# Patient Record
Sex: Female | Born: 1960 | Race: White | Hispanic: No | Marital: Single | Smoking: Former smoker
Health system: Southern US, Community
[De-identification: ages and names within clinical notes are randomized; demographics above are authoritative.]

## PROBLEM LIST (undated history)

## (undated) DIAGNOSIS — D751 Secondary polycythemia: Secondary | ICD-10-CM

## (undated) DIAGNOSIS — J449 Chronic obstructive pulmonary disease, unspecified: Secondary | ICD-10-CM

## (undated) DIAGNOSIS — J302 Other seasonal allergic rhinitis: Secondary | ICD-10-CM

## (undated) DIAGNOSIS — E559 Vitamin D deficiency, unspecified: Secondary | ICD-10-CM

## (undated) HISTORY — DX: Secondary polycythemia: D75.1

## (undated) HISTORY — DX: Other seasonal allergic rhinitis: J30.2

## (undated) HISTORY — DX: Vitamin D deficiency, unspecified: E55.9

---

## 2017-12-27 DIAGNOSIS — J101 Influenza due to other identified influenza virus with other respiratory manifestations: Secondary | ICD-10-CM

## 2017-12-27 HISTORY — DX: Influenza due to other identified influenza virus with other respiratory manifestations: J10.1

## 2018-01-04 ENCOUNTER — Emergency Department (HOSPITAL_COMMUNITY): Payer: Self-pay

## 2018-01-04 ENCOUNTER — Inpatient Hospital Stay (HOSPITAL_COMMUNITY)
Admission: EM | Admit: 2018-01-04 | Discharge: 2018-01-10 | DRG: 193 | Disposition: A | Discharge status: Home / Self Care | Payer: Self-pay | Attending: Internal Medicine | Admitting: Internal Medicine

## 2018-01-04 ENCOUNTER — Encounter (HOSPITAL_COMMUNITY): Payer: Self-pay | Admitting: Emergency Medicine

## 2018-01-04 ENCOUNTER — Other Ambulatory Visit: Payer: Self-pay

## 2018-01-04 DIAGNOSIS — R740 Nonspecific elevation of levels of transaminase and lactic acid dehydrogenase [LDH]: Secondary | ICD-10-CM

## 2018-01-04 DIAGNOSIS — J449 Chronic obstructive pulmonary disease, unspecified: Secondary | ICD-10-CM

## 2018-01-04 DIAGNOSIS — J1008 Influenza due to other identified influenza virus with other specified pneumonia: Principal | ICD-10-CM | POA: Diagnosis present

## 2018-01-04 DIAGNOSIS — J101 Influenza due to other identified influenza virus with other respiratory manifestations: Secondary | ICD-10-CM | POA: Diagnosis present

## 2018-01-04 DIAGNOSIS — J9621 Acute and chronic respiratory failure with hypoxia: Secondary | ICD-10-CM | POA: Diagnosis present

## 2018-01-04 DIAGNOSIS — J9601 Acute respiratory failure with hypoxia: Secondary | ICD-10-CM | POA: Diagnosis present

## 2018-01-04 DIAGNOSIS — J111 Influenza due to unidentified influenza virus with other respiratory manifestations: Secondary | ICD-10-CM

## 2018-01-04 DIAGNOSIS — J440 Chronic obstructive pulmonary disease with acute lower respiratory infection: Secondary | ICD-10-CM | POA: Diagnosis present

## 2018-01-04 DIAGNOSIS — J189 Pneumonia, unspecified organism: Secondary | ICD-10-CM

## 2018-01-04 DIAGNOSIS — J441 Chronic obstructive pulmonary disease with (acute) exacerbation: Secondary | ICD-10-CM

## 2018-01-04 DIAGNOSIS — J181 Lobar pneumonia, unspecified organism: Secondary | ICD-10-CM

## 2018-01-04 DIAGNOSIS — K8020 Calculus of gallbladder without cholecystitis without obstruction: Secondary | ICD-10-CM | POA: Diagnosis present

## 2018-01-04 DIAGNOSIS — Z87891 Personal history of nicotine dependence: Secondary | ICD-10-CM

## 2018-01-04 DIAGNOSIS — R0902 Hypoxemia: Secondary | ICD-10-CM

## 2018-01-04 DIAGNOSIS — R7401 Elevation of levels of liver transaminase levels: Secondary | ICD-10-CM

## 2018-01-04 DIAGNOSIS — R7989 Other specified abnormal findings of blood chemistry: Secondary | ICD-10-CM | POA: Diagnosis present

## 2018-01-04 DIAGNOSIS — R945 Abnormal results of liver function studies: Secondary | ICD-10-CM

## 2018-01-04 LAB — COMPREHENSIVE METABOLIC PANEL
ALT: 111 U/L — ABNORMAL HIGH (ref 14–54)
AST: 62 U/L — ABNORMAL HIGH (ref 15–41)
Albumin: 3.1 g/dL — ABNORMAL LOW (ref 3.5–5.0)
Alkaline Phosphatase: 88 U/L (ref 38–126)
Anion gap: 15 (ref 5–15)
BUN: 25 mg/dL — ABNORMAL HIGH (ref 6–20)
CO2: 29 mmol/L (ref 22–32)
Calcium: 9.5 mg/dL (ref 8.9–10.3)
Chloride: 99 mmol/L — ABNORMAL LOW (ref 101–111)
Creatinine, Ser: 0.7 mg/dL (ref 0.44–1.00)
GFR calc Af Amer: >60 mL/min (ref >60)
GFR calc non Af Amer: >60 mL/min (ref >60)
Glucose, Bld: 117 mg/dL — ABNORMAL HIGH (ref 65–99)
Potassium: 4 mmol/L (ref 3.5–5.1)
Sodium: 143 mmol/L (ref 135–145)
Total Bilirubin: 0.8 mg/dL (ref 0.3–1.2)
Total Protein: 8.1 g/dL (ref 6.5–8.1)

## 2018-01-04 LAB — CBC WITH DIFFERENTIAL/PLATELET
Basophils Absolute: 0.2 10*3/uL — ABNORMAL HIGH (ref 0.0–0.1)
Basophils Relative: 1 %
Eosinophils Absolute: 0 10*3/uL (ref 0.0–0.7)
Eosinophils Relative: 0 %
HCT: 46.6 % — ABNORMAL HIGH (ref 36.0–46.0)
Hemoglobin: 15.1 g/dL — ABNORMAL HIGH (ref 12.0–15.0)
Lymphocytes Relative: 21 %
Lymphs Abs: 3.6 10*3/uL (ref 0.7–4.0)
MCH: 32.1 pg (ref 26.0–34.0)
MCHC: 32.4 g/dL (ref 30.0–36.0)
MCV: 98.9 fL (ref 78.0–100.0)
Monocytes Absolute: 1.2 10*3/uL — ABNORMAL HIGH (ref 0.1–1.0)
Monocytes Relative: 7 %
Neutro Abs: 12.1 10*3/uL — ABNORMAL HIGH (ref 1.7–7.7)
Neutrophils Relative %: 71 %
Platelets: 267 10*3/uL (ref 150–400)
RBC: 4.71 MIL/uL (ref 3.87–5.11)
RDW: 12.4 % (ref 11.5–15.5)
WBC: 17.1 10*3/uL — ABNORMAL HIGH (ref 4.0–10.5)

## 2018-01-04 LAB — BLOOD GAS, ARTERIAL
Acid-Base Excess: 6 mmol/L — ABNORMAL HIGH (ref 0.0–2.0)
Bicarbonate: 29 mmol/L — ABNORMAL HIGH (ref 20.0–28.0)
Drawn by: 234301
FIO2: 21
O2 Saturation: 81.7 %
Patient temperature: 37
pCO2 arterial: 45.2 mmHg (ref 32.0–48.0)
pH, Arterial: 7.44 (ref 7.350–7.450)
pO2, Arterial: 47.1 mmHg — ABNORMAL LOW (ref 83.0–108.0)

## 2018-01-04 LAB — URINALYSIS, ROUTINE W REFLEX MICROSCOPIC
Bilirubin Urine: NEGATIVE
Glucose, UA: NEGATIVE mg/dL
Hgb urine dipstick: NEGATIVE
Ketones, ur: 20 mg/dL — AB
Leukocytes, UA: NEGATIVE
Nitrite: NEGATIVE
Protein, ur: 30 mg/dL — AB
Specific Gravity, Urine: 1.023 (ref 1.005–1.030)
pH: 6 (ref 5.0–8.0)

## 2018-01-04 LAB — I-STAT CG4 LACTIC ACID, ED: Lactic Acid, Venous: 1.13 mmol/L (ref 0.5–1.9)

## 2018-01-04 LAB — INFLUENZA PANEL BY PCR (TYPE A & B)
Influenza A By PCR: NEGATIVE
Influenza B By PCR: POSITIVE — AB

## 2018-01-04 MED ORDER — ALBUTEROL SULFATE (2.5 MG/3ML) 0.083% IN NEBU
2.5000 mg | INHALATION_SOLUTION | Freq: Once | RESPIRATORY_TRACT | Status: AC
  Administered 2018-01-04: 2.5 mg via RESPIRATORY_TRACT

## 2018-01-04 MED ORDER — SODIUM CHLORIDE 0.9 % IV BOLUS (SEPSIS)
1000.0000 mL | Freq: Once | INTRAVENOUS | Status: AC
  Administered 2018-01-04: 1000 mL via INTRAVENOUS

## 2018-01-04 MED ORDER — SODIUM CHLORIDE 0.9% FLUSH
3.0000 mL | Freq: Two times a day (BID) | INTRAVENOUS | Status: DC
  Administered 2018-01-04 – 2018-01-10 (×10): 3 mL via INTRAVENOUS

## 2018-01-04 MED ORDER — SODIUM CHLORIDE 0.9 % IV SOLN: 1000.0000 mL | INTRAVENOUS | Status: DC

## 2018-01-04 MED ORDER — DEXTROSE 5 % IV SOLN
500.0000 mg | Freq: Once | INTRAVENOUS | Status: AC
  Administered 2018-01-04: 500 mg via INTRAVENOUS
  Filled 2018-01-04: qty 500

## 2018-01-04 MED ORDER — IPRATROPIUM-ALBUTEROL 0.5-2.5 (3) MG/3ML IN SOLN
RESPIRATORY_TRACT | Status: AC
  Administered 2018-01-04: 3 mL via RESPIRATORY_TRACT
  Filled 2018-01-04: qty 3

## 2018-01-04 MED ORDER — ALBUTEROL SULFATE (2.5 MG/3ML) 0.083% IN NEBU
INHALATION_SOLUTION | RESPIRATORY_TRACT | Status: AC
  Administered 2018-01-04: 2.5 mg via RESPIRATORY_TRACT
  Filled 2018-01-04: qty 3

## 2018-01-04 MED ORDER — DEXTROSE 5 % IV SOLN
500.0000 mg | INTRAVENOUS | Status: DC
  Administered 2018-01-05 – 2018-01-07 (×3): 500 mg via INTRAVENOUS
  Filled 2018-01-04 (×4): qty 500

## 2018-01-04 MED ORDER — SODIUM CHLORIDE 0.9% FLUSH: 3.0000 mL | INTRAVENOUS | Status: DC | PRN

## 2018-01-04 MED ORDER — IPRATROPIUM-ALBUTEROL 0.5-2.5 (3) MG/3ML IN SOLN
3.0000 mL | Freq: Once | RESPIRATORY_TRACT | Status: AC
  Administered 2018-01-04: 3 mL via RESPIRATORY_TRACT

## 2018-01-04 MED ORDER — DEXTROSE 5 % IV SOLN: 500.0000 mg | INTRAVENOUS | Status: DC

## 2018-01-04 MED ORDER — IPRATROPIUM-ALBUTEROL 0.5-2.5 (3) MG/3ML IN SOLN
3.0000 mL | Freq: Four times a day (QID) | RESPIRATORY_TRACT | Status: DC
  Administered 2018-01-04 – 2018-01-09 (×19): 3 mL via RESPIRATORY_TRACT
  Filled 2018-01-04 (×18): qty 3

## 2018-01-04 MED ORDER — DEXTROSE 5 % IV SOLN
1.0000 g | Freq: Once | INTRAVENOUS | Status: AC
  Administered 2018-01-04: 1 g via INTRAVENOUS
  Filled 2018-01-04: qty 10

## 2018-01-04 MED ORDER — SODIUM CHLORIDE 0.9 % IV SOLN: 250.0000 mL | INTRAVENOUS | Status: DC | PRN

## 2018-01-04 MED ORDER — ACETAMINOPHEN 325 MG PO TABS
650.0000 mg | ORAL_TABLET | Freq: Four times a day (QID) | ORAL | Status: DC | PRN
  Administered 2018-01-04 – 2018-01-10 (×15): 650 mg via ORAL
  Filled 2018-01-04 (×15): qty 2

## 2018-01-04 MED ORDER — DEXTROSE 5 % IV SOLN
1.0000 g | INTRAVENOUS | Status: DC
  Administered 2018-01-05: 1 g via INTRAVENOUS
  Filled 2018-01-04 (×2): qty 10

## 2018-01-04 NOTE — ED Provider Notes (Signed)
[facility] EMERGENCY DEPARTMENT Provider Note   CSN: [id number] Arrival date & time: 01/04/18  1358     History   Chief Complaint Chief Complaint  Patient presents with  . Cough    HPI Emily Fitzgerald is a 57 y.o. female.  Patient presents with flulike symptoms.  Cough intermittent fever body aches.  Decreased appetite.  No nausea vomiting or diarrhea.  No sore throat.  Patient did not have the flu vaccine.  Patient not on oxygen at home.  Symptoms been present for a week.  Cough is occasionally productive but more clear sputum.  Patient hypoxic on presentation.  Oxygen saturations on room air were 82%.  Patient started on 4 L nasal cannula oxygen.      History reviewed. No pertinent past medical history.  Patient Active Problem List   Diagnosis Date Noted  . Community acquired pneumonia of left lower lobe of lung ([place]) 01/04/2018  . Acute respiratory failure with hypoxia ([place]) 01/04/2018  . Elevated LFTs 01/04/2018    Past Surgical History:  Procedure Laterality Date  . CESAREAN SECTION      OB History    No data available       Home Medications    Prior to Admission medications   Medication Sig Start Date End Date Taking? Authorizing Provider  acetaminophen (TYLENOL) 325 MG tablet Take 650 mg by mouth every 6 (six) hours as needed.   Yes [provider]  ibuprofen (ADVIL,MOTRIN) 200 MG tablet Take 200 mg by mouth every 6 (six) hours as needed.   Yes [provider]    Family History History reviewed. No pertinent family history.  Social History Social History   Tobacco Use  . Smoking status: Former Research scientist (life sciences)  . Smokeless tobacco: Never Used  Substance Use Topics  . Alcohol use: No    Frequency: Never  . Drug use: No     Allergies   Patient has no allergy information on record.   Review of Systems Review of Systems  Constitutional: Positive for fever.  HENT: Positive for congestion.   Eyes: Negative for redness.  Respiratory:  Positive for cough and shortness of breath.   Cardiovascular: Negative for chest pain.  Gastrointestinal: Negative for abdominal pain, diarrhea, nausea and vomiting.  Genitourinary: Negative for dysuria.  Musculoskeletal: Positive for myalgias.  Skin: Negative for rash.  Neurological: Negative for headaches.  Hematological: Does not bruise/bleed easily.  Psychiatric/Behavioral: Negative for confusion.     Physical Exam Updated Vital Signs BP 108/79   Pulse (!) 112   Temp 98.9 F (37.2 C) (Oral)   Resp (!) 21   Ht 1.676 m ('5\' 6"' )   Wt 63.5 kg (140 lb)   SpO2 91%   BMI 22.60 kg/m   Physical Exam  Constitutional: She is oriented to person, place, and time. She appears well-developed and well-nourished. No distress.  HENT:  Head: Normocephalic and atraumatic.  Eyes: Conjunctivae and EOM are normal. Pupils are equal, round, and reactive to light.  Neck: Neck supple.  Cardiovascular: Regular rhythm and normal heart sounds.  Tachycardic  Pulmonary/Chest: Effort normal. No respiratory distress. She has wheezes.  Increased respiratory rate  Abdominal: Soft. Bowel sounds are normal. There is no tenderness.  Musculoskeletal: Normal range of motion. She exhibits no edema.  Neurological: She is alert and oriented to person, place, and time. No cranial nerve deficit or sensory deficit. She exhibits normal muscle tone. Coordination normal.  Skin: Skin is warm.  Nursing note and vitals reviewed.  ED Treatments / Results  Labs (all labs ordered are listed, but only abnormal results are displayed) Labs Reviewed  COMPREHENSIVE METABOLIC PANEL - Abnormal; Notable for the following components:      Result Value   Chloride 99 (*)    Glucose, Bld 117 (*)    BUN 25 (*)    Albumin 3.1 (*)    AST 62 (*)    ALT 111 (*)    All other components within normal limits  CBC WITH DIFFERENTIAL/PLATELET - Abnormal; Notable for the following components:   WBC 17.1 (*)    Hemoglobin 15.1 (*)      HCT 46.6 (*)    Neutro Abs 12.1 (*)    Monocytes Absolute 1.2 (*)    Basophils Absolute 0.2 (*)    All other components within normal limits  BLOOD GAS, ARTERIAL - Abnormal; Notable for the following components:   pO2, Arterial 47.1 (*)    Bicarbonate 29.0 (*)    Acid-Base Excess 6.0 (*)    All other components within normal limits  CULTURE, BLOOD (ROUTINE X 2)  CULTURE, BLOOD (ROUTINE X 2)  CULTURE, BLOOD (ROUTINE X 2)  CULTURE, BLOOD (ROUTINE X 2)  CULTURE, EXPECTORATED SPUTUM-ASSESSMENT  GRAM STAIN  INFLUENZA PANEL BY PCR (TYPE A & B)  HIV ANTIBODY (ROUTINE TESTING)  STREP PNEUMONIAE URINARY ANTIGEN  COMPREHENSIVE METABOLIC PANEL  CBC WITH DIFFERENTIAL/PLATELET  I-STAT CG4 LACTIC ACID, ED  I-STAT CG4 LACTIC ACID, ED    EKG  EKG Interpretation  Date/Time:  Saturday January 04 2018 14:28:27 EST Ventricular Rate:  114 PR Interval:  130 QRS Duration: 78 QT Interval:  306 QTC Calculation: 421 R Axis:   -59 Text Interpretation:  Sinus tachycardia Left axis deviation Septal infarct , age undetermined Abnormal ECG No previous ECGs available Confirmed by Fredia Sorrow [phone]) on 01/04/2018 4:31:34 PM       Radiology Dg Chest 2 View  Result Date: 01/04/2018 CLINICAL DATA:  Cough and fever for several days. EXAM: CHEST  2 VIEW COMPARISON:  None. FINDINGS: The cardiomediastinal silhouette is unremarkable. Mild patchy left lower lung opacity is suspicious for pneumonia. There is no evidence of pulmonary edema, suspicious pulmonary nodule/mass, pleural effusion, or pneumothorax. No acute bony abnormalities are identified. IMPRESSION: Mild patchy opacities within the left lower lung suspicious for pneumonia. Electronically Signed   By: Margarette Canada M.D.   On: 01/04/2018 14:49    Procedures Procedures (including critical care time)  CRITICAL CARE Performed by: Fredia Sorrow Total critical care time: 30 minutes Critical care time was exclusive of separately billable  procedures and treating other patients. Critical care was necessary to treat or prevent imminent or life-threatening deterioration. Critical care was time spent personally by me on the following activities: development of treatment plan with patient and/or surrogate as well as nursing, discussions with consultants, evaluation of patient's response to treatment, examination of patient, obtaining history from patient or surrogate, ordering and performing treatments and interventions, ordering and review of laboratory studies, ordering and review of radiographic studies, pulse oximetry and re-evaluation of patient's condition.   Medications Ordered in ED Medications  acetaminophen (TYLENOL) tablet 650 mg (not administered)  sodium chloride flush (NS) 0.9 % injection 3 mL (not administered)  sodium chloride flush (NS) 0.9 % injection 3 mL (not administered)  0.9 %  sodium chloride infusion (not administered)  cefTRIAXone (ROCEPHIN) 1 g in dextrose 5 % 50 mL IVPB (not administered)  azithromycin (ZITHROMAX) 500 mg in dextrose 5 % 250 mL IVPB (  not administered)  sodium chloride 0.9 % bolus 1,000 mL (1,000 mLs Intravenous New Bag/Given 01/04/18 1623)    And  sodium chloride 0.9 % bolus 1,000 mL (1,000 mLs Intravenous New Bag/Given 01/04/18 1642)  cefTRIAXone (ROCEPHIN) 1 g in dextrose 5 % 50 mL IVPB (0 g Intravenous Stopped 01/04/18 1725)  azithromycin (ZITHROMAX) 500 mg in dextrose 5 % 250 mL IVPB (0 mg Intravenous Stopped 01/04/18 1831)  ipratropium-albuterol (DUONEB) 0.5-2.5 (3) MG/3ML nebulizer solution 3 mL (3 mLs Nebulization Given 01/04/18 1613)  albuterol (PROVENTIL) (2.5 MG/3ML) 0.083% nebulizer solution 2.5 mg (2.5 mg Nebulization Given 01/04/18 1612)     Initial Impression / Assessment and Plan / ED Course  I have reviewed the triage vital signs and the nursing notes.  Pertinent labs & imaging results that were available during my care of the patient were reviewed by me and considered in my medical  decision making (see chart for details).     Patient upon presentation clearly hypoxic.  On 4 L of oxygen satting around percent.  Did have some slight wheezing.  Received nebulizer treatment.  Patient had blood gas done off oxygen which shows that she clearly was hypoxic but not retaining and not acidotic.  Symptoms seem to be flulike in nature flu test sent.  Based on her increased respiratory rate and heart rate patient was initially started on sepsis protocol.  Patient did receive 2 L of normal saline for the tachycardia and because she appeared dry.  As it turns out this met her 30 cc/kg bolus however she did not have an elevated lactic acid and never was hypotensive.  But it did make her feel better.  Blood cultures are done and pending.  Suspect this is influenza.  Chest x-ray consistent with left lower lobe pneumonia.  Patient started on community-acquired antibiotics per protocol for this.  She received Rocephin and Zithromax.  Wheezing resolved after the breathing treatment.  Heart rate still up in the low 100s but patient overall improved from when she arrived.  Labs showed a significant leukocytosis.  Blood gases stated consistent with hypoxia.  This was on room air.  Electrolytes without significant abnormalities.  Final Clinical Impressions(s) / ED Diagnoses   Final diagnoses:  Community acquired pneumonia of left lower lobe of lung [facility])  Hypoxia    ED Discharge Orders    None       Fredia Sorrow, MD 01/04/18 [phone]

## 2018-01-04 NOTE — ED Notes (Signed)
Pt in x-ray at this time

## 2018-01-04 NOTE — ED Notes (Signed)
Pt placed on 2 liters. o2 saturation remained 89%. Pt o2 increased to 3 liters, pt is 91% on 3 liters.

## 2018-01-04 NOTE — ED Triage Notes (Signed)
Pt reports cough,intermittent fever, and generalized body aches for last several days with decreased appetite. Pt denies v/d.

## 2018-01-04 NOTE — H&P (Signed)
History and Physical    Emily Fitzgerald [id number] DOB: 1961/11/05 DOA: 01/04/2018  PCP: Patient, No Pcp Per  Patient coming from: Home  Chief Complaint: Shortness of breath and cough  HPI: Emily Fitzgerald is a 57 y.o. female with medical history significant of nothing comes in with 1 week of progressive worsening shortness of breath and cough.  Has been having subjective fevers and chills.  She is been wheezing also.  She is a smoker but quit a month ago.  She denies any chest pain.  She denies any lower extremity edema or swelling.  Patient found to have pneumonia and acute hypoxia with O2 sats in the mid 80s.  She is referred for admission for pneumonia with hypoxia.  She does not require home oxygen supplementation.  Review of Systems: As per HPI otherwise 10 point review of systems negative.   History reviewed. No pertinent past medical history.  None  Past Surgical History:  Procedure Laterality Date  . CESAREAN SECTION       reports that she has quit smoking. she has never used smokeless tobacco. She reports that she does not drink alcohol or use drugs.  Not on File  History reviewed. No pertinent family history.  No premature coronary artery disease  Prior to Admission medications   Medication Sig Start Date End Date Taking? Authorizing Provider  acetaminophen (TYLENOL) 325 MG tablet Take 650 mg by mouth every 6 (six) hours as needed.   Yes [provider]  ibuprofen (ADVIL,MOTRIN) 200 MG tablet Take 200 mg by mouth every 6 (six) hours as needed.   Yes [provider]    Physical Exam: Vitals:   01/04/18 1700 01/04/18 1730 01/04/18 1800 01/04/18 1832  BP: 138/65 128/82 138/81 108/79  Pulse:    (!) 112  Resp:      Temp:      TempSrc:      SpO2:    91%  Weight:      Height:          Constitutional: NAD, calm, comfortable Vitals:   01/04/18 1700 01/04/18 1730 01/04/18 1800 01/04/18 1832  BP: 138/65 128/82 138/81 108/79  Pulse:    (!) 112  Resp:       Temp:      TempSrc:      SpO2:    91%  Weight:      Height:       Eyes: PERRL, lids and conjunctivae normal ENMT: Mucous membranes are moist. Posterior pharynx clear of any exudate or lesions.Normal dentition.  Neck: normal, supple, no masses, no thyromegaly Respiratory: clear to auscultation bilaterally, mild expiratory wheezing, no crackles. Normal respiratory effort. No accessory muscle use.  Cardiovascular: Regular rate and rhythm, no murmurs / rubs / gallops. No extremity edema. 2+ pedal pulses. No carotid bruits.  Abdomen: no tenderness, no masses palpated. No hepatosplenomegaly. Bowel sounds positive.  Musculoskeletal: no clubbing / cyanosis. No joint deformity upper and lower extremities. Good ROM, no contractures. Normal muscle tone.  Skin: no rashes, lesions, ulcers. No induration Neurologic: CN 2-12 grossly intact. Sensation intact, DTR normal. Strength 5/5 in all 4.  Psychiatric: Normal judgment and insight. Alert and oriented x 3. Normal mood.    Labs on Admission: I have personally reviewed following labs and imaging studies  CBC: Recent Labs  Lab 01/04/18 1633  WBC 17.1*  NEUTROABS 12.1*  HGB 15.1*  HCT 46.6*  MCV 98.9  PLT 272   Basic Metabolic Panel: Recent Labs  Lab 01/04/18 1633  NA 143  K 4.0  CL 99*  CO2 29  GLUCOSE 117*  BUN 25*  CREATININE 0.70  CALCIUM 9.5   GFR: Estimated Creatinine Clearance: 75.3 mL/min (by C-G formula based on SCr of 0.7 mg/dL). Liver Function Tests: Recent Labs  Lab 01/04/18 1633  AST 62*  ALT 111*  ALKPHOS 88  BILITOT 0.8  PROT 8.1  ALBUMIN 3.1*   No results for input(s): LIPASE, AMYLASE in the last 168 hours. No results for input(s): AMMONIA in the last 168 hours. Coagulation Profile: No results for input(s): INR, PROTIME in the last 168 hours. Cardiac Enzymes: No results for input(s): CKTOTAL, CKMB, CKMBINDEX, TROPONINI in the last 168 hours. BNP (last 3 results) No results for input(s): PROBNP in the  last 8760 hours. HbA1C: No results for input(s): HGBA1C in the last 72 hours. CBG: No results for input(s): GLUCAP in the last 168 hours. Lipid Profile: No results for input(s): CHOL, HDL, LDLCALC, TRIG, CHOLHDL, LDLDIRECT in the last 72 hours. Thyroid Function Tests: No results for input(s): TSH, T4TOTAL, FREET4, T3FREE, THYROIDAB in the last 72 hours. Anemia Panel: No results for input(s): VITAMINB12, FOLATE, FERRITIN, TIBC, IRON, RETICCTPCT in the last 72 hours. Urine analysis: No results found for: COLORURINE, APPEARANCEUR, LABSPEC, PHURINE, GLUCOSEU, HGBUR, BILIRUBINUR, [place], [place], UROBILINOGEN, NITRITE, LEUKOCYTESUR Sepsis Labs: !!!!!!!!!!!!!!!!!!!!!!!!!!!!!!!!!!!!!!!!!!!! @LABRCNTIP (procalcitonin:4,lacticidven:4) ) Recent Results (from the past 240 hour(s))  Blood Culture (routine x 2)     Status: None (Preliminary result)   Collection Time: 01/04/18  4:33 PM  Result Value Ref Range Status   Specimen Description LEFT ANTECUBITAL BOTTLES DRAWN AEROBIC ONLY  Final   Special Requests   Final    Blood Culture adequate volume Performed at [facility], [address], [place], [place] [address]    Culture PENDING  Incomplete   Report Status PENDING  Incomplete  Blood Culture (routine x 2)     Status: None (Preliminary result)   Collection Time: 01/04/18  4:34 PM  Result Value Ref Range Status   Specimen Description BLOOD LEFT ARM BOTTLES DRAWN AEROBIC ONLY  Final   Special Requests   Final    Blood Culture adequate volume Performed at [facility], [address] [place], [place] [address]    Culture PENDING  Incomplete   Report Status PENDING  Incomplete     Radiological Exams on Admission: Dg Chest 2 View  Result Date: 01/04/2018 CLINICAL DATA:  Cough and fever for several days. EXAM: CHEST  2 VIEW COMPARISON:  None. FINDINGS: The cardiomediastinal silhouette is unremarkable. Mild patchy left lower lung opacity is suspicious for pneumonia. There is no evidence of  pulmonary edema, suspicious pulmonary nodule/mass, pleural effusion, or pneumothorax. No acute bony abnormalities are identified. IMPRESSION: Mild patchy opacities within the left lower lung suspicious for pneumonia. Electronically Signed   By: Margarette Canada M.D.   On: 01/04/2018 14:49      Assessment/Plan 57 year old female with [facility] pneumonia with acute hypoxic respiratory failure with hypoxemia Principal Problem:   PNA (pneumonia)-placed on IV Rocephin and azithromycin.  Obtain blood cultures and sputum cultures.  Check influenza.  Supplemental oxygen as needed.  Active Problems:   Acute respiratory failure with hypoxia (HCC)-due to pneumonia.  Placed on duo nebs.  Treat with antibiotics.  Will hold off on steroids at this time.   Elevated LFTs-repeat LFTs in the morning.   check acute hepatitis panel.      DVT prophylaxis: SCDs Code Status: Full Family Communication: Husband Disposition Plan: Per day team Consults  called: None Admission status: Admission   Coren Crownover A MD [facility]  If 7PM-7AM, please contact night-coverage [email or web address] Password [facility]  01/04/2018, 6:45 PM

## 2018-01-04 NOTE — ED Notes (Signed)
Call for report  RN will call back 

## 2018-01-05 DIAGNOSIS — J101 Influenza due to other identified influenza virus with other respiratory manifestations: Secondary | ICD-10-CM | POA: Diagnosis present

## 2018-01-05 LAB — CBC WITH DIFFERENTIAL/PLATELET
Basophils Absolute: 0.1 10*3/uL (ref 0.0–0.1)
Basophils Relative: 1 %
Eosinophils Absolute: 0 10*3/uL (ref 0.0–0.7)
Eosinophils Relative: 0 %
HCT: 42.3 % (ref 36.0–46.0)
Hemoglobin: 13 g/dL (ref 12.0–15.0)
Lymphocytes Relative: 28 %
Lymphs Abs: 3.8 10*3/uL (ref 0.7–4.0)
MCH: 30.5 pg (ref 26.0–34.0)
MCHC: 30.7 g/dL (ref 30.0–36.0)
MCV: 99.3 fL (ref 78.0–100.0)
Monocytes Absolute: 1.3 10*3/uL — ABNORMAL HIGH (ref 0.1–1.0)
Monocytes Relative: 10 %
Neutro Abs: 8.2 10*3/uL — ABNORMAL HIGH (ref 1.7–7.7)
Neutrophils Relative %: 61 %
Platelets: 266 10*3/uL (ref 150–400)
RBC: 4.26 MIL/uL (ref 3.87–5.11)
RDW: 12.8 % (ref 11.5–15.5)
WBC: 13.4 10*3/uL — ABNORMAL HIGH (ref 4.0–10.5)

## 2018-01-05 LAB — COMPREHENSIVE METABOLIC PANEL
ALT: 84 U/L — ABNORMAL HIGH (ref 14–54)
AST: 43 U/L — ABNORMAL HIGH (ref 15–41)
Albumin: 2.5 g/dL — ABNORMAL LOW (ref 3.5–5.0)
Alkaline Phosphatase: 75 U/L (ref 38–126)
Anion gap: 11 (ref 5–15)
BUN: 13 mg/dL (ref 6–20)
CO2: 28 mmol/L (ref 22–32)
Calcium: 8.4 mg/dL — ABNORMAL LOW (ref 8.9–10.3)
Chloride: 102 mmol/L (ref 101–111)
Creatinine, Ser: 0.5 mg/dL (ref 0.44–1.00)
GFR calc Af Amer: >60 mL/min (ref >60)
GFR calc non Af Amer: >60 mL/min (ref >60)
Glucose, Bld: 92 mg/dL (ref 65–99)
Potassium: 3.5 mmol/L (ref 3.5–5.1)
Sodium: 141 mmol/L (ref 135–145)
Total Bilirubin: 0.4 mg/dL (ref 0.3–1.2)
Total Protein: 6.5 g/dL (ref 6.5–8.1)

## 2018-01-05 LAB — STREP PNEUMONIAE URINARY ANTIGEN: Strep Pneumo Urinary Antigen: NEGATIVE

## 2018-01-05 MED ORDER — ALBUTEROL SULFATE (2.5 MG/3ML) 0.083% IN NEBU: 2.5000 mg | INHALATION_SOLUTION | RESPIRATORY_TRACT | Status: DC | PRN

## 2018-01-05 MED ORDER — ONDANSETRON HCL 4 MG/2ML IJ SOLN: 4.0000 mg | Freq: Four times a day (QID) | INTRAMUSCULAR | Status: DC | PRN

## 2018-01-05 MED ORDER — ENOXAPARIN SODIUM 40 MG/0.4ML ~~LOC~~ SOLN
40.0000 mg | SUBCUTANEOUS | Status: DC
  Administered 2018-01-05 – 2018-01-09 (×4): 40 mg via SUBCUTANEOUS
  Filled 2018-01-05 (×4): qty 0.4

## 2018-01-05 MED ORDER — GUAIFENESIN-DM 100-10 MG/5ML PO SYRP
5.0000 mL | ORAL_SOLUTION | ORAL | Status: DC | PRN
  Administered 2018-01-05 – 2018-01-10 (×17): 5 mL via ORAL
  Filled 2018-01-05 (×17): qty 5

## 2018-01-05 MED ORDER — ORAL CARE MOUTH RINSE
15.0000 mL | Freq: Two times a day (BID) | OROMUCOSAL | Status: DC
  Administered 2018-01-05 – 2018-01-09 (×7): 15 mL via OROMUCOSAL

## 2018-01-05 MED ORDER — GUAIFENESIN ER 600 MG PO TB12
1200.0000 mg | ORAL_TABLET | Freq: Two times a day (BID) | ORAL | Status: DC
  Administered 2018-01-05 – 2018-01-10 (×10): 1200 mg via ORAL
  Filled 2018-01-05 (×10): qty 2

## 2018-01-05 MED ORDER — BENZONATATE 100 MG PO CAPS
200.0000 mg | ORAL_CAPSULE | Freq: Three times a day (TID) | ORAL | Status: DC
  Administered 2018-01-05 – 2018-01-10 (×13): 200 mg via ORAL
  Filled 2018-01-05 (×13): qty 2

## 2018-01-05 MED ORDER — OSELTAMIVIR PHOSPHATE 75 MG PO CAPS
75.0000 mg | ORAL_CAPSULE | Freq: Two times a day (BID) | ORAL | Status: AC
  Administered 2018-01-05 – 2018-01-09 (×10): 75 mg via ORAL
  Filled 2018-01-05 (×10): qty 1

## 2018-01-05 NOTE — Progress Notes (Signed)
PROGRESS NOTE    Emily Fitzgerald  WUJ:[id number] DOB: 1961-10-11 DOA: 01/04/2018 PCP: Patient, No Pcp Per    Brief Narrative:  57 year old female with no significant past medical history admitted to the hospital with progressive shortness of breath and cough.  Found to have evidence of pneumonia on chest x-ray and also tested positive for influenza B.  On antibiotics and Tamiflu.  Continue pulmonary hygiene and wean off oxygen as tolerated.   Assessment & Plan:   Principal Problem:   Community acquired pneumonia of left lower lobe of lung ([place]) Active Problems:   Acute respiratory failure with hypoxia (HCC)   Elevated LFTs   Influenza B   1. Acute respiratory failure with hypoxia.  Related to pneumonia.  Currently on 5 L of oxygen.  Wean off oxygen as tolerated. 2. Community acquired pneumonia.  Patient found to have evidence of left lower lobe pneumonia on chest x-ray.  She is been started on ceftriaxone and azithromycin.  Continue current treatments with pulmonary hygiene. 3. Influenza B.  Patient started on Tamiflu 4. Elevated LFTs.  Hepatitis panel has been sent, although this is likely reactive.  She does not have any abdominal pain, nausea or vomiting.   DVT prophylaxis: (Lovenox/Heparin/SCD's/anticoagulated/None (if comfort care) Code Status: Full code Family Communication: Discussed with family at the bedside Disposition Plan: Discharge home once improved.   Consultants:     Procedures:     Antimicrobials:   Ceftriaxone 2/9 >  Azithromycin 2/9 >   Subjective: Feels unwell.  Continues to have cough which is nonproductive.  Still feels short of breath.  No nausea or vomiting.  Objective: Vitals:   01/05/18 0545 01/05/18 0803 01/05/18 1414 01/05/18 1746  BP: 125/68   121/66  Pulse: 93   84  Resp: 20   20  Temp: 98.2 F (36.8 C)   97.8 F (36.6 C)  TempSrc: Oral   Oral  SpO2: 93% 95% 94% 95%  Weight:      Height:        Intake/Output Summary (Last 24  hours) at 01/05/2018 1833 Last data filed at 01/05/2018 1700 Gross per 24 hour  Intake 723 ml  Output 400 ml  Net 323 ml   Filed Weights   01/04/18 1419 01/04/18 2030  Weight: 63.5 kg (140 lb) 97.6 kg (215 lb 2.7 oz)    Examination:  General exam: Appears calm and comfortable, mildly diaphoretic Respiratory system: Bilateral rhonchi. Respiratory effort normal. Cardiovascular system: S1 & S2 heard, RRR. No JVD, murmurs, rubs, gallops or clicks. No pedal edema. Gastrointestinal system: Abdomen is nondistended, soft and nontender. No organomegaly or masses felt. Normal bowel sounds heard. Central nervous system: Alert and oriented. No focal neurological deficits. Extremities: Symmetric 5 x 5 power. Skin: No rashes, lesions or ulcers Psychiatry: Judgement and insight appear normal. Mood & affect appropriate.     Data Reviewed: I have personally reviewed following labs and imaging studies  CBC: Recent Labs  Lab 01/04/18 1633 01/05/18 0552  WBC 17.1* 13.4*  NEUTROABS 12.1* 8.2*  HGB 15.1* 13.0  HCT 46.6* 42.3  MCV 98.9 99.3  PLT 267 956   Basic Metabolic Panel: Recent Labs  Lab 01/04/18 1633 01/05/18 0552  NA 143 141  K 4.0 3.5  CL 99* 102  CO2 29 28  GLUCOSE 117* 92  BUN 25* 13  CREATININE 0.70 0.50  CALCIUM 9.5 8.4*   GFR: Estimated Creatinine Clearance: 94.7 mL/min (by C-G formula based on SCr of 0.5 mg/dL). Liver Function  Tests: Recent Labs  Lab 01/04/18 1633 01/05/18 0552  AST 62* 43*  ALT 111* 84*  ALKPHOS 88 75  BILITOT 0.8 0.4  PROT 8.1 6.5  ALBUMIN 3.1* 2.5*   No results for input(s): LIPASE, AMYLASE in the last 168 hours. No results for input(s): AMMONIA in the last 168 hours. Coagulation Profile: No results for input(s): INR, PROTIME in the last 168 hours. Cardiac Enzymes: No results for input(s): CKTOTAL, CKMB, CKMBINDEX, TROPONINI in the last 168 hours. BNP (last 3 results) No results for input(s): PROBNP in the last 8760  hours. HbA1C: No results for input(s): HGBA1C in the last 72 hours. CBG: No results for input(s): GLUCAP in the last 168 hours. Lipid Profile: No results for input(s): CHOL, HDL, LDLCALC, TRIG, CHOLHDL, LDLDIRECT in the last 72 hours. Thyroid Function Tests: No results for input(s): TSH, T4TOTAL, FREET4, T3FREE, THYROIDAB in the last 72 hours. Anemia Panel: No results for input(s): VITAMINB12, FOLATE, FERRITIN, TIBC, IRON, RETICCTPCT in the last 72 hours. Sepsis Labs: Recent Labs  Lab 01/04/18 1630  LATICACIDVEN 1.13    Recent Results (from the past 240 hour(s))  Blood Culture (routine x 2)     Status: None (Preliminary result)   Collection Time: 01/04/18  4:33 PM  Result Value Ref Range Status   Specimen Description LEFT ANTECUBITAL BOTTLES DRAWN AEROBIC ONLY  Final   Special Requests Blood Culture adequate volume  Final   Culture   Final    NO GROWTH < 12 HOURS Performed at [facility], [address], [place], [place] [address]    Report Status PENDING  Incomplete  Blood Culture (routine x 2)     Status: None (Preliminary result)   Collection Time: 01/04/18  4:34 PM  Result Value Ref Range Status   Specimen Description BLOOD LEFT ARM BOTTLES DRAWN AEROBIC ONLY  Final   Special Requests Blood Culture adequate volume  Final   Culture   Final    NO GROWTH < 12 HOURS Performed at [facility], [address], [place], [place] [address]    Report Status PENDING  Incomplete         Radiology Studies: Dg Chest 2 View  Result Date: 01/04/2018 CLINICAL DATA:  Cough and fever for several days. EXAM: CHEST  2 VIEW COMPARISON:  None. FINDINGS: The cardiomediastinal silhouette is unremarkable. Mild patchy left lower lung opacity is suspicious for pneumonia. There is no evidence of pulmonary edema, suspicious pulmonary nodule/mass, pleural effusion, or pneumothorax. No acute bony abnormalities are identified. IMPRESSION: Mild patchy opacities within the left lower lung suspicious  for pneumonia. Electronically Signed   By: Margarette Canada M.D.   On: 01/04/2018 14:49        Scheduled Meds: . ipratropium-albuterol  3 mL Nebulization Q6H  . mouth rinse  15 mL Mouth Rinse BID  . oseltamivir  75 mg Oral BID  . sodium chloride flush  3 mL Intravenous Q12H   Continuous Infusions: . sodium chloride    . azithromycin 500 mg (01/05/18 1730)  . cefTRIAXone (ROCEPHIN)  IV Stopped (01/05/18 1740)     LOS: 1 day    Time spent: 25 minutes    Kathie Dike, MD [facility] Pager [phone]  If 7PM-7AM, please contact night-coverage [email or web address] Password [facility] 01/05/2018, 6:33 PM

## 2018-01-06 LAB — CBC
HCT: 41.1 % (ref 36.0–46.0)
Hemoglobin: 12.9 g/dL (ref 12.0–15.0)
MCH: 30.9 pg (ref 26.0–34.0)
MCHC: 31.4 g/dL (ref 30.0–36.0)
MCV: 98.6 fL (ref 78.0–100.0)
Platelets: 262 10*3/uL (ref 150–400)
RBC: 4.17 MIL/uL (ref 3.87–5.11)
RDW: 12.5 % (ref 11.5–15.5)
WBC: 9.2 10*3/uL (ref 4.0–10.5)

## 2018-01-06 LAB — HEPATITIS PANEL, ACUTE
HCV Ab: <0.1 s/co ratio (ref 0.0–0.9)
Hep A IgM: NEGATIVE
Hep B C IgM: NEGATIVE
Hepatitis B Surface Ag: NEGATIVE

## 2018-01-06 LAB — HIV ANTIBODY (ROUTINE TESTING W REFLEX): HIV Screen 4th Generation wRfx: NONREACTIVE

## 2018-01-06 MED ORDER — SODIUM CHLORIDE 0.9 % IV SOLN
1.0000 g | INTRAVENOUS | Status: DC
  Administered 2018-01-06 – 2018-01-08 (×3): 1 g via INTRAVENOUS
  Filled 2018-01-06 (×2): qty 10
  Filled 2018-01-06 (×2): qty 1

## 2018-01-06 NOTE — Progress Notes (Signed)
PROGRESS NOTE    Emily Fitzgerald  ZHY:[id number] DOB: 1961-08-27 DOA: 01/04/2018 PCP: Patient, No Pcp Per    Brief Narrative:  57 year old female with no significant past medical history admitted to the hospital with progressive shortness of breath and cough.  Found to have evidence of pneumonia on chest x-ray and also tested positive for influenza B.  On antibiotics and Tamiflu.  Continue pulmonary hygiene and wean off oxygen as tolerated.   Assessment & Plan:   Principal Problem:   Community acquired pneumonia of left lower lobe of lung ([place]) Active Problems:   Acute respiratory failure with hypoxia (HCC)   Elevated LFTs   Influenza B   1. Acute respiratory failure with hypoxia.  Related to pneumonia.  Oxygen has been weaned down to 4 L.  Continue to wean off as tolerated. 2. Community acquired pneumonia.  Patient found to have evidence of left lower lobe pneumonia on chest x-ray.  She is been started on ceftriaxone and azithromycin.  She appears to be slowly improving.  Continue current treatments 3. Influenza B.  Patient started on Tamiflu 4. Elevated LFTs.  Hepatitis panel is negative.  This is likely reactive to acute illness.  She does not have any abdominal pain, nausea or vomiting which makes an obstructive process unlikely.   DVT prophylaxis: Lovenox. Code Status: Full code Family Communication: Discussed with family at the bedside Disposition Plan: Discharge home once improved.   Consultants:     Procedures:     Antimicrobials:   Ceftriaxone 2/9 >  Azithromycin 2/9 >   Subjective: Feels a little better today.  Continues to have cough.  Feels that shortness of breath is improving.  Objective: Vitals:   01/06/18 0606 01/06/18 0739 01/06/18 1437 01/06/18 1602  BP: 135/78   100/82  Pulse: 79   85  Resp: 18   20  Temp: 98 F (36.7 C)   98.4 F (36.9 C)  TempSrc: Oral   Oral  SpO2: 95% 90% 92% (!) 85%  Weight:      Height:        Intake/Output Summary  (Last 24 hours) at 01/06/2018 1901 Last data filed at 01/06/2018 1828 Gross per 24 hour  Intake 1370 ml  Output 400 ml  Net 970 ml   Filed Weights   01/04/18 1419 01/04/18 2030  Weight: 63.5 kg (140 lb) 97.6 kg (215 lb 2.7 oz)    Examination:  General exam: Alert, awake, oriented x 3 Respiratory system: Bilateral rhonchi, no wheezing. Respiratory effort normal. Cardiovascular system:RRR. No murmurs, rubs, gallops. Gastrointestinal system: Abdomen is nondistended, soft and nontender. No organomegaly or masses felt. Normal bowel sounds heard. Central nervous system: Alert and oriented. No focal neurological deficits. Extremities: No C/C/E, +pedal pulses Skin: No rashes, lesions or ulcers Psychiatry: Judgement and insight appear normal. Mood & affect appropriate.      Data Reviewed: I have personally reviewed following labs and imaging studies  CBC: Recent Labs  Lab 01/04/18 1633 01/05/18 0552 01/06/18 0508  WBC 17.1* 13.4* 9.2  NEUTROABS 12.1* 8.2*  --   HGB 15.1* 13.0 12.9  HCT 46.6* 42.3 41.1  MCV 98.9 99.3 98.6  PLT 267 266 295   Basic Metabolic Panel: Recent Labs  Lab 01/04/18 1633 01/05/18 0552  NA 143 141  K 4.0 3.5  CL 99* 102  CO2 29 28  GLUCOSE 117* 92  BUN 25* 13  CREATININE 0.70 0.50  CALCIUM 9.5 8.4*   GFR: Estimated Creatinine Clearance: 94.7 mL/min (by  C-G formula based on SCr of 0.5 mg/dL). Liver Function Tests: Recent Labs  Lab 01/04/18 1633 01/05/18 0552  AST 62* 43*  ALT 111* 84*  ALKPHOS 88 75  BILITOT 0.8 0.4  PROT 8.1 6.5  ALBUMIN 3.1* 2.5*   No results for input(s): LIPASE, AMYLASE in the last 168 hours. No results for input(s): AMMONIA in the last 168 hours. Coagulation Profile: No results for input(s): INR, PROTIME in the last 168 hours. Cardiac Enzymes: No results for input(s): CKTOTAL, CKMB, CKMBINDEX, TROPONINI in the last 168 hours. BNP (last 3 results) No results for input(s): PROBNP in the last 8760  hours. HbA1C: No results for input(s): HGBA1C in the last 72 hours. CBG: No results for input(s): GLUCAP in the last 168 hours. Lipid Profile: No results for input(s): CHOL, HDL, LDLCALC, TRIG, CHOLHDL, LDLDIRECT in the last 72 hours. Thyroid Function Tests: No results for input(s): TSH, T4TOTAL, FREET4, T3FREE, THYROIDAB in the last 72 hours. Anemia Panel: No results for input(s): VITAMINB12, FOLATE, FERRITIN, TIBC, IRON, RETICCTPCT in the last 72 hours. Sepsis Labs: Recent Labs  Lab 01/04/18 1630  LATICACIDVEN 1.13    Recent Results (from the past 240 hour(s))  Blood Culture (routine x 2)     Status: None (Preliminary result)   Collection Time: 01/04/18  4:33 PM  Result Value Ref Range Status   Specimen Description LEFT ANTECUBITAL BOTTLES DRAWN AEROBIC ONLY  Final   Special Requests Blood Culture adequate volume  Final   Culture   Final    NO GROWTH 2 DAYS Performed at [facility], [address] [place], [place] [address]    Report Status PENDING  Incomplete  Blood Culture (routine x 2)     Status: None (Preliminary result)   Collection Time: 01/04/18  4:34 PM  Result Value Ref Range Status   Specimen Description BLOOD LEFT ARM BOTTLES DRAWN AEROBIC ONLY  Final   Special Requests Blood Culture adequate volume  Final   Culture   Final    NO GROWTH 2 DAYS Performed at [facility], [address], [place], [place] [address]    Report Status PENDING  Incomplete         Radiology Studies: No results found.      Scheduled Meds: . benzonatate  200 mg Oral TID  . enoxaparin (LOVENOX) injection  40 mg Subcutaneous Q24H  . guaiFENesin  1,200 mg Oral BID  . ipratropium-albuterol  3 mL Nebulization Q6H  . mouth rinse  15 mL Mouth Rinse BID  . oseltamivir  75 mg Oral BID  . sodium chloride flush  3 mL Intravenous Q12H   Continuous Infusions: . sodium chloride    . azithromycin Stopped (01/06/18 1840)  . cefTRIAXone (ROCEPHIN)  IV Stopped (01/06/18 1707)      LOS: 2 days    Time spent: 25 minutes    Kathie Dike, MD [facility] Pager [phone]  If 7PM-7AM, please contact night-coverage [email or web address] Password [email or web address] 01/06/2018, 7:01 PM

## 2018-01-07 NOTE — Care Management Note (Signed)
Case Management Note  Patient Details  Name: Emily Fitzgerald MRN: [id number] Date of Birth: 1961/03/08  Subjective/Objective:              Admitted with flu. Pt is from home, ind with ADL's. She is uninsured, unemployed and has no PCP. Pt discharging home in next 24 hrs.       Action/Plan: Pt provided list of places to f/u in [facility] with no insurance. Pt understands she is responsible for making f/u appointments. She has been given Good[employer] card and [employer].  Expected Discharge Date:      01/07/2018            Expected Discharge Plan:  Home/Self Care  In-House Referral:  NA  Discharge planning Services  CM Consult  Post Acute Care Choice:  NA Choice offered to:  NA  Status of Service:  Completed, signed off  Sherald Barge, RN 01/07/2018, 1:04 PM

## 2018-01-07 NOTE — Progress Notes (Signed)
PROGRESS NOTE    Emily Fitzgerald  RDE:[id number] DOB: 12-Oct-1961 DOA: 01/04/2018 PCP: Patient, No Pcp Per    Brief Narrative:  57 year old female with no significant past medical history admitted to the hospital with progressive shortness of breath and cough.  Found to have evidence of pneumonia on chest x-ray and also tested positive for influenza B.  On antibiotics and Tamiflu.  She is slowly improving, but is still hypoxic on ambulation. Anticipate discharge in the next 24 hours.   Assessment & Plan:   Principal Problem:   Community acquired pneumonia of left lower lobe of lung ([place]) Active Problems:   Acute respiratory failure with hypoxia (HCC)   Elevated LFTs   Influenza B   1. Acute respiratory failure with hypoxia.  Related to pneumonia.  Although patient has been weaned off of oxygen at rest, becomes hypoxic while ambulating.  Continue current treatments. 2. Community acquired pneumonia.  Patient found to have evidence of left lower lobe pneumonia on chest x-ray.  She was started on ceftriaxone and azithromycin.  She continues to slowly improve.  Continue current treatments. 3. Influenza B.  Currently on Tamiflu 4. Elevated LFTs.  Hepatitis panel is negative.  This is likely reactive to acute illness.  She does not have any abdominal pain, nausea or vomiting which makes an obstructive process unlikely.   DVT prophylaxis: Lovenox. Code Status: Full code Family Communication: Discussed with family at the bedside Disposition Plan: Discharge home once improved.   Consultants:     Procedures:     Antimicrobials:   Ceftriaxone 2/9 >  Azithromycin 2/9 >   Subjective: Continues to have productive cough.  Wheezing is better.  Overall shortness of breath is improving.  Objective: Vitals:   01/07/18 1235 01/07/18 1240 01/07/18 1457 01/07/18 1540  BP:    140/70  Pulse:    75  Resp:    18  Temp:    98 F (36.7 C)  TempSrc:    Oral  SpO2: 91% (!) 87% 94% 92%    Weight:      Height:        Intake/Output Summary (Last 24 hours) at 01/07/2018 1734 Last data filed at 01/07/2018 1344 Gross per 24 hour  Intake 1310 ml  Output -  Net 1310 ml   Filed Weights   01/04/18 1419 01/04/18 2030  Weight: 63.5 kg (140 lb) 97.6 kg (215 lb 2.7 oz)    Examination:  General exam: Alert, awake, oriented x 3 Respiratory system: bilateral rhonchi, no wheezing. Respiratory effort normal. Cardiovascular system:RRR. No murmurs, rubs, gallops. Gastrointestinal system: Abdomen is nondistended, soft and nontender. No organomegaly or masses felt. Normal bowel sounds heard. Central nervous system: Alert and oriented. No focal neurological deficits. Extremities: No C/C/E, +pedal pulses Skin: No rashes, lesions or ulcers Psychiatry: Judgement and insight appear normal. Mood & affect appropriate.   Data Reviewed: I have personally reviewed following labs and imaging studies  CBC: Recent Labs  Lab 01/04/18 1633 01/05/18 0552 01/06/18 0508  WBC 17.1* 13.4* 9.2  NEUTROABS 12.1* 8.2*  --   HGB 15.1* 13.0 12.9  HCT 46.6* 42.3 41.1  MCV 98.9 99.3 98.6  PLT 267 266 631   Basic Metabolic Panel: Recent Labs  Lab 01/04/18 1633 01/05/18 0552  NA 143 141  K 4.0 3.5  CL 99* 102  CO2 29 28  GLUCOSE 117* 92  BUN 25* 13  CREATININE 0.70 0.50  CALCIUM 9.5 8.4*   GFR: Estimated Creatinine Clearance: 94.7 mL/min (by  C-G formula based on SCr of 0.5 mg/dL). Liver Function Tests: Recent Labs  Lab 01/04/18 1633 01/05/18 0552  AST 62* 43*  ALT 111* 84*  ALKPHOS 88 75  BILITOT 0.8 0.4  PROT 8.1 6.5  ALBUMIN 3.1* 2.5*   No results for input(s): LIPASE, AMYLASE in the last 168 hours. No results for input(s): AMMONIA in the last 168 hours. Coagulation Profile: No results for input(s): INR, PROTIME in the last 168 hours. Cardiac Enzymes: No results for input(s): CKTOTAL, CKMB, CKMBINDEX, TROPONINI in the last 168 hours. BNP (last 3 results) No results for  input(s): PROBNP in the last 8760 hours. HbA1C: No results for input(s): HGBA1C in the last 72 hours. CBG: No results for input(s): GLUCAP in the last 168 hours. Lipid Profile: No results for input(s): CHOL, HDL, LDLCALC, TRIG, CHOLHDL, LDLDIRECT in the last 72 hours. Thyroid Function Tests: No results for input(s): TSH, T4TOTAL, FREET4, T3FREE, THYROIDAB in the last 72 hours. Anemia Panel: No results for input(s): VITAMINB12, FOLATE, FERRITIN, TIBC, IRON, RETICCTPCT in the last 72 hours. Sepsis Labs: Recent Labs  Lab 01/04/18 1630  LATICACIDVEN 1.13    Recent Results (from the past 240 hour(s))  Blood Culture (routine x 2)     Status: None (Preliminary result)   Collection Time: 01/04/18  4:33 PM  Result Value Ref Range Status   Specimen Description LEFT ANTECUBITAL BOTTLES DRAWN AEROBIC ONLY  Final   Special Requests Blood Culture adequate volume  Final   Culture   Final    NO GROWTH 3 DAYS Performed at [facility], [address], [place], [place] [address]    Report Status PENDING  Incomplete  Blood Culture (routine x 2)     Status: None (Preliminary result)   Collection Time: 01/04/18  4:34 PM  Result Value Ref Range Status   Specimen Description BLOOD LEFT ARM BOTTLES DRAWN AEROBIC ONLY  Final   Special Requests Blood Culture adequate volume  Final   Culture   Final    NO GROWTH 3 DAYS Performed at [facility], [address] [place], [place] [address]    Report Status PENDING  Incomplete         Radiology Studies: No results found.      Scheduled Meds: . benzonatate  200 mg Oral TID  . enoxaparin (LOVENOX) injection  40 mg Subcutaneous Q24H  . guaiFENesin  1,200 mg Oral BID  . ipratropium-albuterol  3 mL Nebulization Q6H  . mouth rinse  15 mL Mouth Rinse BID  . oseltamivir  75 mg Oral BID  . sodium chloride flush  3 mL Intravenous Q12H   Continuous Infusions: . sodium chloride    . azithromycin Stopped (01/06/18 1840)  . cefTRIAXone (ROCEPHIN)   IV 1 g (01/07/18 1632)     LOS: 3 days    Time spent: 25 minutes    Kathie Dike, MD [facility] Pager [phone]  If 7PM-7AM, please contact night-coverage [email or web address] Password [facility] 01/07/2018, 5:34 PM

## 2018-01-08 DIAGNOSIS — J111 Influenza due to unidentified influenza virus with other respiratory manifestations: Secondary | ICD-10-CM

## 2018-01-08 DIAGNOSIS — J441 Chronic obstructive pulmonary disease with (acute) exacerbation: Secondary | ICD-10-CM

## 2018-01-08 DIAGNOSIS — J449 Chronic obstructive pulmonary disease, unspecified: Secondary | ICD-10-CM

## 2018-01-08 DIAGNOSIS — J181 Lobar pneumonia, unspecified organism: Secondary | ICD-10-CM

## 2018-01-08 MED ORDER — METHYLPREDNISOLONE SODIUM SUCC 125 MG IJ SOLR
60.0000 mg | Freq: Four times a day (QID) | INTRAMUSCULAR | Status: DC
  Administered 2018-01-08 – 2018-01-09 (×4): 60 mg via INTRAVENOUS
  Filled 2018-01-08 (×4): qty 2

## 2018-01-08 MED ORDER — AZITHROMYCIN 250 MG PO TABS
500.0000 mg | ORAL_TABLET | ORAL | Status: DC
  Administered 2018-01-08: 500 mg via ORAL
  Filled 2018-01-08: qty 2

## 2018-01-08 MED ORDER — BUDESONIDE 0.5 MG/2ML IN SUSP
0.5000 mg | Freq: Two times a day (BID) | RESPIRATORY_TRACT | Status: DC
  Administered 2018-01-08 – 2018-01-09 (×3): 0.5 mg via RESPIRATORY_TRACT
  Filled 2018-01-08 (×4): qty 2

## 2018-01-08 NOTE — Progress Notes (Signed)
PHARMACIST - PHYSICIAN COMMUNICATION DR:   TRH CONCERNING: Antibiotic IV to Oral Route Change Policy  RECOMMENDATION: This patient is receiving Zithromax by the intravenous route.  Based on criteria approved by the Pharmacy and Therapeutics Committee, the antibiotic(s) is/are being converted to the equivalent oral dose form(s).   DESCRIPTION: These criteria include:  Patient being treated for a respiratory tract infection, urinary tract infection, cellulitis or clostridium difficile associated diarrhea if on metronidazole  The patient is not neutropenic and does not exhibit a GI malabsorption state  The patient is eating (either orally or via tube) and/or has been taking other orally administered medications for a least 24 hours  The patient is improving clinically and has a Tmax < 100.5  If you have questions about this conversion, please contact the Pharmacy Department  [x]   [phone] )  [facility] []   [phone] )  [facility] []   [phone] )  [facility] []   [phone] )  [facility] []   [phone] )  [place], [facility]

## 2018-01-08 NOTE — Progress Notes (Signed)
PROGRESS NOTE  Emily Fitzgerald EXB:[id number] DOB: August 03, 1961 DOA: 01/04/2018 PCP: Patient, No Pcp Per  Brief History:  57 year old female with no significant past medical history admitted to the hospital with progressive shortness of breath and cough.  Found to have evidence of pneumonia on chest x-ray and also tested positive for influenza B.  On antibiotics and Tamiflu.  She is slowly improving, but is still hypoxic on ambulation.    Assessment/Plan: Acute respiratory failure with hypoxia -Secondary to pneumonia and COPD exacerbation -Patient desaturates on room air with ambulation -Patient will likely need to go home with oxygen  Lobar pneumonia -Continue ceftriaxone -Finished 5 days of azithromycin -Urine Streptococcus pneumoniae antigen negative  Influenza with respiratory manifestations -Continue oseltamivir  COPD exacerbation -Start intravenous steroids -Start Pulmicort -Patient has 25-pack-year history of tobacco, quit 1 month ago -Start Hycodan for cough -Continue duo nebs  Transaminasemia -likely due to infectious process -RUQ ultrasound -viral hepatitis serology       Disposition Plan:   Home in 1-2 days  Family Communication:   No Family at bedside  Consultants:  none  Code Status:  FULL  DVT Prophylaxis:  [place] Heparin / [place] Lovenox   Procedures: As Listed in Progress Note Above  Antibiotics: Azithromycin 2/9>>>2/13 Ceftriaxone 2/9>>>    Subjective: Overall, patient is feeling better.  She still has dyspnea with mild exertion.  She has a nonproductive cough.  She denies any hemoptysis.  Denies any fevers, chills, chest pain, nausea, vomiting, diarrhea, abdominal pain.  There is no dysuria or hematuria.  Objective: Vitals:   01/08/18 0538 01/08/18 0723 01/08/18 1346 01/08/18 1434  BP: (!) 163/87   (!) 154/84  Pulse: 75   95  Resp: 18   18  Temp: 98.2 F (36.8 C)   98.3 F (36.8 C)  TempSrc: Oral   Oral  SpO2: 93% 94% 95% 92%    Weight:      Height:        Intake/Output Summary (Last 24 hours) at 01/08/2018 1823 Last data filed at 01/08/2018 1200 Gross per 24 hour  Intake 480 ml  Output -  Net 480 ml   Weight change:  Exam:   General:  Pt is alert, follows commands appropriately, not in acute distress  HEENT: No icterus, No thrush, No neck mass, /AT  Cardiovascular: RRR, S1/S2, no rubs, no gallops  Respiratory: CTA bilaterally, no wheezing, no crackles, no rhonchi  Abdomen: Soft/+BS, non tender, non distended, no guarding  Extremities: No edema, No lymphangitis, No petechiae, No rashes, no synovitis   Data Reviewed: I have personally reviewed following labs and imaging studies Basic Metabolic Panel: Recent Labs  Lab 01/04/18 1633 01/05/18 0552  NA 143 141  K 4.0 3.5  CL 99* 102  CO2 29 28  GLUCOSE 117* 92  BUN 25* 13  CREATININE 0.70 0.50  CALCIUM 9.5 8.4*   Liver Function Tests: Recent Labs  Lab 01/04/18 1633 01/05/18 0552  AST 62* 43*  ALT 111* 84*  ALKPHOS 88 75  BILITOT 0.8 0.4  PROT 8.1 6.5  ALBUMIN 3.1* 2.5*   No results for input(s): LIPASE, AMYLASE in the last 168 hours. No results for input(s): AMMONIA in the last 168 hours. Coagulation Profile: No results for input(s): INR, PROTIME in the last 168 hours. CBC: Recent Labs  Lab 01/04/18 1633 01/05/18 0552 01/06/18 0508  WBC 17.1* 13.4* 9.2  NEUTROABS 12.1* 8.2*  --   HGB 15.1*  13.0 12.9  HCT 46.6* 42.3 41.1  MCV 98.9 99.3 98.6  PLT 267 266 262   Cardiac Enzymes: No results for input(s): CKTOTAL, CKMB, CKMBINDEX, TROPONINI in the last 168 hours. BNP: Invalid input(s): POCBNP CBG: No results for input(s): GLUCAP in the last 168 hours. HbA1C: No results for input(s): HGBA1C in the last 72 hours. Urine analysis:    Component Value Date/Time   COLORURINE YELLOW 01/04/2018 1949   APPEARANCEUR HAZY (A) 01/04/2018 1949   LABSPEC 1.023 01/04/2018 1949   PHURINE 6.0 01/04/2018 1949   GLUCOSEU NEGATIVE  01/04/2018 1949   HGBUR NEGATIVE 01/04/2018 1949   [place] NEGATIVE 01/04/2018 1949   KETONESUR 20 (A) 01/04/2018 1949   PROTEINUR 30 (A) 01/04/2018 1949   NITRITE NEGATIVE 01/04/2018 1949   LEUKOCYTESUR NEGATIVE 01/04/2018 1949   Sepsis Labs: @LABRCNTIP (procalcitonin:4,lacticidven:4) ) Recent Results (from the past 240 hour(s))  Blood Culture (routine x 2)     Status: None (Preliminary result)   Collection Time: 01/04/18  4:33 PM  Result Value Ref Range Status   Specimen Description LEFT ANTECUBITAL BOTTLES DRAWN AEROBIC ONLY  Final   Special Requests Blood Culture adequate volume  Final   Culture   Final    NO GROWTH 4 DAYS Performed at [facility], [address] [place], [place] [address]    Report Status PENDING  Incomplete  Blood Culture (routine x 2)     Status: None (Preliminary result)   Collection Time: 01/04/18  4:34 PM  Result Value Ref Range Status   Specimen Description BLOOD LEFT ARM BOTTLES DRAWN AEROBIC ONLY  Final   Special Requests Blood Culture adequate volume  Final   Culture   Final    NO GROWTH 4 DAYS Performed at [facility], [address] [place], [place] [address]    Report Status PENDING  Incomplete     Scheduled Meds: . azithromycin  500 mg Oral Q24H  . benzonatate  200 mg Oral TID  . enoxaparin (LOVENOX) injection  40 mg Subcutaneous Q24H  . guaiFENesin  1,200 mg Oral BID  . ipratropium-albuterol  3 mL Nebulization Q6H  . mouth rinse  15 mL Mouth Rinse BID  . methylPREDNISolone (SOLU-MEDROL) injection  60 mg Intravenous Q6H  . oseltamivir  75 mg Oral BID  . sodium chloride flush  3 mL Intravenous Q12H   Continuous Infusions: . sodium chloride    . cefTRIAXone (ROCEPHIN)  IV 1 g (01/08/18 1814)    Procedures/Studies: Dg Chest 2 View  Result Date: 01/04/2018 CLINICAL DATA:  Cough and fever for several days. EXAM: CHEST  2 VIEW COMPARISON:  None. FINDINGS: The cardiomediastinal silhouette is unremarkable. Mild patchy left lower lung  opacity is suspicious for pneumonia. There is no evidence of pulmonary edema, suspicious pulmonary nodule/mass, pleural effusion, or pneumothorax. No acute bony abnormalities are identified. IMPRESSION: Mild patchy opacities within the left lower lung suspicious for pneumonia. Electronically Signed   By: Margarette Canada M.D.   On: 01/04/2018 14:49    Orson Eva, DO  [facility] Pager [phone]  If 7PM-7AM, please contact night-coverage [email or web address] Password [facility] 01/08/2018, 6:23 PM   LOS: 4 days

## 2018-01-09 ENCOUNTER — Inpatient Hospital Stay (HOSPITAL_COMMUNITY): Payer: Self-pay

## 2018-01-09 DIAGNOSIS — R7401 Elevation of levels of liver transaminase levels: Secondary | ICD-10-CM

## 2018-01-09 DIAGNOSIS — R740 Nonspecific elevation of levels of transaminase and lactic acid dehydrogenase [LDH]: Secondary | ICD-10-CM

## 2018-01-09 LAB — CULTURE, BLOOD (ROUTINE X 2)
Culture: NO GROWTH
Culture: NO GROWTH
Special Requests: ADEQUATE
Special Requests: ADEQUATE

## 2018-01-09 LAB — COMPREHENSIVE METABOLIC PANEL
ALT: 57 U/L — ABNORMAL HIGH (ref 14–54)
AST: 37 U/L (ref 15–41)
Albumin: 2.9 g/dL — ABNORMAL LOW (ref 3.5–5.0)
Alkaline Phosphatase: 66 U/L (ref 38–126)
Anion gap: 11 (ref 5–15)
BUN: 11 mg/dL (ref 6–20)
CO2: 25 mmol/L (ref 22–32)
Calcium: 9.1 mg/dL (ref 8.9–10.3)
Chloride: 102 mmol/L (ref 101–111)
Creatinine, Ser: 0.56 mg/dL (ref 0.44–1.00)
GFR calc Af Amer: >60 mL/min (ref >60)
GFR calc non Af Amer: >60 mL/min (ref >60)
Glucose, Bld: 224 mg/dL — ABNORMAL HIGH (ref 65–99)
Potassium: 3.9 mmol/L (ref 3.5–5.1)
Sodium: 138 mmol/L (ref 135–145)
Total Bilirubin: 0.2 mg/dL — ABNORMAL LOW (ref 0.3–1.2)
Total Protein: 7 g/dL (ref 6.5–8.1)

## 2018-01-09 LAB — CBC
HCT: 41.5 % (ref 36.0–46.0)
Hemoglobin: 13.4 g/dL (ref 12.0–15.0)
MCH: 31 pg (ref 26.0–34.0)
MCHC: 32.3 g/dL (ref 30.0–36.0)
MCV: 96.1 fL (ref 78.0–100.0)
Platelets: 342 10*3/uL (ref 150–400)
RBC: 4.32 MIL/uL (ref 3.87–5.11)
RDW: 12.5 % (ref 11.5–15.5)
WBC: 13.6 10*3/uL — ABNORMAL HIGH (ref 4.0–10.5)

## 2018-01-09 MED ORDER — METHYLPREDNISOLONE SODIUM SUCC 125 MG IJ SOLR
60.0000 mg | Freq: Four times a day (QID) | INTRAMUSCULAR | Status: AC
  Administered 2018-01-09: 60 mg via INTRAVENOUS
  Filled 2018-01-09: qty 2

## 2018-01-09 MED ORDER — PREDNISONE 20 MG PO TABS
60.0000 mg | ORAL_TABLET | Freq: Every day | ORAL | Status: DC
  Administered 2018-01-10: 60 mg via ORAL
  Filled 2018-01-09: qty 3

## 2018-01-09 NOTE — Care Management Note (Signed)
Case Management Note  Patient Details  Name: Mistey Hoffert MRN: [id number] Date of Birth: [birth year]  Expected Discharge Date:     01/10/2018             Expected Discharge Plan:  Home/Self Care  In-House Referral:  NA  Discharge planning Services  CM Consult  Post Acute Care Choice:  Durable Medical Equipment Choice offered to:  Patient  DME Arranged:  Oxygen, Nebulizer machine DME Agency:  [facility].  Status of Service:  Completed, signed off  If discussed at [place] of Stay Meetings, dates discussed:  01/09/2018  Additional Comments: Pt will need home oxygen and neb machine at DC. Pt made appointment with [facility] for next week. Blake Divine, [facility] rep, aware of referral and will pull pt info from chart and deliver DME to pt room prior to DC. Anticipate DC in next 24-48 hrs.  Sherald Barge, RN 01/09/2018, 1:51 PM

## 2018-01-09 NOTE — Care Management (Signed)
Patient Information   Patient Name Emily Fitzgerald, Emily Fitzgerald ([id number]) Sex Female DOB [birth year]  Room Bed  A318 A318-01  Patient Demographics   Address [place] [address] Phone [phone] (Home)  Patient Ethnicity & Race   Ethnic Group Patient Race  Not Hispanic or Latino White or Caucasian  Emergency Contact(s)   Name Relation Home Work Mobile  Ford Cliff, Janett Billow Daughter   [phone]  Documents on File    Status Date Received Description  Documents for the Patient  [place] OF PRIVACY - Scanned Not Received    [place] E-Signature HIPAA Notice of Privacy Signed 85/88/50   Driver's License Not Received    Insurance Card Not Received    Advance Directives/Living Will/HCPOA/POA Not Received    Other Photo ID Not Received    Patient Photo   Photo of Patient  Documents for the Encounter  AOB (Assignment of Insurance Benefits) Not Received    E-signature AOB Signed 01/04/18   MEDICARE RIGHTS Not Received    E-signature Medicare Rights     ED Patient Billing Extract   ED PB Billing Extract  Ultrasound Received 01/09/18   Admission Information   Attending Provider Admitting Provider Admission Type Admission Date/Time  Tat, Shanon Brow, MD Phillips Grout, MD Emergency 01/04/18 1504  Discharge Date Hospital Service Auth/Cert Status Service Area   Internal Medicine Incomplete [place]  Unit Room/Bed Admission Status   AP-DEPT 300 A318/A318-01 Admission (Confirmed)   Admission   Complaint  C[facility], [facility] Account   Name Acct ID Class Status Primary Coverage  Emily Fitzgerald, Emily Fitzgerald [id number] Inpatient Open MEDICAID POTENTIAL - MEDICAID POTENTIAL      Guarantor Account (for [place] [id number])   Name Relation to [place]? Acct Type  Emily Fitzgerald Self [facility] Yes Personal/Family  Address Phone    [address] [place], [place] [address] [phone])        Coverage Information (for Hospital Account  [id number])   F/O Payor/Plan Precert #  MEDICAID POTENTIAL/MEDICAID POTENTIAL   Subscriber Subscriber #  Emily Fitzgerald, Emily Fitzgerald [id number]  Address Phone  [address] [place], [place] [address] [phone]

## 2018-01-09 NOTE — Progress Notes (Signed)
PROGRESS NOTE  Emily Fitzgerald KXF:[id number] DOB: 10-17-1961 DOA: 01/04/2018 PCP: Patient, No Pcp Per  Brief History:  57 year old female with no significant past medical history admitted to the hospital with progressive shortness of breath and cough. Found to have evidence of pneumonia on chest x-ray and also tested positive for influenza B. On antibiotics and Tamiflu. She is slowly improving, but is still hypoxic on ambulation.   The patient finished 5 days of ceftriaxone and azithromycin.  There was difficulty weaning the patient off oxygen.  She was felt to ultimately have a COPD exacerbation.  The patient was started on intravenous steroids with clinical improvement.  However, amatory pulse ox did reveal desaturation less than 88%; therefore, the patient was set up with home oxygen.   Assessment/Plan: Acute respiratory failure with hypoxia -Secondary to pneumonia and COPD exacerbation -Patient desaturates on room air with ambulation -ambulatory pulse ox showed desaturation <88%-->set up home oxygen  Lobar pneumonia -Finished 5 days of azithromycin/azithromycin -Urine Streptococcus pneumoniae antigen negative  Influenza with respiratory manifestations -Continue oseltamivir--finish 5 days 01/09/18  COPD exacerbation -Started intravenous steroids>>>po steroids -Continue Pulmicort -Patient has 25-pack-year history of tobacco, quit 1 month ago -Start Hycodan for cough -Continue duo nebs  Transaminasemia -likely due to acute infectious process-->trending down -no abdominal pain -RUQ ultrasound--cholelithiasis without cholecystitis.  Mild gallbladder wall thickening.  No ductal dilatation. -viral hepatitis serology--neg  Disposition Plan:   Home 2/15 if stable Family Communication:   No Family at bedside  Consultants:  none  Code Status:  FULL  DVT Prophylaxis:  [place] Lovenox   Procedures: As Listed in Progress Note Above  Antibiotics: Azithromycin  2/9>>>2/13 Ceftriaxone 2/9>>>2/13     Subjective: Patient is breathing better but still has some dyspnea on mild exertion.  She still has a nonproductive cough but it is improving.  She denies any fevers, chills, chest pain, nausea, vomiting, diarrhea, abdominal pain.  There is no dysuria hematuria.  No headache or neck pain.  Objective: Vitals:   01/08/18 2239 01/09/18 0620 01/09/18 0722 01/09/18 0727  BP: (!) 160/77 (!) 161/83    Pulse: 85 80    Resp: 20 20    Temp: 98 F (36.7 C) (!) 97.5 F (36.4 C)    TempSrc:      SpO2: 92% 90% 90% 90%  Weight:      Height:        Intake/Output Summary (Last 24 hours) at 01/09/2018 1425 Last data filed at 01/09/2018 0900 Gross per 24 hour  Intake 240 ml  Output -  Net 240 ml   Weight change:  Exam:   General:  Pt is alert, follows commands appropriately, not in acute distress  HEENT: No icterus, No thrush, No neck mass, [place]/AT  Cardiovascular: RRR, S1/S2, no rubs, no gallops  Respiratory: Bilateral scattered rales.  Minimal basilar wheezing.  Good air movement.  Abdomen: Soft/+BS, non tender, non distended, no guarding  Extremities: No edema, No lymphangitis, No petechiae, No rashes, no synovitis   Data Reviewed: I have personally reviewed following labs and imaging studies Basic Metabolic Panel: Recent Labs  Lab 01/04/18 1633 01/05/18 0552 01/09/18 0544  NA 143 141 138  K 4.0 3.5 3.9  CL 99* 102 102  CO2 29 28 25   GLUCOSE 117* 92 224*  BUN 25* 13 11  CREATININE 0.70 0.50 0.56  CALCIUM 9.5 8.4* 9.1   Liver Function Tests: Recent Labs  Lab 01/04/18 1633 01/05/18 0552 01/09/18  0544  AST 62* 43* 37  ALT 111* 84* 57*  ALKPHOS 88 75 66  BILITOT 0.8 0.4 0.2*  PROT 8.1 6.5 7.0  ALBUMIN 3.1* 2.5* 2.9*   No results for input(s): LIPASE, AMYLASE in the last 168 hours. No results for input(s): AMMONIA in the last 168 hours. Coagulation Profile: No results for input(s): INR, PROTIME in the last 168  hours. CBC: Recent Labs  Lab 01/04/18 1633 01/05/18 0552 01/06/18 0508 01/09/18 0544  WBC 17.1* 13.4* 9.2 13.6*  NEUTROABS 12.1* 8.2*  --   --   HGB 15.1* 13.0 12.9 13.4  HCT 46.6* 42.3 41.1 41.5  MCV 98.9 99.3 98.6 96.1  PLT 267 266 262 342   Cardiac Enzymes: No results for input(s): CKTOTAL, CKMB, CKMBINDEX, TROPONINI in the last 168 hours. BNP: Invalid input(s): POCBNP CBG: No results for input(s): GLUCAP in the last 168 hours. HbA1C: No results for input(s): HGBA1C in the last 72 hours. Urine analysis:    Component Value Date/Time   COLORURINE YELLOW 01/04/2018 1949   APPEARANCEUR HAZY (A) 01/04/2018 1949   LABSPEC 1.023 01/04/2018 1949   PHURINE 6.0 01/04/2018 1949   GLUCOSEU NEGATIVE 01/04/2018 1949   HGBUR NEGATIVE 01/04/2018 1949   [place] NEGATIVE 01/04/2018 1949   KETONESUR 20 (A) 01/04/2018 1949   PROTEINUR 30 (A) 01/04/2018 1949   NITRITE NEGATIVE 01/04/2018 1949   LEUKOCYTESUR NEGATIVE 01/04/2018 1949   Sepsis Labs: @LABRCNTIP (procalcitonin:4,lacticidven:4) ) Recent Results (from the past 240 hour(s))  Blood Culture (routine x 2)     Status: None   Collection Time: 01/04/18  4:33 PM  Result Value Ref Range Status   Specimen Description LEFT ANTECUBITAL BOTTLES DRAWN AEROBIC ONLY  Final   Special Requests Blood Culture adequate volume  Final   Culture   Final    NO GROWTH 5 DAYS Performed at [facility], [address], [place], [place] [address]    Report Status 01/09/2018 FINAL  Final  Blood Culture (routine x 2)     Status: None   Collection Time: 01/04/18  4:34 PM  Result Value Ref Range Status   Specimen Description BLOOD LEFT ARM BOTTLES DRAWN AEROBIC ONLY  Final   Special Requests Blood Culture adequate volume  Final   Culture   Final    NO GROWTH 5 DAYS Performed at [facility], [address] [place], [place] [address]    Report Status 01/09/2018 FINAL  Final     Scheduled Meds: . azithromycin  500 mg Oral Q24H  . benzonatate   200 mg Oral TID  . budesonide (PULMICORT) nebulizer solution  0.5 mg Nebulization BID  . enoxaparin (LOVENOX) injection  40 mg Subcutaneous Q24H  . guaiFENesin  1,200 mg Oral BID  . ipratropium-albuterol  3 mL Nebulization Q6H  . mouth rinse  15 mL Mouth Rinse BID  . methylPREDNISolone (SOLU-MEDROL) injection  60 mg Intravenous Q6H  . oseltamivir  75 mg Oral BID  . sodium chloride flush  3 mL Intravenous Q12H   Continuous Infusions: . sodium chloride    . cefTRIAXone (ROCEPHIN)  IV 1 g (01/08/18 1814)    Procedures/Studies: Dg Chest 2 View  Result Date: 01/04/2018 CLINICAL DATA:  Cough and fever for several days. EXAM: CHEST  2 VIEW COMPARISON:  None. FINDINGS: The cardiomediastinal silhouette is unremarkable. Mild patchy left lower lung opacity is suspicious for pneumonia. There is no evidence of pulmonary edema, suspicious pulmonary nodule/mass, pleural effusion, or pneumothorax. No acute bony abnormalities are identified. IMPRESSION: Mild  patchy opacities within the left lower lung suspicious for pneumonia. Electronically Signed   By: Margarette Canada M.D.   On: 01/04/2018 14:49   US Abdomen Limited Ruq  Result Date: 01/09/2018 CLINICAL DATA:  Elevated liver function studies.  Flu-like symptoms. EXAM: ULTRASOUND ABDOMEN LIMITED RIGHT UPPER QUADRANT COMPARISON:  None in PACs FINDINGS: Gallbladder: The gallbladder is contracted. There are multiple gallstones present with the largest measuring 1.9 cm in greatest dimension. There is mild gallbladder wall thickening to 4.5 mm. There is no pericholecystic fluid or positive sonographic Murphy's sign. Common bile duct: Diameter: 3.6 mm Liver: No focal lesion identified. Within normal limits in parenchymal echogenicity. Portal vein is patent on color Doppler imaging with normal direction of blood flow towards the liver. IMPRESSION: Gallstones without sonographic evidence of acute cholecystitis. Mild gallbladder wall thickening as well as a contracted  gallbladder in the presence of stones may reflect chronic cholecystitis. No abnormality of the common bile duct or liver. Electronically Signed   By: Eimy Plaza  [place] M.D.   On: 01/09/2018 13:12    Orson Eva, DO  [facility] Pager [phone]  If 7PM-7AM, please contact night-coverage [email or web address] Password [email or web address] 01/09/2018, 2:25 PM   LOS: 5 days

## 2018-01-09 NOTE — Progress Notes (Signed)
Inpatient Diabetes Program Recommendations  AACE/ADA: New Consensus Statement on Inpatient Glycemic Control (2015)  Target Ranges:  Prepandial:   less than 140 mg/dL      Peak postprandial:   less than 180 mg/dL (1-2 hours)      Critically ill patients:  140 - 180 mg/dL   Results for Emily Fitzgerald, Emily Fitzgerald (MRN [id number]) as of 01/09/2018 10:55  Ref. Range 01/04/2018 16:33 01/05/2018 05:52 01/09/2018 05:44  Glucose Latest Ref Range: 65 - 99 mg/dL 117 (H) 92 224 (H)   Review of Glycemic Control  Diabetes history: No Outpatient Diabetes medications: NA Current orders for Inpatient glycemic control: None  Inpatient Diabetes Program Recommendations: Correction (SSI): While inpatient and ordered steroids, please consider ordering CBGs with Novolog correction scale ACHS.  Thanks, Barnie Alderman, RN, MSN, CDE Diabetes Coordinator Inpatient Diabetes Program [phone] (Team Pager from 8am to 5pm)

## 2018-01-09 NOTE — Progress Notes (Signed)
SATURATION QUALIFICATIONS: (This note is used to comply with regulatory documentation for home oxygen)  Patient Saturations on Room Air at Rest =88%  Patient Saturations on Room Air while Ambulating = 86%  Patient Saturations on 3 Liters of oxygen while at rest = 91%  Please briefly explain why patient needs home oxygen: Pt desats when on room air

## 2018-01-10 DIAGNOSIS — J111 Influenza due to unidentified influenza virus with other respiratory manifestations: Secondary | ICD-10-CM

## 2018-01-10 LAB — HEPATITIS C ANTIBODY: HCV Ab: <0.1 s/co ratio (ref 0.0–0.9)

## 2018-01-10 LAB — HEPATITIS B SURFACE ANTIGEN: Hepatitis B Surface Ag: NEGATIVE

## 2018-01-10 MED ORDER — PREDNISONE 10 MG PO TABS: 60.0000 mg | ORAL_TABLET | Freq: Every day | ORAL | 0 refills | Status: DC

## 2018-01-10 MED ORDER — IPRATROPIUM-ALBUTEROL 0.5-2.5 (3) MG/3ML IN SOLN: 3.0000 mL | Freq: Four times a day (QID) | RESPIRATORY_TRACT | 0 refills | Status: DC | PRN

## 2018-01-10 NOTE — Progress Notes (Signed)
Patient is to be discharged home and in stable condition. Patient's nebulizer and oxygen delivered to room. Patient's IV removed, WNL. All questions addressed and answered. Patient given discharge instructions and verbalized understanding. Patient will be escorted out by staff via wheelchair.  Celestia Khat, RN

## 2018-01-10 NOTE — Discharge Summary (Signed)
Physician Discharge Summary  Emily Fitzgerald PNT:[id number] DOB: [birth year] DOA: 01/04/2018  PCP: Patient, No Pcp Per  Admit date: 01/04/2018 Discharge date: 01/10/2018  Admitted From: Home Disposition:  Home   Recommendations for Outpatient Follow-up:  1. Follow up with PCP in 1-2 weeks 2. Please obtain BMP/CBC in one week   Home Health: YES Equipment/Devices: 3 L oxygen  Discharge Condition: Stable CODE STATUS: FULL Diet recommendation: Heart Healthy    Brief/Interim Summary: 57 year old female with no significant past medical history admitted to the hospital with progressive shortness of breath and cough. Found to have evidence of pneumonia on chest x-ray and also tested positive for influenza B. On antibiotics and Tamiflu. She is slowly improving, but is still hypoxic on ambulation.  The patient finished 5 days of ceftriaxone and azithromycin.  There was difficulty weaning the patient off oxygen.  She was felt to ultimately have a COPD exacerbation.  The patient was started on intravenous steroids with clinical improvement.  However, amatory pulse ox did reveal desaturation less than 88%; therefore, the patient was set up with home oxygen--3L.    Discharge Diagnoses:  Acute respiratory failure with hypoxia -Secondary to pneumonia and COPD exacerbation -Patient desaturates on room air with ambulation -ambulatory pulse ox showed desaturation <88%-->set up home oxygen at 3L  Lobar pneumonia -Finished 5 days of azithromycin/azithromycin -Urine Streptococcus pneumoniae antigen negative  Influenza with respiratory manifestations -Continue oseltamivir--finish 5 days 01/09/18  COPD exacerbation -Started intravenous steroids>>>po steroids, home with taper -Continue Pulmicort -Patient has 25-pack-year history of tobacco, quit 1 month ago -Started Hycodan for cough -Continue duo nebs  Transaminasemia -likely due to acute infectious process-->trending down -no abdominal  pain -RUQ ultrasound--cholelithiasis without cholecystitis.  Mild gallbladder wall thickening.  No ductal dilatation. -viral hepatitis serology--neg     Discharge Instructions  Discharge Instructions    Diet - low sodium heart healthy   Complete by:  As directed    Increase activity slowly   Complete by:  As directed      Allergies as of 01/10/2018   No Known Allergies     Medication List    TAKE these medications   acetaminophen 325 MG tablet Commonly known as:  TYLENOL Take 650 mg by mouth every 6 (six) hours as needed.   ibuprofen 200 MG tablet Commonly known as:  ADVIL,MOTRIN Take 200 mg by mouth every 6 (six) hours as needed.   ipratropium-albuterol 0.5-2.5 (3) MG/3ML Soln Commonly known as:  DUONEB Take 3 mLs by nebulization every 6 (six) hours as needed.   predniSONE 10 MG tablet Commonly known as:  DELTASONE Take 6 tablets (60 mg total) by mouth daily with breakfast. And decrease by one tablet daily Start taking on:  01/11/2018            Durable Medical Equipment  (From admission, onward)        Start     Ordered   01/10/18 1148  For home use only DME Nebulizer machine  Once    Question:  Patient needs a nebulizer to treat with the following condition  Answer:  COPD exacerbation ([place])   01/10/18 [place] Follow up on 01/15/2018.   Why:  MAP appoinmtnet @ 2pm Doctor appointment @ 3pm Contact information: [address], [place], [place] [address] [phone]         No Known Allergies  Consultations:  none   Procedures/Studies: Dg Chest  2 View  Result Date: 01/04/2018 CLINICAL DATA:  Cough and fever for several days. EXAM: CHEST  2 VIEW COMPARISON:  None. FINDINGS: The cardiomediastinal silhouette is unremarkable. Mild patchy left lower lung opacity is suspicious for pneumonia. There is no evidence of pulmonary edema, suspicious pulmonary nodule/mass, pleural effusion, or  pneumothorax. No acute bony abnormalities are identified. IMPRESSION: Mild patchy opacities within the left lower lung suspicious for pneumonia. Electronically Signed   By: Margarette Canada M.D.   On: 01/04/2018 14:49   US Abdomen Limited Ruq  Result Date: 01/09/2018 CLINICAL DATA:  Elevated liver function studies.  Flu-like symptoms. EXAM: ULTRASOUND ABDOMEN LIMITED RIGHT UPPER QUADRANT COMPARISON:  None in PACs FINDINGS: Gallbladder: The gallbladder is contracted. There are multiple gallstones present with the largest measuring 1.9 cm in greatest dimension. There is mild gallbladder wall thickening to 4.5 mm. There is no pericholecystic fluid or positive sonographic Murphy's sign. Common bile duct: Diameter: 3.6 mm Liver: No focal lesion identified. Within normal limits in parenchymal echogenicity. Portal vein is patent on color Doppler imaging with normal direction of blood flow towards the liver. IMPRESSION: Gallstones without sonographic evidence of acute cholecystitis. Mild gallbladder wall thickening as well as a contracted gallbladder in the presence of stones may reflect chronic cholecystitis. No abnormality of the common bile duct or liver. Electronically Signed   By: Euan Wandler  [place] M.D.   On: 01/09/2018 13:12         Discharge Exam: Vitals:   01/10/18 0545 01/10/18 1133  BP: (!) 158/76   Pulse: 66   Resp: 20   Temp: 98.1 F (36.7 C)   SpO2: 93% (!) 88%   Vitals:   01/09/18 2034 01/09/18 2245 01/10/18 0545 01/10/18 1133  BP:  (!) 144/77 (!) 158/76   Pulse:  70 66   Resp:  16 20   Temp:  97.8 F (36.6 C) 98.1 F (36.7 C)   TempSrc:      SpO2: 91% 90% 93% (!) 88%  Weight:      Height:        General: Pt is alert, awake, not in acute distress Cardiovascular: RRR, S1/S2 +, no rubs, no gallops Respiratory: CTA bilaterally, no wheezing, no rhonchi Abdominal: Soft, NT, ND, bowel sounds + Extremities: no edema, no cyanosis   The results of significant diagnostics from this  hospitalization (including imaging, microbiology, ancillary and laboratory) are listed below for reference.    Significant Diagnostic Studies: Dg Chest 2 View  Result Date: 01/04/2018 CLINICAL DATA:  Cough and fever for several days. EXAM: CHEST  2 VIEW COMPARISON:  None. FINDINGS: The cardiomediastinal silhouette is unremarkable. Mild patchy left lower lung opacity is suspicious for pneumonia. There is no evidence of pulmonary edema, suspicious pulmonary nodule/mass, pleural effusion, or pneumothorax. No acute bony abnormalities are identified. IMPRESSION: Mild patchy opacities within the left lower lung suspicious for pneumonia. Electronically Signed   By: Margarette Canada M.D.   On: 01/04/2018 14:49   US Abdomen Limited Ruq  Result Date: 01/09/2018 CLINICAL DATA:  Elevated liver function studies.  Flu-like symptoms. EXAM: ULTRASOUND ABDOMEN LIMITED RIGHT UPPER QUADRANT COMPARISON:  None in PACs FINDINGS: Gallbladder: The gallbladder is contracted. There are multiple gallstones present with the largest measuring 1.9 cm in greatest dimension. There is mild gallbladder wall thickening to 4.5 mm. There is no pericholecystic fluid or positive sonographic Murphy's sign. Common bile duct: Diameter: 3.6 mm Liver: No focal lesion identified. Within normal limits in parenchymal echogenicity. Portal vein is patent on  color Doppler imaging with normal direction of blood flow towards the liver. IMPRESSION: Gallstones without sonographic evidence of acute cholecystitis. Mild gallbladder wall thickening as well as a contracted gallbladder in the presence of stones may reflect chronic cholecystitis. No abnormality of the common bile duct or liver. Electronically Signed   By: Denisa Enterline  [place] M.D.   On: 01/09/2018 13:12     Microbiology: Recent Results (from the past 240 hour(s))  Blood Culture (routine x 2)     Status: None   Collection Time: 01/04/18  4:33 PM  Result Value Ref Range Status   Specimen Description LEFT  ANTECUBITAL BOTTLES DRAWN AEROBIC ONLY  Final   Special Requests Blood Culture adequate volume  Final   Culture   Final    NO GROWTH 5 DAYS Performed at [facility], [address] [place], [place] [address]    Report Status 01/09/2018 FINAL  Final  Blood Culture (routine x 2)     Status: None   Collection Time: 01/04/18  4:34 PM  Result Value Ref Range Status   Specimen Description BLOOD LEFT ARM BOTTLES DRAWN AEROBIC ONLY  Final   Special Requests Blood Culture adequate volume  Final   Culture   Final    NO GROWTH 5 DAYS Performed at [facility], [address] [place], [place] [address]    Report Status 01/09/2018 FINAL  Final     Labs: Basic Metabolic Panel: Recent Labs  Lab 01/04/18 1633 01/05/18 0552 01/09/18 0544  NA 143 141 138  K 4.0 3.5 3.9  CL 99* 102 102  CO2 29 28 25   GLUCOSE 117* 92 224*  BUN 25* 13 11  CREATININE 0.70 0.50 0.56  CALCIUM 9.5 8.4* 9.1   Liver Function Tests: Recent Labs  Lab 01/04/18 1633 01/05/18 0552 01/09/18 0544  AST 62* 43* 37  ALT 111* 84* 57*  ALKPHOS 88 75 66  BILITOT 0.8 0.4 0.2*  PROT 8.1 6.5 7.0  ALBUMIN 3.1* 2.5* 2.9*   No results for input(s): LIPASE, AMYLASE in the last 168 hours. No results for input(s): AMMONIA in the last 168 hours. CBC: Recent Labs  Lab 01/04/18 1633 01/05/18 0552 01/06/18 0508 01/09/18 0544  WBC 17.1* 13.4* 9.2 13.6*  NEUTROABS 12.1* 8.2*  --   --   HGB 15.1* 13.0 12.9 13.4  HCT 46.6* 42.3 41.1 41.5  MCV 98.9 99.3 98.6 96.1  PLT 267 266 262 342   Cardiac Enzymes: No results for input(s): CKTOTAL, CKMB, CKMBINDEX, TROPONINI in the last 168 hours. BNP: Invalid input(s): POCBNP CBG: No results for input(s): GLUCAP in the last 168 hours.  Time coordinating discharge:  Greater than 30 minutes  Signed:  Orson Eva, DO [facility] Pager: [phone] 01/10/2018, 12:07 PM

## 2019-01-04 ENCOUNTER — Emergency Department (HOSPITAL_COMMUNITY)
Admission: EM | Admit: 2019-01-04 | Discharge: 2019-01-04 | Disposition: A | Discharge status: Home / Self Care | Payer: Medicaid Other | Attending: Emergency Medicine | Admitting: Emergency Medicine

## 2019-01-04 ENCOUNTER — Encounter (HOSPITAL_COMMUNITY): Payer: Self-pay | Admitting: Emergency Medicine

## 2019-01-04 ENCOUNTER — Other Ambulatory Visit: Payer: Self-pay

## 2019-01-04 ENCOUNTER — Emergency Department (HOSPITAL_COMMUNITY): Payer: Medicaid Other

## 2019-01-04 DIAGNOSIS — J449 Chronic obstructive pulmonary disease, unspecified: Secondary | ICD-10-CM | POA: Insufficient documentation

## 2019-01-04 DIAGNOSIS — J441 Chronic obstructive pulmonary disease with (acute) exacerbation: Secondary | ICD-10-CM | POA: Insufficient documentation

## 2019-01-04 DIAGNOSIS — Z79899 Other long term (current) drug therapy: Secondary | ICD-10-CM | POA: Insufficient documentation

## 2019-01-04 DIAGNOSIS — R05 Cough: Secondary | ICD-10-CM

## 2019-01-04 DIAGNOSIS — Z87891 Personal history of nicotine dependence: Secondary | ICD-10-CM | POA: Insufficient documentation

## 2019-01-04 DIAGNOSIS — R0602 Shortness of breath: Secondary | ICD-10-CM | POA: Diagnosis present

## 2019-01-04 DIAGNOSIS — R059 Cough, unspecified: Secondary | ICD-10-CM

## 2019-01-04 HISTORY — DX: Chronic obstructive pulmonary disease, unspecified: J44.9

## 2019-01-04 LAB — COMPREHENSIVE METABOLIC PANEL
ALT: 33 U/L (ref 0–44)
AST: 17 U/L (ref 15–41)
Albumin: 3.4 g/dL — ABNORMAL LOW (ref 3.5–5.0)
Alkaline Phosphatase: 91 U/L (ref 38–126)
Anion gap: 8 (ref 5–15)
BUN: 21 mg/dL — ABNORMAL HIGH (ref 6–20)
CO2: 26 mmol/L (ref 22–32)
Calcium: 8.6 mg/dL — ABNORMAL LOW (ref 8.9–10.3)
Chloride: 102 mmol/L (ref 98–111)
Creatinine, Ser: 0.74 mg/dL (ref 0.44–1.00)
GFR calc Af Amer: >60 mL/min (ref >60)
GFR calc non Af Amer: >60 mL/min (ref >60)
Glucose, Bld: 125 mg/dL — ABNORMAL HIGH (ref 70–99)
Potassium: 4.1 mmol/L (ref 3.5–5.1)
Sodium: 136 mmol/L (ref 135–145)
Total Bilirubin: 0.4 mg/dL (ref 0.3–1.2)
Total Protein: 7 g/dL (ref 6.5–8.1)

## 2019-01-04 LAB — CBC WITH DIFFERENTIAL/PLATELET
Abs Immature Granulocytes: 0.1 10*3/uL — ABNORMAL HIGH (ref 0.00–0.07)
Basophils Absolute: 0.1 10*3/uL (ref 0.0–0.1)
Basophils Relative: 0 %
Eosinophils Absolute: 0.3 10*3/uL (ref 0.0–0.5)
Eosinophils Relative: 2 %
HCT: 48 % — ABNORMAL HIGH (ref 36.0–46.0)
Hemoglobin: 15 g/dL (ref 12.0–15.0)
Immature Granulocytes: 1 %
Lymphocytes Relative: 38 %
Lymphs Abs: 5.6 10*3/uL — ABNORMAL HIGH (ref 0.7–4.0)
MCH: 30.2 pg (ref 26.0–34.0)
MCHC: 31.3 g/dL (ref 30.0–36.0)
MCV: 96.8 fL (ref 80.0–100.0)
Monocytes Absolute: 1 10*3/uL (ref 0.1–1.0)
Monocytes Relative: 6 %
Neutro Abs: 7.9 10*3/uL — ABNORMAL HIGH (ref 1.7–7.7)
Neutrophils Relative %: 53 %
Platelets: 318 10*3/uL (ref 150–400)
RBC: 4.96 MIL/uL (ref 3.87–5.11)
RDW: 12.6 % (ref 11.5–15.5)
WBC: 15 10*3/uL — ABNORMAL HIGH (ref 4.0–10.5)
nRBC: 0 % (ref 0.0–0.2)

## 2019-01-04 LAB — BRAIN NATRIURETIC PEPTIDE: B Natriuretic Peptide: 34 pg/mL (ref 0.0–100.0)

## 2019-01-04 MED ORDER — ALBUTEROL SULFATE (2.5 MG/3ML) 0.083% IN NEBU
5.0000 mg | INHALATION_SOLUTION | Freq: Once | RESPIRATORY_TRACT | Status: AC
  Administered 2019-01-04: 5 mg via RESPIRATORY_TRACT
  Filled 2019-01-04: qty 6

## 2019-01-04 MED ORDER — HYDROCOD POLST-CPM POLST ER 10-8 MG/5ML PO SUER: 5.0000 mL | Freq: Two times a day (BID) | ORAL | 0 refills | Status: DC | PRN

## 2019-01-04 MED ORDER — DOXYCYCLINE HYCLATE 100 MG PO TABS
100.0000 mg | ORAL_TABLET | Freq: Once | ORAL | Status: AC
  Administered 2019-01-04: 100 mg via ORAL
  Filled 2019-01-04: qty 1

## 2019-01-04 MED ORDER — DOXYCYCLINE HYCLATE 100 MG PO CAPS: 100.0000 mg | ORAL_CAPSULE | Freq: Two times a day (BID) | ORAL | 0 refills | Status: DC

## 2019-01-04 MED ORDER — IPRATROPIUM-ALBUTEROL 0.5-2.5 (3) MG/3ML IN SOLN
3.0000 mL | Freq: Once | RESPIRATORY_TRACT | Status: AC
  Administered 2019-01-04: 3 mL via RESPIRATORY_TRACT
  Filled 2019-01-04: qty 3

## 2019-01-04 MED ORDER — PREDNISONE 10 MG PO TABS: 20.0000 mg | ORAL_TABLET | Freq: Every day | ORAL | 0 refills | Status: DC

## 2019-01-04 MED ORDER — METHYLPREDNISOLONE SODIUM SUCC 125 MG IJ SOLR
125.0000 mg | Freq: Once | INTRAMUSCULAR | Status: AC
  Administered 2019-01-04: 125 mg via INTRAVENOUS
  Filled 2019-01-04: qty 2

## 2019-01-04 MED ORDER — MAGNESIUM SULFATE 2 GM/50ML IV SOLN
2.0000 g | Freq: Once | INTRAVENOUS | Status: AC
  Administered 2019-01-04: 2 g via INTRAVENOUS
  Filled 2019-01-04: qty 50

## 2019-01-04 NOTE — ED Triage Notes (Signed)
Pt is supposed to be on O2/3L  Gives self home breathing treatment of albuterol

## 2019-01-04 NOTE — ED Provider Notes (Signed)
[facility] EMERGENCY DEPARTMENT Provider Note   CSN: [id number] Arrival date & time: 01/04/19  1649     History   Chief Complaint Chief Complaint  Patient presents with  . Cough    HPI Emily Fitzgerald is a 58 y.o. female.  Patient complains of cough and shortness of breath for over a week.  Patient has history of COPD  The history is provided by the patient. No language interpreter was used.  Cough  Cough characteristics:  Dry and hacking Sputum characteristics:  Nondescript Severity:  Moderate Onset quality:  Gradual Timing:  Constant Progression:  Worsening Chronicity:  Recurrent Smoker: no   Context: not animal exposure   Relieved by:  Nothing Worsened by:  Nothing Associated symptoms: no chest pain, no eye discharge, no headaches and no rash     Past Medical History:  Diagnosis Date  . COPD (chronic obstructive pulmonary disease) [facility])     Patient Active Problem List   Diagnosis Date Noted  . Transaminasemia   . COPD with acute exacerbation ([place]) 01/08/2018  . Influenza with respiratory manifestation 01/08/2018  . Lobar pneumonia ([place]) 01/08/2018  . Influenza B 01/05/2018  . Community acquired pneumonia of left lower lobe of lung ([place]) 01/04/2018  . Acute respiratory failure with hypoxia ([place]) 01/04/2018  . Elevated LFTs 01/04/2018    Past Surgical History:  Procedure Laterality Date  . CESAREAN SECTION       OB History   No obstetric history on file.      Home Medications    Prior to Admission medications   Medication Sig Start Date End Date Taking? Authorizing Provider  albuterol (PROVENTIL HFA;VENTOLIN HFA) 108 (90 Base) MCG/ACT inhaler Inhale 1-2 puffs into the lungs every 6 (six) hours as needed for wheezing or shortness of breath.   Yes [provider]  cetirizine (ZYRTEC) 10 MG tablet Take 5 mg by mouth daily. 12/22/18  Yes [provider]  gabapentin (NEURONTIN) 100 MG capsule Take 100 mg by mouth 3 (three) times daily.  10/02/18  Yes [provider]  hydrOXYzine (ATARAX/VISTARIL) 50 MG tablet Take 50 mg by mouth at bedtime as needed. for sleep 12/22/18  Yes [provider]  acetaminophen (TYLENOL) 325 MG tablet Take 650 mg by mouth every 6 (six) hours as needed.    [provider]  chlorpheniramine-HYDROcodone (TUSSIONEX PENNKINETIC ER) 10-8 MG/5ML SUER Take 5 mLs by mouth every 12 (twelve) hours as needed for cough. 01/04/19   Milton Ferguson, MD  doxycycline (VIBRAMYCIN) 100 MG capsule Take 1 capsule (100 mg total) by mouth 2 (two) times daily. One po bid x 7 days 01/04/19   Milton Ferguson, MD  ibuprofen (ADVIL,MOTRIN) 200 MG tablet Take 200 mg by mouth every 6 (six) hours as needed.    [provider]  ipratropium-albuterol (DUONEB) 0.5-2.5 (3) MG/3ML SOLN Take 3 mLs by nebulization every 6 (six) hours as needed. 01/10/18   Orson Eva, MD  predniSONE (DELTASONE) 10 MG tablet Take 2 tablets (20 mg total) by mouth daily. 01/04/19   Milton Ferguson, MD    Family History No family history on file.  Social History Social History   Tobacco Use  . Smoking status: Former Research scientist (life sciences)  . Smokeless tobacco: Never Used  Substance Use Topics  . Alcohol use: No    Frequency: Never  . Drug use: No     Allergies   Patient has no known allergies.   Review of Systems Review of Systems  Constitutional: Negative for appetite  change and fatigue.  HENT: Negative for congestion, ear discharge and sinus pressure.   Eyes: Negative for discharge.  Respiratory: Positive for cough.   Cardiovascular: Negative for chest pain.  Gastrointestinal: Negative for abdominal pain and diarrhea.  Genitourinary: Negative for frequency and hematuria.  Musculoskeletal: Negative for back pain.  Skin: Negative for rash.  Neurological: Negative for seizures and headaches.  Psychiatric/Behavioral: Negative for hallucinations.     Physical Exam Updated Vital Signs BP (!) 142/84   Pulse (!) 116   Temp 98.5  F (36.9 C) (Oral)   Resp (!) 21   Ht 5\' 7"  (1.702 m)   Wt 113.4 kg   SpO2 95%   BMI 39.16 kg/m   Physical Exam Vitals signs and nursing note reviewed.  Constitutional:      Appearance: She is well-developed.  HENT:     Head: Normocephalic.     Nose: Nose normal.  Eyes:     General: No scleral icterus.    Conjunctiva/sclera: Conjunctivae normal.  Neck:     Musculoskeletal: Neck supple.     Thyroid: No thyromegaly.  Cardiovascular:     Rate and Rhythm: Normal rate and regular rhythm.     Heart sounds: No murmur. No friction rub. No gallop.   Pulmonary:     Breath sounds: No stridor. Wheezing present. No rales.  Chest:     Chest wall: No tenderness.  Abdominal:     General: There is no distension.     Tenderness: There is no abdominal tenderness. There is no rebound.  Musculoskeletal: Normal range of motion.  Lymphadenopathy:     Cervical: No cervical adenopathy.  Skin:    Findings: No erythema or rash.  Neurological:     Mental Status: She is oriented to person, place, and time.     Motor: No abnormal muscle tone.     Coordination: Coordination normal.  Psychiatric:        Behavior: Behavior normal.      ED Treatments / Results  Labs (all labs ordered are listed, but only abnormal results are displayed) Labs Reviewed  CBC WITH DIFFERENTIAL/PLATELET - Abnormal; Notable for the following components:      Result Value   WBC 15.0 (*)    HCT 48.0 (*)    Neutro Abs 7.9 (*)    Lymphs Abs 5.6 (*)    Abs Immature Granulocytes 0.10 (*)    All other components within normal limits  COMPREHENSIVE METABOLIC PANEL - Abnormal; Notable for the following components:   Glucose, Bld 125 (*)    BUN 21 (*)    Calcium 8.6 (*)    Albumin 3.4 (*)    All other components within normal limits  BRAIN NATRIURETIC PEPTIDE    EKG EKG Interpretation  Date/Time:  Sunday January 04 2019 17:17:05 EST Ventricular Rate:  111 PR Interval:    QRS Duration: 79 QT  Interval:  318 QTC Calculation: 433 R Axis:   49 Text Interpretation:  Sinus tachycardia Low voltage, precordial leads Abnormal R-wave progression, late transition Confirmed by Milton Ferguson [phone]) on 01/04/2019 6:51:56 PM   Radiology Dg Chest 2 View  Result Date: 01/04/2019 CLINICAL DATA:  Patient with cough for 1 month. EXAM: CHEST - 2 VIEW COMPARISON:  Chest radiograph 01/04/2018 FINDINGS: Monitoring leads overlie the patient. Stable cardiomegaly. Bibasilar atelectasis. No pleural effusion or pneumothorax. Thoracic spine degenerative changes. IMPRESSION: No acute cardiopulmonary process. Electronically Signed   By: Lovey Newcomer M.D.   On: 01/04/2019 18:07  Procedures Procedures (including critical care time)  Medications Ordered in ED Medications  doxycycline (VIBRA-TABS) tablet 100 mg (has no administration in time range)  albuterol (PROVENTIL) (2.5 MG/3ML) 0.083% nebulizer solution 5 mg (5 mg Nebulization Given 01/04/19 1722)  ipratropium-albuterol (DUONEB) 0.5-2.5 (3) MG/3ML nebulizer solution 3 mL (3 mLs Nebulization Given 01/04/19 1722)  methylPREDNISolone sodium succinate (SOLU-MEDROL) 125 mg/2 mL injection 125 mg (125 mg Intravenous Given 01/04/19 1736)  magnesium sulfate IVPB 2 g 50 mL (2 g Intravenous New Bag/Given 01/04/19 1738)     Initial Impression / Assessment and Plan / ED Course  I have reviewed the triage vital signs and the nursing notes.  Pertinent labs & imaging results that were available during my care of the patient were reviewed by me and considered in my medical decision making (see chart for details).    Patient with exacerbation of COPD.  She improved with neb treatment and steroids.  She will be sent home with prednisone doxycycline and Tussionex and will follow-up with her PCP  Final Clinical Impressions(s) / ED Diagnoses   Final diagnoses:  Cough    ED Discharge Orders         Ordered    predniSONE (DELTASONE) 10 MG tablet  Daily     01/04/19 1913     doxycycline (VIBRAMYCIN) 100 MG capsule  2 times daily     01/04/19 1913    chlorpheniramine-HYDROcodone (TUSSIONEX PENNKINETIC ER) 10-8 MG/5ML SUER  Every 12 hours PRN     01/04/19 1913           Milton Ferguson, MD 01/04/19 1916

## 2019-01-04 NOTE — ED Triage Notes (Signed)
Cough x one month Supposed to see PCP on Tuesday has appt DrAustin  Has hx of CAP

## 2019-01-04 NOTE — Discharge Instructions (Signed)
Use your inhaler every 6 hours as needed for cough and shortness of breath.  Follow-up with your doctor in 2 to 3 days for recheck

## 2019-05-19 ENCOUNTER — Telehealth: Payer: Self-pay | Admitting: Family

## 2019-06-15 ENCOUNTER — Encounter (HOSPITAL_COMMUNITY): Payer: Self-pay

## 2019-06-15 ENCOUNTER — Other Ambulatory Visit: Payer: Self-pay

## 2019-06-15 ENCOUNTER — Emergency Department (HOSPITAL_COMMUNITY)
Admission: EM | Admit: 2019-06-15 | Discharge: 2019-06-15 | Disposition: A | Discharge status: Home / Self Care | Payer: Medicaid Other | Attending: Emergency Medicine | Admitting: Emergency Medicine

## 2019-06-15 DIAGNOSIS — Z79899 Other long term (current) drug therapy: Secondary | ICD-10-CM | POA: Insufficient documentation

## 2019-06-15 DIAGNOSIS — M5441 Lumbago with sciatica, right side: Secondary | ICD-10-CM | POA: Diagnosis not present

## 2019-06-15 DIAGNOSIS — J449 Chronic obstructive pulmonary disease, unspecified: Secondary | ICD-10-CM | POA: Diagnosis not present

## 2019-06-15 DIAGNOSIS — M545 Low back pain: Secondary | ICD-10-CM | POA: Diagnosis present

## 2019-06-15 DIAGNOSIS — Z87891 Personal history of nicotine dependence: Secondary | ICD-10-CM | POA: Diagnosis not present

## 2019-06-15 LAB — URINALYSIS, ROUTINE W REFLEX MICROSCOPIC
Bilirubin Urine: NEGATIVE
Glucose, UA: NEGATIVE mg/dL
Ketones, ur: 5 mg/dL — AB
Leukocytes,Ua: NEGATIVE
Nitrite: NEGATIVE
Protein, ur: NEGATIVE mg/dL
Specific Gravity, Urine: 1.023 (ref 1.005–1.030)
pH: 5 (ref 5.0–8.0)

## 2019-06-15 LAB — BASIC METABOLIC PANEL
Anion gap: 10 (ref 5–15)
BUN: 16 mg/dL (ref 6–20)
CO2: 27 mmol/L (ref 22–32)
Calcium: 9.5 mg/dL (ref 8.9–10.3)
Chloride: 102 mmol/L (ref 98–111)
Creatinine, Ser: 0.72 mg/dL (ref 0.44–1.00)
GFR calc Af Amer: >60 mL/min (ref >60)
GFR calc non Af Amer: >60 mL/min (ref >60)
Glucose, Bld: 99 mg/dL (ref 70–99)
Potassium: 4.2 mmol/L (ref 3.5–5.1)
Sodium: 139 mmol/L (ref 135–145)

## 2019-06-15 LAB — CBC
HCT: 53.3 % — ABNORMAL HIGH (ref 36.0–46.0)
Hemoglobin: 17 g/dL — ABNORMAL HIGH (ref 12.0–15.0)
MCH: 31.5 pg (ref 26.0–34.0)
MCHC: 31.9 g/dL (ref 30.0–36.0)
MCV: 98.7 fL (ref 80.0–100.0)
Platelets: 231 10*3/uL (ref 150–400)
RBC: 5.4 MIL/uL — ABNORMAL HIGH (ref 3.87–5.11)
RDW: 12.2 % (ref 11.5–15.5)
WBC: 11.3 10*3/uL — ABNORMAL HIGH (ref 4.0–10.5)
nRBC: 0 % (ref 0.0–0.2)

## 2019-06-15 MED ORDER — KETOROLAC TROMETHAMINE 60 MG/2ML IM SOLN
60.0000 mg | Freq: Once | INTRAMUSCULAR | Status: AC
  Administered 2019-06-15: 60 mg via INTRAMUSCULAR
  Filled 2019-06-15: qty 2

## 2019-06-15 MED ORDER — CYCLOBENZAPRINE HCL 10 MG PO TABS: 10.0000 mg | ORAL_TABLET | Freq: Two times a day (BID) | ORAL | 0 refills | Status: DC | PRN

## 2019-06-15 MED ORDER — DICLOFENAC SODIUM 50 MG PO TBEC: 50.0000 mg | DELAYED_RELEASE_TABLET | Freq: Two times a day (BID) | ORAL | 0 refills | Status: DC

## 2019-06-15 NOTE — ED Notes (Signed)
Pt given ginger ale upon request 

## 2019-06-15 NOTE — ED Notes (Signed)
EKG complete.

## 2019-06-15 NOTE — ED Provider Notes (Signed)
[facility] EMERGENCY DEPARTMENT Provider Note   CSN: [id number] Arrival date & time: 06/15/19  1652    History   Chief Complaint Chief Complaint  Patient presents with  . Shortness of Breath    HPI Emily Fitzgerald is a 58 y.o. female.     Patient is most concerned about pain in her lower back that she noted approximately a week ago after twisting and bending awkwardly.  She does have COPD and the warm humid weather has been bothering her, but this is not unusual.  No fever, sweats, chills, productive cough, chest pain, dyspnea, dysuria.  She states her respiratory pattern is normal in the emergency department.  Review of systems positive for radicular pain in the distribution of the right sciatic nerve.  No bowel or bladder incontinence.  Severity is minimal to moderate.  Palpation position make pain worse.     Past Medical History:  Diagnosis Date  . COPD (chronic obstructive pulmonary disease) [facility])     Patient Active Problem List   Diagnosis Date Noted  . Transaminasemia   . COPD with acute exacerbation ([place]) 01/08/2018  . Influenza with respiratory manifestation 01/08/2018  . Lobar pneumonia ([place]) 01/08/2018  . Influenza B 01/05/2018  . Community acquired pneumonia of left lower lobe of lung ([place]) 01/04/2018  . Acute respiratory failure with hypoxia ([place]) 01/04/2018  . Elevated LFTs 01/04/2018    Past Surgical History:  Procedure Laterality Date  . CESAREAN SECTION       OB History   No obstetric history on file.      Home Medications    Prior to Admission medications   Medication Sig Start Date End Date Taking? Authorizing Provider  albuterol (PROVENTIL HFA;VENTOLIN HFA) 108 (90 Base) MCG/ACT inhaler Inhale 1-2 puffs into the lungs every 6 (six) hours as needed for wheezing or shortness of breath.   Yes [provider]  gabapentin (NEURONTIN) 100 MG capsule Take 100 mg by mouth daily as needed (for pain).  10/02/18  Yes [provider]   ipratropium-albuterol (DUONEB) 0.5-2.5 (3) MG/3ML SOLN Take 3 mLs by nebulization every 6 (six) hours as needed. Patient taking differently: Take 3 mLs by nebulization every 6 (six) hours as needed (for shortness of breath).  01/10/18  Yes Tat, Shanon Brow, MD  OXYGEN Inhale 3 L into the lungs 3 (three) times a week.   Yes [provider]  cyclobenzaprine (FLEXERIL) 10 MG tablet Take 1 tablet (10 mg total) by mouth 2 (two) times daily as needed. 06/15/19   Nat Christen, MD  diclofenac (VOLTAREN) 50 MG EC tablet Take 1 tablet (50 mg total) by mouth 2 (two) times daily. 06/15/19   Nat Christen, MD    Family History History reviewed. No pertinent family history.  Social History Social History   Tobacco Use  . Smoking status: Former Research scientist (life sciences)  . Smokeless tobacco: Never Used  Substance Use Topics  . Alcohol use: No    Frequency: Never  . Drug use: No     Allergies   Patient has no known allergies.   Review of Systems Review of Systems  All other systems reviewed and are negative.    Physical Exam Updated Vital Signs BP 119/77 (BP Location: Right Arm)   Pulse (!) 105   Temp 98.3 F (36.8 C) (Oral)   Resp 16   Ht 5\' 6"  (1.676 m)   Wt 113.4 kg   SpO2 92% Comment: pt states she wears O2 PRN  BMI 40.35 kg/m  Physical Exam Vitals signs and nursing note reviewed.  Constitutional:      Appearance: She is well-developed.  HENT:     Head: Normocephalic and atraumatic.  Eyes:     Conjunctiva/sclera: Conjunctivae normal.  Neck:     Musculoskeletal: Neck supple.  Cardiovascular:     Rate and Rhythm: Normal rate and regular rhythm.  Pulmonary:     Effort: Pulmonary effort is normal.     Breath sounds: Normal breath sounds.     Comments: No respiratory distress. Abdominal:     General: Bowel sounds are normal.     Palpations: Abdomen is soft.  Musculoskeletal:     Comments: Minimal tenderness in the lower back right greater than left.  Minimal pain with straight leg raise  on the right.  Skin:    General: Skin is warm and dry.  Neurological:     Mental Status: She is alert and oriented to person, place, and time.  Psychiatric:        Behavior: Behavior normal.      ED Treatments / Results  Labs (all labs ordered are listed, but only abnormal results are displayed) Labs Reviewed  CBC - Abnormal; Notable for the following components:      Result Value   WBC 11.3 (*)    RBC 5.40 (*)    Hemoglobin 17.0 (*)    HCT 53.3 (*)    All other components within normal limits  URINALYSIS, ROUTINE W REFLEX MICROSCOPIC - Abnormal; Notable for the following components:   APPearance HAZY (*)    Hgb urine dipstick SMALL (*)    Ketones, ur 5 (*)    Bacteria, UA RARE (*)    All other components within normal limits  BASIC METABOLIC PANEL    EKG EKG Interpretation  Date/Time:  Monday June 15 2019 21:51:50 EDT Ventricular Rate:  95 PR Interval:    QRS Duration: 86 QT Interval:  351 QTC Calculation: 442 R Axis:   -65 Text Interpretation:  Sinus rhythm Left anterior fascicular block Anterior infarct, old ST elevation, consider inferior injury Baseline wander in lead(s) V6 Confirmed by Nat Christen [phone]) on 06/15/2019 10:38:03 PM   Radiology No results found.  Procedures Procedures (including critical care time)  Medications Ordered in ED Medications - No data to display   Initial Impression / Assessment and Plan / ED Course  I have reviewed the triage vital signs and the nursing notes.  Pertinent labs & imaging results that were available during my care of the patient were reviewed by me and considered in my medical decision making (see chart for details).        Patient is most concerned with her lower back pain.  COPD status is stable.  No obvious urine infection.  Toradol 60 mg IM.  Discharge medication Flexeril 10 mg and diclofenac 50 mg  Final Clinical Impressions(s) / ED Diagnoses   Final diagnoses:  Acute low back pain with right-sided  sciatica, unspecified back pain laterality    ED Discharge Orders         Ordered    cyclobenzaprine (FLEXERIL) 10 MG tablet  2 times daily PRN     06/15/19 2239    diclofenac (VOLTAREN) 50 MG EC tablet  2 times daily     06/15/19 2239           Nat Christen, MD 06/16/19 1609

## 2019-06-15 NOTE — Discharge Instructions (Addendum)
Tests were good.  Suspect a muscle strain to your lower back.  Prescription for muscle relaxer and anti-inflammatory medicine E prescribed to your pharmacy.  Follow-up with your primary care doctor.

## 2019-06-15 NOTE — ED Triage Notes (Signed)
Pt states hx of COPD and has had trouble breathing recently due to high temperatures outside, also complains of lower left back pain with movement.

## 2019-06-29 ENCOUNTER — Encounter (HOSPITAL_COMMUNITY): Payer: Self-pay | Admitting: Emergency Medicine

## 2019-06-29 ENCOUNTER — Emergency Department (HOSPITAL_COMMUNITY)
Admission: EM | Admit: 2019-06-29 | Discharge: 2019-06-29 | Disposition: A | Discharge status: Home / Self Care | Payer: Medicaid Other | Attending: Emergency Medicine | Admitting: Emergency Medicine

## 2019-06-29 ENCOUNTER — Other Ambulatory Visit: Payer: Self-pay

## 2019-06-29 ENCOUNTER — Emergency Department (HOSPITAL_COMMUNITY): Payer: Medicaid Other

## 2019-06-29 DIAGNOSIS — J449 Chronic obstructive pulmonary disease, unspecified: Secondary | ICD-10-CM | POA: Diagnosis not present

## 2019-06-29 DIAGNOSIS — N3001 Acute cystitis with hematuria: Secondary | ICD-10-CM | POA: Diagnosis not present

## 2019-06-29 DIAGNOSIS — Z20828 Contact with and (suspected) exposure to other viral communicable diseases: Secondary | ICD-10-CM | POA: Insufficient documentation

## 2019-06-29 DIAGNOSIS — Z87891 Personal history of nicotine dependence: Secondary | ICD-10-CM | POA: Insufficient documentation

## 2019-06-29 DIAGNOSIS — R509 Fever, unspecified: Secondary | ICD-10-CM | POA: Diagnosis present

## 2019-06-29 LAB — CBC WITH DIFFERENTIAL/PLATELET
Abs Immature Granulocytes: 0.06 10*3/uL (ref 0.00–0.07)
Basophils Absolute: 0 10*3/uL (ref 0.0–0.1)
Basophils Relative: 0 %
Eosinophils Absolute: 0 10*3/uL (ref 0.0–0.5)
Eosinophils Relative: 0 %
HCT: 51.5 % — ABNORMAL HIGH (ref 36.0–46.0)
Hemoglobin: 16.7 g/dL — ABNORMAL HIGH (ref 12.0–15.0)
Immature Granulocytes: 1 %
Lymphocytes Relative: 15 %
Lymphs Abs: 1.9 10*3/uL (ref 0.7–4.0)
MCH: 32.1 pg (ref 26.0–34.0)
MCHC: 32.4 g/dL (ref 30.0–36.0)
MCV: 98.8 fL (ref 80.0–100.0)
Monocytes Absolute: 0.6 10*3/uL (ref 0.1–1.0)
Monocytes Relative: 5 %
Neutro Abs: 10 10*3/uL — ABNORMAL HIGH (ref 1.7–7.7)
Neutrophils Relative %: 79 %
Platelets: 207 10*3/uL (ref 150–400)
RBC: 5.21 MIL/uL — ABNORMAL HIGH (ref 3.87–5.11)
RDW: 11.8 % (ref 11.5–15.5)
WBC: 12.7 10*3/uL — ABNORMAL HIGH (ref 4.0–10.5)
nRBC: 0 % (ref 0.0–0.2)

## 2019-06-29 LAB — URINALYSIS, ROUTINE W REFLEX MICROSCOPIC
Bilirubin Urine: NEGATIVE
Glucose, UA: NEGATIVE mg/dL
Ketones, ur: 5 mg/dL — AB
Nitrite: POSITIVE — AB
Protein, ur: 100 mg/dL — AB
Specific Gravity, Urine: 1.02 (ref 1.005–1.030)
WBC, UA: >50 WBC/hpf — ABNORMAL HIGH (ref 0–5)
pH: 5 (ref 5.0–8.0)

## 2019-06-29 LAB — COMPREHENSIVE METABOLIC PANEL
ALT: 16 U/L (ref 0–44)
AST: 13 U/L — ABNORMAL LOW (ref 15–41)
Albumin: 3.4 g/dL — ABNORMAL LOW (ref 3.5–5.0)
Alkaline Phosphatase: 83 U/L (ref 38–126)
Anion gap: 11 (ref 5–15)
BUN: 11 mg/dL (ref 6–20)
CO2: 25 mmol/L (ref 22–32)
Calcium: 9 mg/dL (ref 8.9–10.3)
Chloride: 97 mmol/L — ABNORMAL LOW (ref 98–111)
Creatinine, Ser: 0.82 mg/dL (ref 0.44–1.00)
GFR calc Af Amer: >60 mL/min (ref >60)
GFR calc non Af Amer: >60 mL/min (ref >60)
Glucose, Bld: 127 mg/dL — ABNORMAL HIGH (ref 70–99)
Potassium: 3.7 mmol/L (ref 3.5–5.1)
Sodium: 133 mmol/L — ABNORMAL LOW (ref 135–145)
Total Bilirubin: 0.8 mg/dL (ref 0.3–1.2)
Total Protein: 7.3 g/dL (ref 6.5–8.1)

## 2019-06-29 LAB — PROTIME-INR: Prothrombin Time: <10 seconds — ABNORMAL LOW (ref 11.4–15.2)

## 2019-06-29 LAB — SARS CORONAVIRUS 2 BY RT PCR (HOSPITAL ORDER, PERFORMED IN ~~LOC~~ HOSPITAL LAB): SARS Coronavirus 2: NEGATIVE

## 2019-06-29 LAB — APTT: aPTT: 26 seconds (ref 24–36)

## 2019-06-29 LAB — LACTIC ACID, PLASMA
Lactic Acid, Venous: 1.2 mmol/L (ref 0.5–1.9)
Lactic Acid, Venous: 0.6 mmol/L (ref 0.5–1.9)

## 2019-06-29 LAB — POC URINE PREG, ED: Preg Test, Ur: NEGATIVE

## 2019-06-29 MED ORDER — VANCOMYCIN HCL IN DEXTROSE 1-5 GM/200ML-% IV SOLN
1000.0000 mg | Freq: Once | INTRAVENOUS | Status: AC
  Administered 2019-06-29: 1000 mg via INTRAVENOUS
  Filled 2019-06-29: qty 200

## 2019-06-29 MED ORDER — CEPHALEXIN 500 MG PO CAPS: 500.0000 mg | ORAL_CAPSULE | Freq: Four times a day (QID) | ORAL | 0 refills | Status: DC

## 2019-06-29 MED ORDER — ACETAMINOPHEN 325 MG PO TABS
650.0000 mg | ORAL_TABLET | Freq: Once | ORAL | Status: AC
  Administered 2019-06-29: 650 mg via ORAL
  Filled 2019-06-29: qty 2

## 2019-06-29 MED ORDER — PIPERACILLIN-TAZOBACTAM 3.375 G IVPB 30 MIN
3.3750 g | Freq: Once | INTRAVENOUS | Status: AC
  Administered 2019-06-29: 3.375 g via INTRAVENOUS
  Filled 2019-06-29: qty 50

## 2019-06-29 MED ORDER — SODIUM CHLORIDE 0.9 % IV BOLUS
3000.0000 mL | Freq: Once | INTRAVENOUS | Status: AC
  Administered 2019-06-29: 3000 mL via INTRAVENOUS

## 2019-06-29 NOTE — Discharge Instructions (Addendum)
Drink plenty of fluids and follow up with your md next week °

## 2019-06-29 NOTE — ED Notes (Signed)
Pt given water 

## 2019-06-29 NOTE — ED Notes (Addendum)
RN reviewed d/c papers with patient

## 2019-06-29 NOTE — ED Notes (Signed)
Both sets of blood cultures obtained  

## 2019-06-29 NOTE — ED Provider Notes (Addendum)
[facility] EMERGENCY DEPARTMENT Provider Note   CSN: [id number] Arrival date & time: 06/29/19  1724     History   Chief Complaint Chief Complaint  Patient presents with  . Fever    HPI Emily Fitzgerald is a 58 y.o. female.     Pt with fever cough and right flank pain  The history is provided by the patient. No language interpreter was used.  Fever Max temp prior to arrival:  102.2 Temp source:  Oral Severity:  Severe Onset quality:  Sudden Timing:  Constant Progression:  Worsening Chronicity:  New Relieved by:  Nothing Worsened by:  Nothing Associated symptoms: cough   Associated symptoms: no chest pain, no congestion, no diarrhea, no headaches and no rash     Past Medical History:  Diagnosis Date  . COPD (chronic obstructive pulmonary disease) [facility])     Patient Active Problem List   Diagnosis Date Noted  . Transaminasemia   . COPD with acute exacerbation ([place]) 01/08/2018  . Influenza with respiratory manifestation 01/08/2018  . Lobar pneumonia ([place]) 01/08/2018  . Influenza B 01/05/2018  . Community acquired pneumonia of left lower lobe of lung ([place]) 01/04/2018  . Acute respiratory failure with hypoxia ([place]) 01/04/2018  . Elevated LFTs 01/04/2018    Past Surgical History:  Procedure Laterality Date  . CESAREAN SECTION       OB History   No obstetric history on file.      Home Medications    Prior to Admission medications   Medication Sig Start Date End Date Taking? Authorizing Provider  albuterol (PROVENTIL HFA;VENTOLIN HFA) 108 (90 Base) MCG/ACT inhaler Inhale 1-2 puffs into the lungs every 6 (six) hours as needed for wheezing or shortness of breath.    [provider]  cyclobenzaprine (FLEXERIL) 10 MG tablet Take 1 tablet (10 mg total) by mouth 2 (two) times daily as needed. 06/15/19   Nat Christen, MD  diclofenac (VOLTAREN) 50 MG EC tablet Take 1 tablet (50 mg total) by mouth 2 (two) times daily. 06/15/19   Nat Christen, MD  gabapentin  (NEURONTIN) 100 MG capsule Take 100 mg by mouth daily as needed (for pain).  10/02/18   [provider]  ipratropium-albuterol (DUONEB) 0.5-2.5 (3) MG/3ML SOLN Take 3 mLs by nebulization every 6 (six) hours as needed. Patient taking differently: Take 3 mLs by nebulization every 6 (six) hours as needed (for shortness of breath).  01/10/18   Orson Eva, MD  OXYGEN Inhale 3 L into the lungs 3 (three) times a week.    [provider]    Family History No family history on file.  Social History Social History   Tobacco Use  . Smoking status: Former Research scientist (life sciences)  . Smokeless tobacco: Never Used  Substance Use Topics  . Alcohol use: No    Frequency: Never  . Drug use: No     Allergies   Patient has no known allergies.   Review of Systems Review of Systems  Constitutional: Positive for fever. Negative for appetite change and fatigue.  HENT: Negative for congestion, ear discharge and sinus pressure.   Eyes: Negative for discharge.  Respiratory: Positive for cough.   Cardiovascular: Negative for chest pain.  Gastrointestinal: Negative for abdominal pain and diarrhea.  Genitourinary: Negative for frequency and hematuria.       Right flank pain  Musculoskeletal: Negative for back pain.  Skin: Negative for rash.  Neurological: Negative for seizures and headaches.  Psychiatric/Behavioral: Negative for hallucinations.  Physical Exam Updated Vital Signs BP 137/73   Pulse (!) 103   Temp 99.5 F (37.5 C) (Oral)   Resp 17   Ht 5\' 6"  (1.676 m)   Wt 113.4 kg   SpO2 95%   BMI 40.35 kg/m   Physical Exam Vitals signs and nursing note reviewed.  Constitutional:      Appearance: She is well-developed.  HENT:     Head: Normocephalic.     Nose: Nose normal.  Eyes:     General: No scleral icterus.    Conjunctiva/sclera: Conjunctivae normal.  Neck:     Musculoskeletal: Neck supple.     Thyroid: No thyromegaly.  Cardiovascular:     Rate and Rhythm: Normal rate and  regular rhythm.     Heart sounds: No murmur. No friction rub. No gallop.   Pulmonary:     Breath sounds: No stridor. No wheezing or rales.  Chest:     Chest wall: No tenderness.  Abdominal:     General: There is no distension.     Tenderness: There is no abdominal tenderness. There is no rebound.  Musculoskeletal: Normal range of motion.  Lymphadenopathy:     Cervical: No cervical adenopathy.  Skin:    Findings: No erythema or rash.  Neurological:     Mental Status: She is oriented to person, place, and time.     Motor: No abnormal muscle tone.     Coordination: Coordination normal.  Psychiatric:        Behavior: Behavior normal.      ED Treatments / Results  Labs (all labs ordered are listed, but only abnormal results are displayed) Labs Reviewed  COMPREHENSIVE METABOLIC PANEL - Abnormal; Notable for the following components:      Result Value   Sodium 133 (*)    Chloride 97 (*)    Glucose, Bld 127 (*)    Albumin 3.4 (*)    AST 13 (*)    All other components within normal limits  CBC WITH DIFFERENTIAL/PLATELET - Abnormal; Notable for the following components:   WBC 12.7 (*)    RBC 5.21 (*)    Hemoglobin 16.7 (*)    HCT 51.5 (*)    Neutro Abs 10.0 (*)    All other components within normal limits  PROTIME-INR - Abnormal; Notable for the following components:   Prothrombin Time <10.0 (*)    All other components within normal limits  URINALYSIS, ROUTINE W REFLEX MICROSCOPIC - Abnormal; Notable for the following components:   Color, Urine AMBER (*)    APPearance CLOUDY (*)    Hgb urine dipstick MODERATE (*)    Ketones, ur 5 (*)    Protein, ur 100 (*)    Nitrite POSITIVE (*)    Leukocytes,Ua LARGE (*)    WBC, UA >50 (*)    Bacteria, UA RARE (*)    All other components within normal limits  CULTURE, BLOOD (ROUTINE X 2)  CULTURE, BLOOD (ROUTINE X 2)  SARS CORONAVIRUS 2 (HOSPITAL ORDER, [place] [facility])  URINE CULTURE  LACTIC ACID, PLASMA   APTT  LACTIC ACID, PLASMA  POC URINE PREG, ED    EKG None  Radiology Dg Chest Port 1 View  Result Date: 06/29/2019 CLINICAL DATA:  Fever and nonproductive cough EXAM: PORTABLE CHEST 1 VIEW COMPARISON:  01/04/2019 FINDINGS: The heart size and mediastinal contours are within normal limits. Both lungs are clear. The visualized skeletal structures are unremarkable. IMPRESSION: No active disease. Electronically Signed  By: Inez Catalina M.D.   On: 06/29/2019 19:03    Procedures Procedures (including critical care time)  Medications Ordered in ED Medications  sodium chloride 0.9 % bolus 3,000 mL (0 mLs Intravenous Stopped 06/29/19 1949)  vancomycin (VANCOCIN) IVPB 1000 mg/200 mL premix (0 mg Intravenous Stopped 06/29/19 2024)  piperacillin-tazobactam (ZOSYN) IVPB 3.375 g (0 g Intravenous Stopped 06/29/19 1904)  acetaminophen (TYLENOL) tablet 650 mg (650 mg Oral Given 06/29/19 1753)     Initial Impression / Assessment and Plan / ED Course  I have reviewed the triage vital signs and the nursing notes.  Pertinent labs & imaging results that were available during my care of the patient were reviewed by me and considered in my medical decision making (see chart for details).        Pt with uti and fever.  Admit for iv antibiotics,   Pt has now decided to go home and take out patient antibiotics.  She will be given keflex and follow up Final Clinical Impressions(s) / ED Diagnoses   Final diagnoses:  Acute cystitis with hematuria    ED Discharge Orders    None       Milton Ferguson, MD 06/29/19 2059    Milton Ferguson, MD 06/29/19 2139

## 2019-06-29 NOTE — ED Triage Notes (Signed)
Pt c/o fever, chills, non-productive cough, runny nose, and RT sided flank pain that began yesterday. Pt HR 139 and sats 89% on RA in triage.

## 2019-06-30 ENCOUNTER — Telehealth (HOSPITAL_BASED_OUTPATIENT_CLINIC_OR_DEPARTMENT_OTHER): Payer: Self-pay | Admitting: Emergency Medicine

## 2019-06-30 LAB — BLOOD CULTURE ID PANEL (REFLEXED)

## 2019-07-02 LAB — URINE CULTURE: Culture: >=100000 — AB

## 2019-07-02 LAB — CULTURE, BLOOD (ROUTINE X 2): Special Requests: ADEQUATE

## 2019-07-04 LAB — CULTURE, BLOOD (ROUTINE X 2)
Culture: NO GROWTH
Special Requests: ADEQUATE

## 2020-01-17 ENCOUNTER — Emergency Department (HOSPITAL_COMMUNITY): Payer: Medicaid Other

## 2020-01-17 ENCOUNTER — Other Ambulatory Visit: Payer: Self-pay

## 2020-01-17 ENCOUNTER — Encounter (HOSPITAL_COMMUNITY): Payer: Self-pay | Admitting: Emergency Medicine

## 2020-01-17 ENCOUNTER — Emergency Department (HOSPITAL_COMMUNITY)
Admission: EM | Admit: 2020-01-17 | Discharge: 2020-01-18 | Disposition: A | Discharge status: Home / Self Care | Payer: Medicaid Other | Attending: Emergency Medicine | Admitting: Emergency Medicine

## 2020-01-17 DIAGNOSIS — R0602 Shortness of breath: Secondary | ICD-10-CM | POA: Insufficient documentation

## 2020-01-17 DIAGNOSIS — Z9981 Dependence on supplemental oxygen: Secondary | ICD-10-CM | POA: Diagnosis not present

## 2020-01-17 DIAGNOSIS — R2243 Localized swelling, mass and lump, lower limb, bilateral: Secondary | ICD-10-CM | POA: Insufficient documentation

## 2020-01-17 DIAGNOSIS — R7989 Other specified abnormal findings of blood chemistry: Secondary | ICD-10-CM | POA: Insufficient documentation

## 2020-01-17 DIAGNOSIS — J449 Chronic obstructive pulmonary disease, unspecified: Secondary | ICD-10-CM | POA: Insufficient documentation

## 2020-01-17 DIAGNOSIS — Z79899 Other long term (current) drug therapy: Secondary | ICD-10-CM | POA: Diagnosis not present

## 2020-01-17 DIAGNOSIS — R600 Localized edema: Secondary | ICD-10-CM

## 2020-01-17 LAB — BASIC METABOLIC PANEL
Anion gap: 8 (ref 5–15)
BUN: 12 mg/dL (ref 6–20)
CO2: 32 mmol/L (ref 22–32)
Calcium: 8.6 mg/dL — ABNORMAL LOW (ref 8.9–10.3)
Chloride: 100 mmol/L (ref 98–111)
Creatinine, Ser: 0.74 mg/dL (ref 0.44–1.00)
GFR calc Af Amer: >60 mL/min (ref >60)
GFR calc non Af Amer: >60 mL/min (ref >60)
Glucose, Bld: 92 mg/dL (ref 70–99)
Potassium: 4 mmol/L (ref 3.5–5.1)
Sodium: 140 mmol/L (ref 135–145)

## 2020-01-17 LAB — HEPATIC FUNCTION PANEL
ALT: 20 U/L (ref 0–44)
AST: 15 U/L (ref 15–41)
Albumin: 3.2 g/dL — ABNORMAL LOW (ref 3.5–5.0)
Alkaline Phosphatase: 70 U/L (ref 38–126)
Bilirubin, Direct: 0.2 mg/dL (ref 0.0–0.2)
Indirect Bilirubin: 0.5 mg/dL (ref 0.3–0.9)
Total Bilirubin: 0.7 mg/dL (ref 0.3–1.2)
Total Protein: 6.2 g/dL — ABNORMAL LOW (ref 6.5–8.1)

## 2020-01-17 LAB — BRAIN NATRIURETIC PEPTIDE: B Natriuretic Peptide: 1372 pg/mL — ABNORMAL HIGH (ref 0.0–100.0)

## 2020-01-17 LAB — CBC
HCT: 53.4 % — ABNORMAL HIGH (ref 36.0–46.0)
Hemoglobin: 16 g/dL — ABNORMAL HIGH (ref 12.0–15.0)
MCH: 31.6 pg (ref 26.0–34.0)
MCHC: 30 g/dL (ref 30.0–36.0)
MCV: 105.5 fL — ABNORMAL HIGH (ref 80.0–100.0)
Platelets: 223 10*3/uL (ref 150–400)
RBC: 5.06 MIL/uL (ref 3.87–5.11)
RDW: 14.4 % (ref 11.5–15.5)
WBC: 7.4 10*3/uL (ref 4.0–10.5)
nRBC: 0.3 % — ABNORMAL HIGH (ref 0.0–0.2)

## 2020-01-17 MED ORDER — FUROSEMIDE 20 MG PO TABS: 20.0000 mg | ORAL_TABLET | Freq: Every day | ORAL | 0 refills | Status: DC

## 2020-01-17 MED ORDER — FUROSEMIDE 40 MG PO TABS
20.0000 mg | ORAL_TABLET | Freq: Once | ORAL | Status: AC
  Administered 2020-01-18: 20 mg via ORAL
  Filled 2020-01-17: qty 1

## 2020-01-17 NOTE — ED Triage Notes (Signed)
Patient states that she has had bilateral swelling to her hands arms legs and feet x 2 days. Patient denies any chest pain or shortness of breath.

## 2020-01-17 NOTE — Discharge Instructions (Addendum)
I suspect the swelling in your legs is related to new heart failure.  Fortunately you do not have any fluid buildup on your lungs today.  Please begin taking Lasix once daily, this medicine will help remove fluid from your legs but will make you urinate more frequently.  You can also wear compression stockings these can be purchased at the drugstore, and elevate your legs.  Please follow-up with your primary care doctor within the next week for recheck of how you are doing on this new medication, you will need to be scheduled outpatient for an ultrasound of your heart.  Return to the emergency department if you develop shortness of breath, chest pain, worsening swelling in your legs or any other new or concerning symptoms.

## 2020-01-17 NOTE — ED Notes (Signed)
Pt used albuterol inhaler from home after ok'd with EDP.

## 2020-01-17 NOTE — ED Provider Notes (Signed)
[facility] EMERGENCY DEPARTMENT Provider Note   CSN: [id number] Arrival date & time: 01/17/20  2044     History Chief Complaint  Patient presents with  . Leg Swelling    arm swelling    Emily Fitzgerald is a 59 y.o. female.  Emily Fitzgerald is a 59 y.o. female with a history of COPD, who presents to the emergency department for evaluation of swelling to bilateral lower extremities.  Mentions some swelling in her arms and hands in triage, but denies this in the room states minimal swelling if any she is most concerned about the swelling in her legs and feet.  She denies any new shortness of breath, has shortness of breath at baseline from her COPD and wears up to 3 L of oxygen at home as needed.  She denies any associated chest pain.  She reports she has never had swelling like this in her legs before.  She occasionally has had some swelling in her left ankle related to a previous ankle fracture.  She reports leg swelling is not painful she denies any calf pain or tenderness.  Denies any abdominal swelling, or abdominal pain.  No history of liver disease.  No known history of heart problems or heart failure.  No other aggravating or alleviating factors.  The history is provided by the patient.       Past Medical History:  Diagnosis Date  . COPD (chronic obstructive pulmonary disease) [facility])     Patient Active Problem List   Diagnosis Date Noted  . Transaminasemia   . COPD with acute exacerbation ([place]) 01/08/2018  . Influenza with respiratory manifestation 01/08/2018  . Lobar pneumonia ([place]) 01/08/2018  . Influenza B 01/05/2018  . Community acquired pneumonia of left lower lobe of lung 01/04/2018  . Acute respiratory failure with hypoxia ([place]) 01/04/2018  . Elevated LFTs 01/04/2018    Past Surgical History:  Procedure Laterality Date  . CESAREAN SECTION       OB History   No obstetric history on file.     History reviewed. No pertinent family history.  Social History    Tobacco Use  . Smoking status: Former Research scientist (life sciences)  . Smokeless tobacco: Never Used  Substance Use Topics  . Alcohol use: No  . Drug use: No    Home Medications Prior to Admission medications   Medication Sig Start Date End Date Taking? Authorizing Provider  albuterol (PROVENTIL HFA;VENTOLIN HFA) 108 (90 Base) MCG/ACT inhaler Inhale 1-2 puffs into the lungs every 6 (six) hours as needed for wheezing or shortness of breath.   Yes [provider]  cyclobenzaprine (FLEXERIL) 10 MG tablet Take 1 tablet (10 mg total) by mouth 2 (two) times daily as needed. 06/15/19  Yes Nat Christen, MD  gabapentin (NEURONTIN) 100 MG capsule Take 100 mg by mouth daily as needed (for pain).  10/02/18  Yes [provider]  ipratropium-albuterol (DUONEB) 0.5-2.5 (3) MG/3ML SOLN Take 3 mLs by nebulization every 6 (six) hours as needed. Patient taking differently: Take 3 mLs by nebulization 2 (two) times daily.  01/10/18  Yes Tat, Shanon Brow, MD  furosemide (LASIX) 20 MG tablet Take 1 tablet (20 mg total) by mouth daily. 01/17/20   Jacqlyn Larsen, PA-C  OXYGEN Inhale 3 L into the lungs 3 (three) times a week.    [provider]    Allergies    Patient has no known allergies.  Review of Systems   Review of Systems  Constitutional: Negative for chills and fever.  HENT: Negative.   Respiratory: Negative for cough and shortness of breath.   Cardiovascular: Positive for leg swelling. Negative for chest pain.  Gastrointestinal: Negative for abdominal pain, nausea and vomiting.  Genitourinary: Negative for dysuria and frequency.  Musculoskeletal: Negative for arthralgias and myalgias.  Skin: Negative for color change and rash.  Neurological: Negative for dizziness, syncope and light-headedness.    Physical Exam Updated Vital Signs BP (!) 156/85 (BP Location: Left Arm)   Pulse (!) 104   Temp 98.2 F (36.8 C) (Oral)   Resp 18   Ht 5\' 7"  (1.702 m)   Wt 122.5 kg   SpO2 90%   BMI 42.29 kg/m    Physical Exam Vitals and nursing note reviewed.  Constitutional:      General: She is not in acute distress.    Appearance: Normal appearance. She is well-developed. She is not diaphoretic.     Comments: Well-appearing and in no distress  HENT:     Head: Normocephalic and atraumatic.     Mouth/Throat:     Mouth: Mucous membranes are moist.     Pharynx: Oropharynx is clear.  Eyes:     General:        Right eye: No discharge.        Left eye: No discharge.  Cardiovascular:     Rate and Rhythm: Normal rate and regular rhythm.     Pulses: Normal pulses.     Heart sounds: Normal heart sounds. No murmur. No friction rub. No gallop.   Pulmonary:     Effort: Pulmonary effort is normal. No respiratory distress.     Breath sounds: Normal breath sounds. No wheezing or rales.     Comments: Respirations equal and unlabored, patient able to speak in full sentences, lungs clear to auscultation bilaterally Abdominal:     General: Bowel sounds are normal. There is no distension.     Palpations: Abdomen is soft. There is no mass.     Tenderness: There is no abdominal tenderness. There is no guarding.     Comments: Abdomen soft, nondistended, nontender to palpation in all quadrants without guarding or peritoneal signs  Musculoskeletal:        General: No deformity.     Cervical back: Neck supple.     Right lower leg: Edema present.     Left lower leg: Edema present.     Comments: Bilateral lower extremities with 2+ pitting edema up to the midshin, lower extremities are warm perfused with 2+ distal pulses. No calf tenderness  Skin:    General: Skin is warm and dry.     Capillary Refill: Capillary refill takes less than 2 seconds.  Neurological:     Mental Status: She is alert.     Coordination: Coordination normal.     Comments: Speech is clear, able to follow commands Moves extremities without ataxia, coordination intact  Psychiatric:        Mood and Affect: Mood normal.        Behavior:  Behavior normal.     ED Results / Procedures / Treatments   Labs (all labs ordered are listed, but only abnormal results are displayed) Labs Reviewed  CBC - Abnormal; Notable for the following components:      Result Value   Hemoglobin 16.0 (*)    HCT 53.4 (*)    MCV 105.5 (*)    nRBC 0.3 (*)    All other components within normal limits  BASIC METABOLIC PANEL - Abnormal; Notable  for the following components:   Calcium 8.6 (*)    All other components within normal limits  BRAIN NATRIURETIC PEPTIDE - Abnormal; Notable for the following components:   B Natriuretic Peptide 1,372.0 (*)    All other components within normal limits  HEPATIC FUNCTION PANEL - Abnormal; Notable for the following components:   Total Protein 6.2 (*)    Albumin 3.2 (*)    All other components within normal limits    EKG None  Radiology DG Chest Port 1 View  Result Date: 01/17/2020 CLINICAL DATA:  Lower extremity edema. Question congestive heart failure. EXAM: PORTABLE CHEST 1 VIEW COMPARISON:  06/29/2019 FINDINGS: Heart size upper limits of normal allowing for technique. The pulmonary vascularity is within normal limits for the lungs are clear. No effusions. No significant bone finding. IMPRESSION: No active disease. Electronically Signed   By: Nelson Chimes M.D.   On: 01/17/2020 21:46    Procedures Procedures (including critical care time)  Medications Ordered in ED Medications  furosemide (LASIX) tablet 20 mg (has no administration in time range)    ED Course  I have reviewed the triage vital signs and the nursing notes.  Pertinent labs & imaging results that were available during my care of the patient were reviewed by me and considered in my medical decision making (see chart for details).    MDM Rules/Calculators/A&P                      59 year old female presents with 2 days of bilateral lower extremity edema, no associated chest pain or shortness of breath, patient is overall  well-appearing, she is mildly tachycardic at 104, mildly hypertensive, vitals otherwise normal.  2+ pitting edema noted to bilateral extremities, concern for possible new-onset heart failure, although I am reassured the patient is not having any chest pain or shortness of breath, she does not have any rales on lung exam.  Will check basic labs, BMP, chest x-ray, EKG.  Lab work shows no leukocytosis, stable hemoglobin, no electrolyte derangements, normal renal function.  BNP is elevated at 1372.  Fortunately chest x-ray showed no evidence of pulmonary edema and EKG was unremarkable.  I suspect new onset heart failure but given that patient has no respiratory distress or pulmonary edema feel that this can be followed as an outpatient, will start patient on Lasix and encouraged her to use compression stockings and elevate her legs.  I have asked her to follow-up with her PCP within the next week, strict return precautions discussed.  She expresses understanding and agreement with plan.  Discharged home in good condition.  Patient discussed with Dr. Lacinda Axon, who saw patient as well and agrees with plan.   Final Clinical Impression(s) / ED Diagnoses Final diagnoses:  Bilateral lower extremity edema  Elevated brain natriuretic peptide (BNP) level    Rx / DC Orders ED Discharge Orders         Ordered    furosemide (LASIX) 20 MG tablet  Daily     01/17/20 2313           Jacqlyn Larsen, [place] 01/21/20 0759    Nat Christen, MD 01/22/20 [phone]

## 2020-02-23 ENCOUNTER — Other Ambulatory Visit (HOSPITAL_COMMUNITY): Payer: Self-pay | Admitting: General Practice

## 2020-02-23 DIAGNOSIS — Z1231 Encounter for screening mammogram for malignant neoplasm of breast: Secondary | ICD-10-CM

## 2020-03-04 ENCOUNTER — Other Ambulatory Visit: Payer: Self-pay

## 2020-03-04 ENCOUNTER — Ambulatory Visit (HOSPITAL_COMMUNITY)
Admission: RE | Admit: 2020-03-04 | Discharge: 2020-03-04 | Disposition: A | Discharge status: Home / Self Care | Payer: Medicaid Other | Source: Ambulatory Visit | Attending: General Practice | Admitting: General Practice

## 2020-03-04 DIAGNOSIS — Z1231 Encounter for screening mammogram for malignant neoplasm of breast: Secondary | ICD-10-CM | POA: Diagnosis present

## 2020-03-08 ENCOUNTER — Other Ambulatory Visit (HOSPITAL_COMMUNITY): Payer: Self-pay | Admitting: General Practice

## 2020-03-10 ENCOUNTER — Other Ambulatory Visit (HOSPITAL_COMMUNITY): Payer: Self-pay | Admitting: General Practice

## 2020-03-14 ENCOUNTER — Other Ambulatory Visit (HOSPITAL_COMMUNITY): Payer: Self-pay | Admitting: General Practice

## 2020-03-14 DIAGNOSIS — R928 Other abnormal and inconclusive findings on diagnostic imaging of breast: Secondary | ICD-10-CM

## 2020-03-29 ENCOUNTER — Ambulatory Visit (HOSPITAL_COMMUNITY)
Admission: RE | Admit: 2020-03-29 | Discharge: 2020-03-29 | Disposition: A | Discharge status: Home / Self Care | Payer: Medicaid Other | Source: Ambulatory Visit | Attending: General Practice | Admitting: General Practice

## 2020-03-29 ENCOUNTER — Other Ambulatory Visit: Payer: Self-pay

## 2020-03-29 DIAGNOSIS — R928 Other abnormal and inconclusive findings on diagnostic imaging of breast: Secondary | ICD-10-CM

## 2020-04-04 ENCOUNTER — Other Ambulatory Visit: Payer: Self-pay | Admitting: General Practice

## 2020-04-04 DIAGNOSIS — R921 Mammographic calcification found on diagnostic imaging of breast: Secondary | ICD-10-CM

## 2020-05-17 ENCOUNTER — Other Ambulatory Visit: Payer: Self-pay

## 2020-05-17 ENCOUNTER — Ambulatory Visit
Admission: RE | Admit: 2020-05-17 | Discharge: 2020-05-17 | Disposition: A | Discharge status: Home / Self Care | Payer: Medicaid Other | Source: Ambulatory Visit | Attending: General Practice | Admitting: General Practice

## 2020-05-17 DIAGNOSIS — R921 Mammographic calcification found on diagnostic imaging of breast: Secondary | ICD-10-CM

## 2020-06-15 ENCOUNTER — Inpatient Hospital Stay (HOSPITAL_COMMUNITY)
Admission: EM | Admit: 2020-06-15 | Discharge: 2020-06-17 | DRG: 190 | Disposition: A | Discharge status: Home / Self Care | Payer: Medicaid Other | Attending: Family Medicine | Admitting: Family Medicine

## 2020-06-15 ENCOUNTER — Other Ambulatory Visit: Payer: Self-pay

## 2020-06-15 ENCOUNTER — Emergency Department (HOSPITAL_COMMUNITY): Payer: Medicaid Other

## 2020-06-15 ENCOUNTER — Encounter (HOSPITAL_COMMUNITY): Payer: Self-pay | Admitting: Emergency Medicine

## 2020-06-15 DIAGNOSIS — J9621 Acute and chronic respiratory failure with hypoxia: Secondary | ICD-10-CM | POA: Diagnosis present

## 2020-06-15 DIAGNOSIS — Z7951 Long term (current) use of inhaled steroids: Secondary | ICD-10-CM | POA: Diagnosis not present

## 2020-06-15 DIAGNOSIS — R030 Elevated blood-pressure reading, without diagnosis of hypertension: Secondary | ICD-10-CM | POA: Diagnosis present

## 2020-06-15 DIAGNOSIS — J441 Chronic obstructive pulmonary disease with (acute) exacerbation: Secondary | ICD-10-CM | POA: Diagnosis present

## 2020-06-15 DIAGNOSIS — Z20822 Contact with and (suspected) exposure to covid-19: Secondary | ICD-10-CM | POA: Diagnosis present

## 2020-06-15 DIAGNOSIS — Z79899 Other long term (current) drug therapy: Secondary | ICD-10-CM

## 2020-06-15 DIAGNOSIS — J9601 Acute respiratory failure with hypoxia: Principal | ICD-10-CM | POA: Diagnosis present

## 2020-06-15 DIAGNOSIS — Z9981 Dependence on supplemental oxygen: Secondary | ICD-10-CM

## 2020-06-15 DIAGNOSIS — E876 Hypokalemia: Secondary | ICD-10-CM | POA: Diagnosis present

## 2020-06-15 DIAGNOSIS — R350 Frequency of micturition: Secondary | ICD-10-CM | POA: Diagnosis present

## 2020-06-15 DIAGNOSIS — J449 Chronic obstructive pulmonary disease, unspecified: Secondary | ICD-10-CM | POA: Diagnosis present

## 2020-06-15 DIAGNOSIS — Z87891 Personal history of nicotine dependence: Secondary | ICD-10-CM | POA: Diagnosis not present

## 2020-06-15 LAB — BASIC METABOLIC PANEL
Anion gap: 10 (ref 5–15)
BUN: 6 mg/dL (ref 6–20)
CO2: 33 mmol/L — ABNORMAL HIGH (ref 22–32)
Calcium: 9 mg/dL (ref 8.9–10.3)
Chloride: 93 mmol/L — ABNORMAL LOW (ref 98–111)
Creatinine, Ser: 0.62 mg/dL (ref 0.44–1.00)
GFR calc Af Amer: >60 mL/min (ref >60)
GFR calc non Af Amer: >60 mL/min (ref >60)
Glucose, Bld: 99 mg/dL (ref 70–99)
Potassium: 3.4 mmol/L — ABNORMAL LOW (ref 3.5–5.1)
Sodium: 136 mmol/L (ref 135–145)

## 2020-06-15 LAB — CBC WITH DIFFERENTIAL/PLATELET
Abs Immature Granulocytes: 0.02 10*3/uL (ref 0.00–0.07)
Basophils Absolute: 0 10*3/uL (ref 0.0–0.1)
Basophils Relative: 0 %
Eosinophils Absolute: 0.2 10*3/uL (ref 0.0–0.5)
Eosinophils Relative: 3 %
HCT: 45.8 % (ref 36.0–46.0)
Hemoglobin: 14.8 g/dL (ref 12.0–15.0)
Immature Granulocytes: 0 %
Lymphocytes Relative: 33 %
Lymphs Abs: 2.3 10*3/uL (ref 0.7–4.0)
MCH: 32.2 pg (ref 26.0–34.0)
MCHC: 32.3 g/dL (ref 30.0–36.0)
MCV: 99.6 fL (ref 80.0–100.0)
Monocytes Absolute: 0.5 10*3/uL (ref 0.1–1.0)
Monocytes Relative: 7 %
Neutro Abs: 3.9 10*3/uL (ref 1.7–7.7)
Neutrophils Relative %: 57 %
Platelets: 242 10*3/uL (ref 150–400)
RBC: 4.6 MIL/uL (ref 3.87–5.11)
RDW: 11.8 % (ref 11.5–15.5)
WBC: 6.9 10*3/uL (ref 4.0–10.5)
nRBC: 0 % (ref 0.0–0.2)

## 2020-06-15 LAB — BLOOD GAS, VENOUS
Acid-Base Excess: 10.1 mmol/L — ABNORMAL HIGH (ref 0.0–2.0)
Bicarbonate: 31.5 mmol/L — ABNORMAL HIGH (ref 20.0–28.0)
FIO2: 52
O2 Saturation: 79.7 %
Patient temperature: 36.7
pCO2, Ven: 61.5 mmHg — ABNORMAL HIGH (ref 44.0–60.0)
pH, Ven: 7.379 (ref 7.250–7.430)
pO2, Ven: 47 mmHg — ABNORMAL HIGH (ref 32.0–45.0)

## 2020-06-15 LAB — SARS CORONAVIRUS 2 BY RT PCR (HOSPITAL ORDER, PERFORMED IN ~~LOC~~ HOSPITAL LAB): SARS Coronavirus 2: NEGATIVE

## 2020-06-15 MED ORDER — AEROCHAMBER Z-STAT PLUS/MEDIUM MISC
1.0000 | Freq: Once | Status: AC
  Administered 2020-06-15: 1

## 2020-06-15 MED ORDER — POTASSIUM CHLORIDE CRYS ER 20 MEQ PO TBCR
20.0000 meq | EXTENDED_RELEASE_TABLET | Freq: Once | ORAL | Status: AC
  Administered 2020-06-16: 20 meq via ORAL
  Filled 2020-06-15: qty 1

## 2020-06-15 MED ORDER — ALBUTEROL SULFATE HFA 108 (90 BASE) MCG/ACT IN AERS
2.0000 | INHALATION_SPRAY | Freq: Once | RESPIRATORY_TRACT | Status: AC
  Administered 2020-06-15: 2 via RESPIRATORY_TRACT
  Filled 2020-06-15: qty 6.7

## 2020-06-15 MED ORDER — ALBUTEROL (5 MG/ML) CONTINUOUS INHALATION SOLN
10.0000 mg/h | INHALATION_SOLUTION | Freq: Once | RESPIRATORY_TRACT | Status: AC
  Administered 2020-06-15: 10 mg/h via RESPIRATORY_TRACT
  Filled 2020-06-15: qty 20

## 2020-06-15 MED ORDER — METHYLPREDNISOLONE SODIUM SUCC 125 MG IJ SOLR
125.0000 mg | Freq: Once | INTRAMUSCULAR | Status: AC
  Administered 2020-06-15: 125 mg via INTRAVENOUS
  Filled 2020-06-15: qty 2

## 2020-06-15 MED ORDER — HYDRALAZINE HCL 20 MG/ML IJ SOLN: 10.0000 mg | Freq: Four times a day (QID) | INTRAMUSCULAR | Status: DC | PRN

## 2020-06-15 MED ORDER — DOXYCYCLINE HYCLATE 100 MG PO TABS
100.0000 mg | ORAL_TABLET | Freq: Once | ORAL | Status: AC
  Administered 2020-06-15: 100 mg via ORAL
  Filled 2020-06-15: qty 1

## 2020-06-15 NOTE — H&P (Addendum)
TRH H&P    Patient Demographics:    Emily Fitzgerald, is a 59 y.o. female  MRN: [id number]  DOB - 1961-11-09  Admit Date - 06/15/2020  Referring MD/NP/PA: Loma Sousa  Outpatient Primary MD for the patient is [facility]  Patient coming from: Home  Chief complaint- Dyspnea, cough, congestion   HPI:    Emily Fitzgerald  is a 59 y.o. female, with history of COPD presents to the ED with a chief complaint of cough, rhinorrhea, and shortness of breath.  Patient reports his symptoms started suddenly 2 days ago.  She reports that she has been through this and of times that she knew that she had to come into the ED.  She has had her flu shot this year she reports.  She has not had a Covid vaccine and refuses to get one.  Patient reports that her cough is productive of clear sputum.  She is a former smoker and does have a morning cough.  But this cough is different than that cough because it is lasting all day and it is more intense.  Patient reports that she has had congestion.  She has an associated headache-in the same location in the same intensity as her normal sinus congestion headache.  Patient reports subjective fever describing that she woke up diaphoretic and chilled in the night.  Her dyspnea is worse with walking.  She does not notice any worsening of the symptoms while talking.  She does have as needed oxygen at home.  She was wearing that today at 3 L.  She is used her rescue inhaler today as well.  She continued to have shortness of breath.  She has not used her nebulizer today.  She reports decreased appetite.  She did not eat breakfast or lunch today.  She is hungry now.  Patient also has urinary frequency.  She reports no dysuria.  The urinary frequency started 1 month ago.  She is on Lasix 20 mg.  ED course Temp 88.8, blood pressure 136/83, 94% on 5 L nasal cannula, heart rate 88, respiratory  rate 18. No leukocytosis no anemia CHEM panel shows hypokalemia at 3.4 Elevated bicarbonate 33 Covid negative EKG shows sinus rhythm heart rate 88, QTc 463  Chest x-ray no acute cardiopulmonary abnormality VBG pending    Review of systems:    In addition to the HPI above,  Subjective fever-chills, Positive for sinus headache, No changes with Vision or hearing, No problems swallowing food or Liquids, No Chest pain, positive for cough and shortness of Breath, No Abdominal pain, No Nausea or Vomiting, bowel movements are regular, No Blood in stool or Urine, No dysuria, positive for urinary frequency No new skin rashes or bruises, No new joints pains-aches,  No new weakness, tingling, numbness in any extremity, No recent weight gain intentional weight loss of 90 pounds over the last year. No polydypsia or polyphagia, No significant Mental Stressors.  All other systems reviewed and are negative.    Past History of the following :    Past Medical  History:  Diagnosis Date  . COPD (chronic obstructive pulmonary disease) ([place])       Past Surgical History:  Procedure Laterality Date  . CESAREAN SECTION        Social History:      Social History   Tobacco Use  . Smoking status: Former Research scientist (life sciences)  . Smokeless tobacco: Never Used  Substance Use Topics  . Alcohol use: No       Family History :    No family history on file. Family history of hypertension   Home Medications:   Prior to Admission medications   Medication Sig Start Date End Date Taking? Authorizing Provider  albuterol (PROVENTIL HFA;VENTOLIN HFA) 108 (90 Base) MCG/ACT inhaler Inhale 1-2 puffs into the lungs every 6 (six) hours as needed for wheezing or shortness of breath.    [provider]  cyclobenzaprine (FLEXERIL) 10 MG tablet Take 1 tablet (10 mg total) by mouth 2 (two) times daily as needed. 06/15/19   Nat Christen, MD  furosemide (LASIX) 20 MG tablet Take 1 tablet (20 mg total) by mouth  daily. 01/17/20   Jacqlyn Larsen, PA-C  gabapentin (NEURONTIN) 100 MG capsule Take 100 mg by mouth daily as needed (for pain).  10/02/18   [provider]  ipratropium-albuterol (DUONEB) 0.5-2.5 (3) MG/3ML SOLN Take 3 mLs by nebulization every 6 (six) hours as needed. Patient taking differently: Take 3 mLs by nebulization 2 (two) times daily.  01/10/18   Orson Eva, MD  OXYGEN Inhale 3 L into the lungs 3 (three) times a week.    [provider]     Allergies:    No Known Allergies   Physical Exam:   Vitals  Blood pressure 136/83, pulse 88, temperature 98.8 F (37.1 C), temperature source Oral, resp. rate 18, SpO2 93 %.  1.  General: Up ambulating upon entry into room  2. Psychiatric: Mood and behavior normal  3. Neurologic: Cranial nerves II through XII are grossly intact, moves all 4 extremities voluntarily, oriented x3  4. HEENMT:  Head is atraumatic, normocephalic, pupils are reactive to light, trachea is midline, mucous membranes are moist  5. Respiratory : Upper airway congestion, wheezing in bilateral lungs, no rhonchi, no crackles, no accessory muscle use, nasal cannula in place 5 L  6. Cardiovascular : Heart rate and rhythm are regular, no murmur  7. Gastrointestinal:  Abdomen is soft, nontender, nondistended, bowel sounds present  8. Skin:  No acute rashes or lesions on limited skin exam  9.Musculoskeletal:  No peripheral edema or deformity    Data Review:    CBC Recent Labs  Lab 06/15/20 1954  WBC 6.9  HGB 14.8  HCT 45.8  PLT 242  MCV 99.6  MCH 32.2  MCHC 32.3  RDW 11.8  LYMPHSABS 2.3  MONOABS 0.5  EOSABS 0.2  BASOSABS 0.0   ------------------------------------------------------------------------------------------------------------------  Results for orders placed or performed during the hospital encounter of 06/15/20 (from the past 48 hour(s))  SARS Coronavirus 2 by RT PCR (hospital order, performed in [facility] hospital  lab) Nasopharyngeal Nasopharyngeal Swab     Status: None   Collection Time: 06/15/20  7:50 PM   Specimen: Nasopharyngeal Swab  Result Value Ref Range   SARS Coronavirus 2 NEGATIVE NEGATIVE    Comment: (NOTE) SARS-CoV-2 target nucleic acids are NOT DETECTED.  The SARS-CoV-2 RNA is generally detectable in upper and lower respiratory specimens during the acute phase of infection. The lowest concentration of SARS-CoV-2 viral copies this assay can  detect is 250 copies / mL. A negative result does not preclude SARS-CoV-2 infection and should not be used as the sole basis for treatment or other patient management decisions.  A negative result may occur with improper specimen collection / handling, submission of specimen other than nasopharyngeal swab, presence of viral mutation(s) within the areas targeted by this assay, and inadequate number of viral copies (<250 copies / mL). A negative result must be combined with clinical observations, patient history, and epidemiological information.  Fact Sheet for Patients:   StrictlyIdeas.no  Fact Sheet for Healthcare Providers: BankingDealers.co.za  This test is not yet approved or  cleared by the [place] FDA and has been authorized for detection and/or diagnosis of SARS-CoV-2 by FDA under an Emergency Use Authorization (EUA).  This EUA will remain in effect (meaning this test can be used) for the duration of the COVID-19 declaration under Section 564(b)(1) of the Act, 21 U.S.C. section 360bbb-3(b)(1), unless the authorization is terminated or revoked sooner.  Performed at [facility], [address], [place], [place] [address]   CBC with Differential     Status: None   Collection Time: 06/15/20  7:54 PM  Result Value Ref Range   WBC 6.9 4.0 - 10.5 K/uL   RBC 4.60 3.87 - 5.11 MIL/uL   Hemoglobin 14.8 12.0 - 15.0 g/dL   HCT 45.8 36 - 46 %   MCV 99.6 80.0 - 100.0 fL   MCH 32.2 26.0 - 34.0  pg   MCHC 32.3 30.0 - 36.0 g/dL   RDW 11.8 11.5 - 15.5 %   Platelets 242 150 - 400 K/uL   nRBC 0.0 0.0 - 0.2 %   Neutrophils Relative % 57 %   Neutro Abs 3.9 1.7 - 7.7 K/uL   Lymphocytes Relative 33 %   Lymphs Abs 2.3 0.7 - 4.0 K/uL   Monocytes Relative 7 %   Monocytes Absolute 0.5 0 - 1 K/uL   Eosinophils Relative 3 %   Eosinophils Absolute 0.2 0 - 0 K/uL   Basophils Relative 0 %   Basophils Absolute 0.0 0 - 0 K/uL   Immature Granulocytes 0 %   Abs Immature Granulocytes 0.02 0.00 - 0.07 K/uL    Comment: Performed at [facility], [address], [place], [place] [address]  Basic metabolic panel     Status: Abnormal   Collection Time: 06/15/20  7:54 PM  Result Value Ref Range   Sodium 136 135 - 145 mmol/L   Potassium 3.4 (L) 3.5 - 5.1 mmol/L   Chloride 93 (L) 98 - 111 mmol/L   CO2 33 (H) 22 - 32 mmol/L   Glucose, Bld 99 70 - 99 mg/dL    Comment: Glucose reference range applies only to samples taken after fasting for at least 8 hours.   BUN 6 6 - 20 mg/dL   Creatinine, Ser 0.62 0.44 - 1.00 mg/dL   Calcium 9.0 8.9 - 10.3 mg/dL   GFR calc non Af Amer >60 >60 mL/min   GFR calc Af Amer >60 >60 mL/min   Anion gap 10 5 - 15    Comment: Performed at [facility], [address], [place],  [address]    Chemistries  Recent Labs  Lab 06/15/20 1954  NA 136  K 3.4*  CL 93*  CO2 33*  GLUCOSE 99  BUN 6  CREATININE 0.62  CALCIUM 9.0   ------------------------------------------------------------------------------------------------------------------  ------------------------------------------------------------------------------------------------------------------ GFR: CrCl cannot be calculated (Unknown ideal weight.). Liver Function Tests: No results  for input(s): AST, ALT, ALKPHOS, BILITOT, PROT, ALBUMIN in the last 168 hours. No results for input(s): LIPASE, AMYLASE in the last 168 hours. No results for input(s): AMMONIA in the last 168 hours. Coagulation Profile: No  results for input(s): INR, PROTIME in the last 168 hours. Cardiac Enzymes: No results for input(s): CKTOTAL, CKMB, CKMBINDEX, TROPONINI in the last 168 hours. BNP (last 3 results) No results for input(s): PROBNP in the last 8760 hours. HbA1C: No results for input(s): HGBA1C in the last 72 hours. CBG: No results for input(s): GLUCAP in the last 168 hours. Lipid Profile: No results for input(s): CHOL, HDL, LDLCALC, TRIG, CHOLHDL, LDLDIRECT in the last 72 hours. Thyroid Function Tests: No results for input(s): TSH, T4TOTAL, FREET4, T3FREE, THYROIDAB in the last 72 hours. Anemia Panel: No results for input(s): VITAMINB12, FOLATE, FERRITIN, TIBC, IRON, RETICCTPCT in the last 72 hours.  --------------------------------------------------------------------------------------------------------------- Urine analysis:    Component Value Date/Time   COLORURINE AMBER (A) 06/29/2019 1923   APPEARANCEUR CLOUDY (A) 06/29/2019 1923   LABSPEC 1.020 06/29/2019 1923   PHURINE 5.0 06/29/2019 1923   GLUCOSEU NEGATIVE 06/29/2019 1923   HGBUR MODERATE (A) 06/29/2019 1923   BILIRUBINUR NEGATIVE 06/29/2019 1923   KETONESUR 5 (A) 06/29/2019 1923   PROTEINUR 100 (A) 06/29/2019 1923   NITRITE POSITIVE (A) 06/29/2019 1923   LEUKOCYTESUR LARGE (A) 06/29/2019 1923      Imaging Results:    DG Chest Portable 1 View  Result Date: 06/15/2020 CLINICAL DATA:  Cough and congestion for 3 days EXAM: PORTABLE CHEST 1 VIEW COMPARISON:  Radiograph 01/17/2020 FINDINGS: Chronically coarsened interstitial changes with some relative apical lucency which may emphysema, similar to comparison. No focal consolidation or convincing features of edema. Pulmonary vascularity remains normally distributed. No pneumothorax or visible effusion. Prominent cardiac silhouette is likely accentuated by the portable technique. No acute osseous or soft tissue abnormality. Degenerative changes are present in the imaged spine and shoulders. Nasal  cannula overlies the chest. IMPRESSION: No acute cardiopulmonary abnormality. Chronically coarsened interstitial changes with hyperinflation and apical lucency suggesting emphysematous change and compatible with patient's known COPD. Electronically Signed   By: Lovena Le M.D.   On: 06/15/2020 19:48    My personal review of EKG: Rhythm NSR, Rate 88 /min, QTc 463 ,no Acute ST changes   Assessment & Plan:    Active Problems:   Acute respiratory failure with hypoxia (HCC)   1. Acute respiratory failure with hypoxia 1. O2 sats at 80% upon arrival requiring 5 L nasal cannula to normalize 2. With no leukocytosis 3. Chest x-ray shows no acute cardiopulmonary abnormality, findings consistent with emphysema 4. History is not suspicious for PE 5. COPD exacerbation is most likely etiology 6. EKG is without ischemic changes 2. Hypokalemia 1. Replace and recheck 2. Check Mag in the AM 3. COPD exacerbation 1. Albuterol q4 PRN 2. DuoNeb q6 scheduled 3. Solumedrol  4. Azithromycin 5. COPD focused order set utilized 4. Elevated blood pressure without the diagnosis of hypertension 1. In the setting of acute illness 2. PRN hydralazine 3. No oral antihypertensives started 5. Elevated Bicarb 1. Likely compensation in the setting of hypercapnia 2. VBG shows pH 7.379, PCO2 61 - likely her baseline given normal pH     DVT Prophylaxis-   Lovenox - SCDs   AM Labs Ordered, also please review Full Orders  Family Communication: No family at bedside Code Status:  Full  Admission status: Inpatient :The appropriate admission status for this patient is INPATIENT. Inpatient status is  judged to be reasonable and necessary in order to provide the required intensity of service to ensure the patient's safety. The patient's presenting symptoms, physical exam findings, and initial radiographic and laboratory data in the context of their chronic comorbidities is felt to place them at high risk for further  clinical deterioration. Furthermore, it is not anticipated that the patient will be medically stable for discharge from the hospital within 2 midnights of admission. The following factors support the admission status of inpatient.     The patient's presenting symptoms include Dyspnea The worrisome physical exam findings include Hypoxia, Wheezing The chronic co-morbidities include COPD        I certify that at the point of admission it is my clinical judgment that the patient will require inpatient hospital care spanning beyond 2 midnights from the point of admission due to high intensity of service, high risk for further deterioration and high frequency of surveillance required.*  Time spent in minutes : [place]

## 2020-06-15 NOTE — ED Provider Notes (Signed)
[facility] EMERGENCY DEPARTMENT Provider Note   CSN: [id number] Arrival date & time: 06/15/20  1655     History Chief Complaint  Patient presents with  . Cough    Emily Fitzgerald is a 59 y.o. female.  HPI   Pt is a 59 y/o female with a h/o COPD, CAP who presents to the ED today for eval of a cough for the last several days. Cough has been productive with clear/yellow sputum. She also reports some shortness of breath. Denies any current chest pain but does have some when she coughs hard. Denies vomiting. Had some diarrhea a few days ago. She reports associated subjective fevers, rhinorrhea, and headache.   Denies known COVID contacts. Has not been vaccinated.   Has PRN oxygen at home but has not needed to use it for a long time.   Past Medical History:  Diagnosis Date  . COPD (chronic obstructive pulmonary disease) [facility])     Patient Active Problem List   Diagnosis Date Noted  . Transaminasemia   . COPD with acute exacerbation ([place]) 01/08/2018  . Influenza with respiratory manifestation 01/08/2018  . Lobar pneumonia ([place]) 01/08/2018  . Influenza B 01/05/2018  . Community acquired pneumonia of left lower lobe of lung 01/04/2018  . Acute respiratory failure with hypoxia ([place]) 01/04/2018  . Elevated LFTs 01/04/2018    Past Surgical History:  Procedure Laterality Date  . CESAREAN SECTION       OB History   No obstetric history on file.     No family history on file.  Social History   Tobacco Use  . Smoking status: Former Research scientist (life sciences)  . Smokeless tobacco: Never Used  Vaping Use  . Vaping Use: Never used  Substance Use Topics  . Alcohol use: No  . Drug use: No    Home Medications Prior to Admission medications   Medication Sig Start Date End Date Taking? Authorizing Provider  albuterol (PROVENTIL HFA;VENTOLIN HFA) 108 (90 Base) MCG/ACT inhaler Inhale 1-2 puffs into the lungs every 6 (six) hours as needed for wheezing or shortness of breath.    [provider]  cyclobenzaprine (FLEXERIL) 10 MG tablet Take 1 tablet (10 mg total) by mouth 2 (two) times daily as needed. 06/15/19   Nat Christen, MD  furosemide (LASIX) 20 MG tablet Take 1 tablet (20 mg total) by mouth daily. 01/17/20   Jacqlyn Larsen, PA-C  gabapentin (NEURONTIN) 100 MG capsule Take 100 mg by mouth daily as needed (for pain).  10/02/18   [provider]  ipratropium-albuterol (DUONEB) 0.5-2.5 (3) MG/3ML SOLN Take 3 mLs by nebulization every 6 (six) hours as needed. Patient taking differently: Take 3 mLs by nebulization 2 (two) times daily.  01/10/18   Orson Eva, MD  OXYGEN Inhale 3 L into the lungs 3 (three) times a week.    [provider]    Allergies    Patient has no known allergies.  Review of Systems   Review of Systems  Constitutional: Positive for chills, diaphoresis and fever.  HENT: Positive for rhinorrhea. Negative for ear pain and sore throat (scratchy).   Eyes: Negative for pain and visual disturbance.  Respiratory: Positive for cough and shortness of breath.   Cardiovascular: Positive for chest pain (only with cough). Negative for palpitations.  Gastrointestinal: Positive for diarrhea. Negative for abdominal pain, nausea and vomiting.  Genitourinary: Negative for dysuria and hematuria.  Musculoskeletal: Negative for back pain.  Skin: Negative for rash.  Neurological: Positive for headaches (resolved).  Negative for syncope.  All other systems reviewed and are negative.   Physical Exam Updated Vital Signs BP 136/83 (BP Location: Right Arm)   Pulse 88   Temp 98.8 F (37.1 C) (Oral)   Resp 18   SpO2 93%   Physical Exam Vitals and nursing note reviewed.  Constitutional:      General: She is not in acute distress.    Appearance: She is well-developed. She is not toxic-appearing.     Comments: Chronically ill appearing  HENT:     Head: Normocephalic and atraumatic.  Eyes:     Conjunctiva/sclera: Conjunctivae normal.  Cardiovascular:      Rate and Rhythm: Normal rate and regular rhythm.     Heart sounds: No murmur heard.   Pulmonary:     Effort: Pulmonary effort is normal.     Breath sounds: Wheezing present. No rhonchi or rales.  Abdominal:     General: Bowel sounds are normal.     Palpations: Abdomen is soft.     Tenderness: There is no abdominal tenderness. There is no guarding or rebound.  Musculoskeletal:        General: No tenderness.     Cervical back: Neck supple.     Right lower leg: No edema.     Left lower leg: No edema.     Comments: Chronic lle deformity  Skin:    General: Skin is warm and dry.  Neurological:     Mental Status: She is alert.     ED Results / Procedures / Treatments   Labs (all labs ordered are listed, but only abnormal results are displayed) Labs Reviewed  BASIC METABOLIC PANEL - Abnormal; Notable for the following components:      Result Value   Potassium 3.4 (*)    Chloride 93 (*)    CO2 33 (*)    All other components within normal limits  SARS CORONAVIRUS 2 BY RT PCR (HOSPITAL ORDER, [facility])  CBC WITH DIFFERENTIAL/PLATELET  BLOOD GAS, VENOUS    EKG None  Radiology DG Chest Portable 1 View  Result Date: 06/15/2020 CLINICAL DATA:  Cough and congestion for 3 days EXAM: PORTABLE CHEST 1 VIEW COMPARISON:  Radiograph 01/17/2020 FINDINGS: Chronically coarsened interstitial changes with some relative apical lucency which may emphysema, similar to comparison. No focal consolidation or convincing features of edema. Pulmonary vascularity remains normally distributed. No pneumothorax or visible effusion. Prominent cardiac silhouette is likely accentuated by the portable technique. No acute osseous or soft tissue abnormality. Degenerative changes are present in the imaged spine and shoulders. Nasal cannula overlies the chest. IMPRESSION: No acute cardiopulmonary abnormality. Chronically coarsened interstitial changes with hyperinflation and apical lucency  suggesting emphysematous change and compatible with patient's known COPD. Electronically Signed   By: Lovena Le M.D.   On: 06/15/2020 19:48    Procedures Procedures (including critical care time) CRITICAL CARE Performed by: Rodney Booze   Total critical care time: 33 minutes  Critical care time was exclusive of separately billable procedures and treating other patients.  Critical care was necessary to treat or prevent imminent or life-threatening deterioration.  Critical care was time spent personally by me on the following activities: development of treatment plan with patient and/or surrogate as well as nursing, discussions with consultants, evaluation of patient's response to treatment, examination of patient, obtaining history from patient or surrogate, ordering and performing treatments and interventions, ordering and review of laboratory studies, ordering and review of radiographic studies, pulse  oximetry and re-evaluation of patient's condition.   Medications Ordered in ED Medications  methylPREDNISolone sodium succinate (SOLU-MEDROL) 125 mg/2 mL injection 125 mg (125 mg Intravenous Given 06/15/20 1947)  albuterol (VENTOLIN HFA) 108 (90 Base) MCG/ACT inhaler 2 puff (2 puffs Inhalation Given 06/15/20 1949)  aerochamber Z-Stat Plus/medium 1 each (1 each Other Given 06/15/20 1950)  doxycycline (VIBRA-TABS) tablet 100 mg (100 mg Oral Given 06/15/20 2138)  albuterol (PROVENTIL,VENTOLIN) solution continuous neb (10 mg/hr Nebulization Given 06/15/20 2155)    ED Course  I have reviewed the triage vital signs and the nursing notes.  Pertinent labs & imaging results that were available during my care of the patient were reviewed by me and considered in my medical decision making (see chart for details).    MDM Rules/Calculators/A&P                          59 year old female presenting for evaluation of cough, shortness of breath nasal congestion for the last 3 days.  Is on as  needed oxygen at home but does not normally require it.  On arrival she is satting 80% on room air.  She is placed on 3 L of oxygen however on my reevaluation she continues to be hypoxic therefore was increased to 5 L and sats improved to 93%.  On exam she has significant wheezing.  Will initiate work-up with labs, chest x-ray, EKG.  We will also order Solu-Medrol and albuterol.  Reviewed/interpreted labs CBC without leukocytosis or anemia BMP with mild hypokalemia, slightly elevated bicarbonate 33, otherwise reassuring Covid testing is Negative  EKG with normal sinus rhythm, left anterior fascicular block, age-indeterminate infarct and baseline wander leads in V3.  No STEMI.  CXR reviewed/interpreted -agree with radiology.  No acute cardiopulmonary abnormality. Chronically coarsened interstitial changes with hyperinflation and apical lucency suggesting emphysematous change and compatible with patient's known  Patient continues to be hypoxic after intervention with albuterol and steroids.  We will start her on a continuous neb.  We will also give doxycycline and plan for admission for acute hypoxic respiratory failure secondary to COPD exacerbation.  9:32 PM CONSULT with Dr. Clearence Ped with hospitalist service who accepts patient for admission.   Final Clinical Impression(s) / ED Diagnoses Final diagnoses:  Acute respiratory failure with hypoxia ([place])  COPD exacerbation [facility])    Rx / DC Orders ED Discharge Orders    None       Bishop Dublin 06/15/20 2213    Noemi Chapel, MD 06/15/20 2304

## 2020-06-15 NOTE — ED Notes (Signed)
Pt sitting up in bed, neb complete, pt satting 84% on room air, replaced 3 L O2 via [place] pt satting 91% on 3L

## 2020-06-15 NOTE — ED Triage Notes (Addendum)
Pt c/o of cough, nasal congestion, headache x 3 days. 80% oxygen on room air. Pt placed on 3 L [place]

## 2020-06-15 NOTE — ED Notes (Signed)
Pt satting 88% on 3 L, increased to 4L O2 via [place].  Also suggested pt reposition in bed so she is more sitting up and can have better lung expansion.

## 2020-06-16 DIAGNOSIS — E876 Hypokalemia: Secondary | ICD-10-CM

## 2020-06-16 DIAGNOSIS — J9601 Acute respiratory failure with hypoxia: Secondary | ICD-10-CM

## 2020-06-16 DIAGNOSIS — J441 Chronic obstructive pulmonary disease with (acute) exacerbation: Principal | ICD-10-CM

## 2020-06-16 LAB — COMPREHENSIVE METABOLIC PANEL
ALT: 12 U/L (ref 0–44)
AST: 15 U/L (ref 15–41)
Albumin: 3.2 g/dL — ABNORMAL LOW (ref 3.5–5.0)
Alkaline Phosphatase: 72 U/L (ref 38–126)
Anion gap: 11 (ref 5–15)
BUN: 9 mg/dL (ref 6–20)
CO2: 31 mmol/L (ref 22–32)
Calcium: 9 mg/dL (ref 8.9–10.3)
Chloride: 94 mmol/L — ABNORMAL LOW (ref 98–111)
Creatinine, Ser: 0.89 mg/dL (ref 0.44–1.00)
GFR calc Af Amer: >60 mL/min (ref >60)
GFR calc non Af Amer: >60 mL/min (ref >60)
Glucose, Bld: 175 mg/dL — ABNORMAL HIGH (ref 70–99)
Potassium: 3.7 mmol/L (ref 3.5–5.1)
Sodium: 136 mmol/L (ref 135–145)
Total Bilirubin: 0.3 mg/dL (ref 0.3–1.2)
Total Protein: 6.6 g/dL (ref 6.5–8.1)

## 2020-06-16 LAB — CBC WITH DIFFERENTIAL/PLATELET
Abs Immature Granulocytes: 0.02 10*3/uL (ref 0.00–0.07)
Basophils Absolute: 0 10*3/uL (ref 0.0–0.1)
Basophils Relative: 0 %
Eosinophils Absolute: 0 10*3/uL (ref 0.0–0.5)
Eosinophils Relative: 0 %
HCT: 45.4 % (ref 36.0–46.0)
Hemoglobin: 14.5 g/dL (ref 12.0–15.0)
Immature Granulocytes: 0 %
Lymphocytes Relative: 34 %
Lymphs Abs: 1.5 10*3/uL (ref 0.7–4.0)
MCH: 32 pg (ref 26.0–34.0)
MCHC: 31.9 g/dL (ref 30.0–36.0)
MCV: 100.2 fL — ABNORMAL HIGH (ref 80.0–100.0)
Monocytes Absolute: 0.1 10*3/uL (ref 0.1–1.0)
Monocytes Relative: 2 %
Neutro Abs: 2.9 10*3/uL (ref 1.7–7.7)
Neutrophils Relative %: 64 %
Platelets: 229 10*3/uL (ref 150–400)
RBC: 4.53 MIL/uL (ref 3.87–5.11)
RDW: 11.8 % (ref 11.5–15.5)
WBC: 4.5 10*3/uL (ref 4.0–10.5)
nRBC: 0 % (ref 0.0–0.2)

## 2020-06-16 LAB — URINALYSIS, ROUTINE W REFLEX MICROSCOPIC
Bilirubin Urine: NEGATIVE
Glucose, UA: >=500 mg/dL — AB
Ketones, ur: NEGATIVE mg/dL
Leukocytes,Ua: NEGATIVE
Nitrite: NEGATIVE
Protein, ur: NEGATIVE mg/dL
Specific Gravity, Urine: 1.006 (ref 1.005–1.030)
pH: 6 (ref 5.0–8.0)

## 2020-06-16 LAB — MAGNESIUM: Magnesium: 1.9 mg/dL (ref 1.7–2.4)

## 2020-06-16 LAB — HIV ANTIBODY (ROUTINE TESTING W REFLEX): HIV Screen 4th Generation wRfx: NONREACTIVE

## 2020-06-16 MED ORDER — ALBUTEROL SULFATE HFA 108 (90 BASE) MCG/ACT IN AERS: 2.0000 | INHALATION_SPRAY | RESPIRATORY_TRACT | Status: DC | PRN

## 2020-06-16 MED ORDER — METHYLPREDNISOLONE SODIUM SUCC 125 MG IJ SOLR
60.0000 mg | Freq: Two times a day (BID) | INTRAMUSCULAR | Status: DC
  Administered 2020-06-16 – 2020-06-17 (×3): 60 mg via INTRAVENOUS
  Filled 2020-06-16 (×3): qty 2

## 2020-06-16 MED ORDER — AZITHROMYCIN 250 MG PO TABS: 500.0000 mg | ORAL_TABLET | ORAL | Status: DC

## 2020-06-16 MED ORDER — FUROSEMIDE 20 MG PO TABS
20.0000 mg | ORAL_TABLET | Freq: Every day | ORAL | Status: DC
  Administered 2020-06-16 – 2020-06-17 (×2): 20 mg via ORAL
  Filled 2020-06-16 (×2): qty 1

## 2020-06-16 MED ORDER — GUAIFENESIN ER 600 MG PO TB12
600.0000 mg | ORAL_TABLET | Freq: Two times a day (BID) | ORAL | Status: DC
  Administered 2020-06-16 – 2020-06-17 (×4): 600 mg via ORAL
  Filled 2020-06-16 (×4): qty 1

## 2020-06-16 MED ORDER — IBUPROFEN 400 MG PO TABS: 400.0000 mg | ORAL_TABLET | Freq: Four times a day (QID) | ORAL | Status: DC | PRN

## 2020-06-16 MED ORDER — IPRATROPIUM-ALBUTEROL 0.5-2.5 (3) MG/3ML IN SOLN
3.0000 mL | Freq: Four times a day (QID) | RESPIRATORY_TRACT | Status: DC
  Administered 2020-06-16 – 2020-06-17 (×6): 3 mL via RESPIRATORY_TRACT
  Filled 2020-06-16 (×6): qty 3

## 2020-06-16 MED ORDER — SENNOSIDES-DOCUSATE SODIUM 8.6-50 MG PO TABS: 1.0000 | ORAL_TABLET | Freq: Every evening | ORAL | Status: DC | PRN

## 2020-06-16 MED ORDER — TRAZODONE HCL 50 MG PO TABS: 25.0000 mg | ORAL_TABLET | Freq: Every evening | ORAL | Status: DC | PRN

## 2020-06-16 MED ORDER — GABAPENTIN 100 MG PO CAPS: 100.0000 mg | ORAL_CAPSULE | Freq: Every day | ORAL | Status: DC | PRN

## 2020-06-16 MED ORDER — ONDANSETRON HCL 4 MG/2ML IJ SOLN: 4.0000 mg | Freq: Four times a day (QID) | INTRAMUSCULAR | Status: DC | PRN

## 2020-06-16 MED ORDER — ALBUTEROL SULFATE (2.5 MG/3ML) 0.083% IN NEBU: 2.5000 mg | INHALATION_SOLUTION | RESPIRATORY_TRACT | Status: DC | PRN

## 2020-06-16 MED ORDER — HYDROCOD POLST-CPM POLST ER 10-8 MG/5ML PO SUER
5.0000 mL | Freq: Two times a day (BID) | ORAL | Status: DC | PRN
  Administered 2020-06-16 – 2020-06-17 (×2): 5 mL via ORAL
  Filled 2020-06-16 (×2): qty 5

## 2020-06-16 MED ORDER — PANTOPRAZOLE SODIUM 40 MG PO TBEC
40.0000 mg | DELAYED_RELEASE_TABLET | Freq: Every day | ORAL | Status: DC
  Administered 2020-06-17: 40 mg via ORAL
  Filled 2020-06-16: qty 1

## 2020-06-16 MED ORDER — ONDANSETRON HCL 4 MG PO TABS: 4.0000 mg | ORAL_TABLET | Freq: Four times a day (QID) | ORAL | Status: DC | PRN

## 2020-06-16 MED ORDER — SODIUM CHLORIDE 0.9 % IV SOLN
500.0000 mg | INTRAVENOUS | Status: AC
  Administered 2020-06-16: 500 mg via INTRAVENOUS
  Filled 2020-06-16: qty 500

## 2020-06-16 MED ORDER — SALINE SPRAY 0.65 % NA SOLN: 2.0000 | NASAL | Status: DC | PRN

## 2020-06-16 MED ORDER — ENOXAPARIN SODIUM 40 MG/0.4ML ~~LOC~~ SOLN
40.0000 mg | SUBCUTANEOUS | Status: DC
  Administered 2020-06-16 – 2020-06-17 (×2): 40 mg via SUBCUTANEOUS
  Filled 2020-06-16 (×2): qty 0.4

## 2020-06-16 NOTE — Progress Notes (Signed)
PROGRESS NOTE   Emily Fitzgerald  UUV:[id number] DOB: 04-24-1961 DOA: 06/15/2020 PCP: [facility]   Chief Complaint  Patient presents with  . Cough    Brief Admission History:  59 y.o. female, with history of COPD presents to the ED with a chief complaint of cough, rhinorrhea, and shortness of breath.  Patient reports his symptoms started suddenly 2 days ago.  She reports that she has been through this and of times that she knew that she had to come into the ED.  She has had her flu shot this year she reports.  She has not had a Covid vaccine and refuses to get one.  Patient reports that her cough is productive of clear sputum.  She is a former smoker and does have a morning cough.  Assessment & Plan:   Principal Problem:   Acute respiratory failure with hypoxia (HCC) Active Problems:   COPD with acute exacerbation ([place])  1. Acute on chronic respiratory failure with hypoxia - Pt reports that she continues to have severe SOB and speaking in short sentences and coughing severely asking for cough suppressants, secondary to COPD exacerbation which is being treated below.  2. CoPD with acute exacerbation -continue IV steroids, scheduled nebulizer treatments, and antibiotics as ordered.  3. Hypokalemia - repleted.   DVT prophylaxis:  Enoxaparin  Code Status: full  Family Communication:  Disposition:   Status is: Inpatient  Remains inpatient appropriate because:Inpatient level of care appropriate due to severity of illness  Dispo: The patient is from: Home              Anticipated d/c is to: Home              Anticipated d/c date is: 1 day              Patient currently is not medically stable to d/c.  Consultants:     Procedures:     Antimicrobials:  Azithromycin 7/21>   Subjective: Pt complains of severe SOB, speaking in short sentences and phrases, coughing diffusely asking for cough suppressant  Objective: Vitals:   06/16/20 0300 06/16/20 0523  06/16/20 0750 06/16/20 1423  BP:  (!) 141/81  (!) 148/74  Pulse:  95  89  Resp:  20  18  Temp:  98.3 F (36.8 C)  98.1 F (36.7 C)  TempSrc:  Oral    SpO2: 93% 92% 94% 93%  Weight:      Height:        Intake/Output Summary (Last 24 hours) at 06/16/2020 1523 Last data filed at 06/16/2020 1500 Gross per 24 hour  Intake 600 ml  Output --  Net 600 ml   Filed Weights   06/16/20 0105  Weight: 92 kg   Examination:  General exam: Appears calm and comfortable  Respiratory system: diffuse exp wheezes heard, Respiratory effort normal. Cardiovascular system: S1 & S2 heard, RRR. No JVD, murmurs, rubs, gallops or clicks. No pedal edema. Gastrointestinal system: Abdomen is nondistended, soft and nontender. No organomegaly or masses felt. Normal bowel sounds heard. Central nervous system: Alert and oriented. No focal neurological deficits. Extremities: Symmetric 5 x 5 power. Skin: No rashes, lesions or ulcers Psychiatry: Judgement and insight appear normal. Mood & affect appropriate.   Data Reviewed: I have personally reviewed following labs and imaging studies  CBC: Recent Labs  Lab 06/15/20 1954 06/16/20 0444  WBC 6.9 4.5  NEUTROABS 3.9 2.9  HGB 14.8 14.5  HCT 45.8 45.4  MCV 99.6 100.2*  PLT 242 324    Basic Metabolic Panel: Recent Labs  Lab 06/15/20 1954 06/16/20 0444  NA 136 136  K 3.4* 3.7  CL 93* 94*  CO2 33* 31  GLUCOSE 99 175*  BUN 6 9  CREATININE 0.62 0.89  CALCIUM 9.0 9.0  MG  --  1.9    GFR: Estimated Creatinine Clearance: 79.7 mL/min (by C-G formula based on SCr of 0.89 mg/dL).  Liver Function Tests: Recent Labs  Lab 06/16/20 0444  AST 15  ALT 12  ALKPHOS 72  BILITOT 0.3  PROT 6.6  ALBUMIN 3.2*    CBG: No results for input(s): GLUCAP in the last 168 hours.  Recent Results (from the past 240 hour(s))  SARS Coronavirus 2 by RT PCR (hospital order, performed in [facility] [place] hospital lab) Nasopharyngeal Nasopharyngeal Swab     Status: None    Collection Time: 06/15/20  7:50 PM   Specimen: Nasopharyngeal Swab  Result Value Ref Range Status   SARS Coronavirus 2 NEGATIVE NEGATIVE Final    Comment: (NOTE) SARS-CoV-2 target nucleic acids are NOT DETECTED.  The SARS-CoV-2 RNA is generally detectable in upper and lower respiratory specimens during the acute phase of infection. The lowest concentration of SARS-CoV-2 viral copies this assay can detect is 250 copies / mL. A negative result does not preclude SARS-CoV-2 infection and should not be used as the sole basis for treatment or other patient management decisions.  A negative result may occur with improper specimen collection / handling, submission of specimen other than nasopharyngeal swab, presence of viral mutation(s) within the areas targeted by this assay, and inadequate number of viral copies (<250 copies / mL). A negative result must be combined with clinical observations, patient history, and epidemiological information.  Fact Sheet for Patients:   StrictlyIdeas.no  Fact Sheet for Healthcare Providers: BankingDealers.co.za  This test is not yet approved or  cleared by the [place] FDA and has been authorized for detection and/or diagnosis of SARS-CoV-2 by FDA under an Emergency Use Authorization (EUA).  This EUA will remain in effect (meaning this test can be used) for the duration of the COVID-19 declaration under Section 564(b)(1) of the Act, 21 U.S.C. section 360bbb-3(b)(1), unless the authorization is terminated or revoked sooner.  Performed at [facility], [address] [place], [place] [address]      Radiology Studies: DG Chest Portable 1 View  Result Date: 06/15/2020 CLINICAL DATA:  Cough and congestion for 3 days EXAM: PORTABLE CHEST 1 VIEW COMPARISON:  Radiograph 01/17/2020 FINDINGS: Chronically coarsened interstitial changes with some relative apical lucency which may emphysema, similar to  comparison. No focal consolidation or convincing features of edema. Pulmonary vascularity remains normally distributed. No pneumothorax or visible effusion. Prominent cardiac silhouette is likely accentuated by the portable technique. No acute osseous or soft tissue abnormality. Degenerative changes are present in the imaged spine and shoulders. Nasal cannula overlies the chest. IMPRESSION: No acute cardiopulmonary abnormality. Chronically coarsened interstitial changes with hyperinflation and apical lucency suggesting emphysematous change and compatible with patient's known COPD. Electronically Signed   By: Lovena Le M.D.   On: 06/15/2020 19:48   Scheduled Meds: . [START ON 06/17/2020] azithromycin  500 mg Oral Q24H  . enoxaparin (LOVENOX) injection  40 mg Subcutaneous Q24H  . furosemide  20 mg Oral Daily  . guaiFENesin  600 mg Oral BID  . ipratropium-albuterol  3 mL Nebulization Q6H  . methylPREDNISolone (SOLU-MEDROL) injection  60 mg Intravenous Q12H   Continuous Infusions: . azithromycin  LOS: 1 day   Time spent: 25 mins  Marleny Faller Wynetta Emery, MD How to contact the [facility] Attending or Consulting provider [place] or covering provider during after hours [place], for this patient?  1. Check the care team in [facility] and look for a) attending/consulting TR[facility] provider listed and b) the [facility] team listed 2. Log into [email or web address] and use [place]'s universal password to access. If you do not have the password, please contact the hospital operator. 3. Locate the [facility] provider you are looking for under [facility] and page to a number that you can be directly reached. 4. If you still have difficulty reaching the provider, please page the [facility] (Director on Call) for the Hospitalists listed on amion for assistance.  06/16/2020, 3:23 PM

## 2020-06-17 MED ORDER — OMEPRAZOLE 20 MG PO CPDR: 20.0000 mg | DELAYED_RELEASE_CAPSULE | Freq: Two times a day (BID) | ORAL | 0 refills | Status: DC

## 2020-06-17 MED ORDER — GUAIFENESIN ER 600 MG PO TB12: 1200.0000 mg | ORAL_TABLET | Freq: Two times a day (BID) | ORAL | 0 refills | Status: AC

## 2020-06-17 MED ORDER — IPRATROPIUM-ALBUTEROL 0.5-2.5 (3) MG/3ML IN SOLN: 3.0000 mL | Freq: Three times a day (TID) | RESPIRATORY_TRACT | 0 refills | Status: DC

## 2020-06-17 MED ORDER — HYDROCOD POLST-CPM POLST ER 10-8 MG/5ML PO SUER: 5.0000 mL | Freq: Two times a day (BID) | ORAL | 0 refills | Status: DC | PRN

## 2020-06-17 MED ORDER — PREDNISONE 20 MG PO TABS
ORAL_TABLET | ORAL | 0 refills | Status: DC
Start: 2020-06-17 — End: 2020-09-08

## 2020-06-17 MED ORDER — DOXYCYCLINE HYCLATE 100 MG PO CAPS: 100.0000 mg | ORAL_CAPSULE | Freq: Two times a day (BID) | ORAL | 0 refills | Status: AC

## 2020-06-17 NOTE — Evaluation (Signed)
Physical Therapy Evaluation Patient Details Name: Emily Fitzgerald MRN: [id number] DOB: Oct 08, 1961 Today's Date: 06/17/2020   History of Present Illness  Emily Fitzgerald  is a 59 y.o. female, with history of COPD presents to the ED with a chief complaint of cough, rhinorrhea, and shortness of breath.  Patient reports his symptoms started suddenly 2 days ago.  She reports that she has been through this and of times that she knew that she had to come into the ED.  She has had her flu shot this year she reports.  She has not had a Covid vaccine and refuses to get one.  Patient reports that her cough is productive of clear sputum.  She is a former smoker and does have a morning cough.  But this cough is different than that cough because it is lasting all day and it is more intense.  Patient reports that she has had congestion.  She has an associated headache-in the same location in the same intensity as her normal sinus congestion headache.  Patient reports subjective fever describing that she woke up diaphoretic and chilled in the night.  Her dyspnea is worse with walking.  She does not notice any worsening of the symptoms while talking.  She does have as needed oxygen at home.  She was wearing that today at 3 L.  She is used her rescue inhaler today as well.  She continued to have shortness of breath.  She has not used her nebulizer today.  She reports decreased appetite.  She did not eat breakfast or lunch today.  She is hungry now.  Patient also has urinary frequency.  She reports no dysuria.  The urinary frequency started 1 month ago.  She is on Lasix 20 mg.    Clinical Impression  Patient functioning near baseline for functional mobility and gait but remains limited by impaired activity tolerance and fatigue. Patient does not require assist for bed mobility and demonstrates good sitting tolerance EOB today. She also shows good sitting balance. Upon entrance for session, patient was on 5L O2 via nasal canula  despite stating she is only on 3L at home PRN. On 5L O2, O2 sat at 94%. Patient O2 sat on room air was 90% while ambulating about 120 feet but upon finishing ambulating her O2 sat decreased to 78-82% while seated EOB. O2 sat increased to 85% on room air while seated EOB with instruction for pursed lip breathing. Left patient at end of session with respiratory therapist while seated EOB. Patient limited for functional mobility as stated below secondary to BLE weakness, fatigue and poor standing balance.  Patient will benefit from continued physical therapy in hospital and recommended venue below to increase strength, balance, endurance for safe ADLs and gait.      Follow Up Recommendations No PT follow up;Supervision - Intermittent    Equipment Recommendations  None recommended by PT    Recommendations for Other Services       Precautions / Restrictions Precautions Precautions: None Restrictions Weight Bearing Restrictions: No      Mobility  Bed Mobility Overal bed mobility: Modified Independent                Transfers Overall transfer level: Modified independent Equipment used: None                Ambulation/Gait Ambulation/Gait assistance: Min guard Gait Distance (Feet): 120 Feet Assistive device: None Gait Pattern/deviations: Step-through pattern Gait velocity: decreased   General Gait Details: min guard for  safety and balance, limited by fatigue, no loss of balance; O2 sat at 90% on room air while ambulating, which dropped to 78-82% immediatly following ambulation which increased to 85% with instruction for pursed lip breathing  Stairs            Wheelchair Mobility    Modified Rankin (Stroke Patients Only)       Balance Overall balance assessment: Needs assistance   Sitting balance-Leahy Scale: Normal Sitting balance - Comments: seated EOB   Standing balance support: No upper extremity supported Standing balance-Leahy Scale: Good Standing  balance comment: without AD                             Pertinent Vitals/Pain Pain Assessment: No/denies pain    Home Living Family/patient expects to be discharged to:: Private residence Living Arrangements: Spouse/significant other;Non-relatives/Friends Available Help at Discharge: Friend(s);Available 24 hours/day Type of Home: House       Home Layout: Two level Home Equipment: None      Prior Function Level of Independence: Independent         Comments: Patient states independent with ADL, community ambulator if she takes her time, cooks     Journalist, newspaper        Extremity/Trunk Assessment   Upper Extremity Assessment Upper Extremity Assessment: Overall WFL for tasks assessed    Lower Extremity Assessment Lower Extremity Assessment: Overall WFL for tasks assessed    Cervical / Trunk Assessment Cervical / Trunk Assessment: Normal  Communication   Communication: No difficulties  Cognition Arousal/Alertness: Awake/alert Behavior During Therapy: WFL for tasks assessed/performed Overall Cognitive Status: Within Functional Limits for tasks assessed                                        General Comments      Exercises     Assessment/Plan    PT Assessment Patient needs continued PT services  PT Problem List Decreased strength;Decreased activity tolerance;Decreased balance;Decreased mobility;Cardiopulmonary status limiting activity       PT Treatment Interventions DME instruction;Balance training;Gait training;Neuromuscular re-education;Stair training;Functional mobility training;Patient/family education;Therapeutic activities;Therapeutic exercise    PT Goals (Current goals can be found in the Care Plan section)  Acute Rehab PT Goals Patient Stated Goal: Return home PT Goal Formulation: With patient Time For Goal Achievement: 06/24/20 Potential to Achieve Goals: Good    Frequency Min 3X/week   Barriers to discharge         Co-evaluation               AM-PAC PT "6 Clicks" Mobility  Outcome Measure Help needed turning from your back to your side while in a flat bed without using bedrails?: None Help needed moving from lying on your back to sitting on the side of a flat bed without using bedrails?: None Help needed moving to and from a bed to a chair (including a wheelchair)?: None Help needed standing up from a chair using your arms (e.g., wheelchair or bedside chair)?: None Help needed to walk in hospital room?: A Little Help needed climbing 3-5 steps with a railing? : A Little 6 Click Score: 22    End of Session Equipment Utilized During Treatment: Oxygen;Gait belt Activity Tolerance: Patient tolerated treatment well;Patient limited by fatigue Patient left: in bed;with call bell/phone within reach;Other (comment) (with respiratory therapist) Nurse Communication: Mobility status PT  Visit Diagnosis: Unsteadiness on feet (R26.81);Other abnormalities of gait and mobility (R26.89);Muscle weakness (generalized) (M62.81)    Time: 4944-7395 PT Time Calculation (min) (ACUTE ONLY): 25 min   Charges:   PT Evaluation $PT Eval Moderate Complexity: 1 Mod PT Treatments $Therapeutic Activity: 8-22 mins        10:34 AM, 06/17/20 Mearl Latin PT, DPT Physical Therapist at [facility]

## 2020-06-17 NOTE — Progress Notes (Signed)
Nsg Discharge Note  Admit Date:  06/15/2020 Discharge date: 06/17/2020   Juliann Pulse to be D/C'd home per MD order.  AVS completed.  Copy for chart, and copy for patient signed, and dated. Patient/caregiver able to verbalize understanding. Discharge Medication: Allergies as of 06/17/2020   No Known Allergies     Medication List    TAKE these medications   acetaminophen 500 MG tablet Commonly known as: TYLENOL Take 500 mg by mouth every 6 (six) hours as needed for mild pain or moderate pain.   albuterol 108 (90 Base) MCG/ACT inhaler Commonly known as: VENTOLIN HFA Inhale 1-2 puffs into the lungs every 6 (six) hours as needed for wheezing or shortness of breath.   chlorpheniramine-HYDROcodone 10-8 MG/5ML Suer Commonly known as: TUSSIONEX Take 5 mLs by mouth every 12 (twelve) hours as needed for cough.   cholecalciferol 25 MCG (1000 UNIT) tablet Commonly known as: VITAMIN D3 Take 1,000 Units by mouth daily.   doxycycline 100 MG capsule Commonly known as: VIBRAMYCIN Take 1 capsule (100 mg total) by mouth 2 (two) times daily for 3 days.   gabapentin 100 MG capsule Commonly known as: NEURONTIN Take 100 mg by mouth 3 (three) times daily as needed (for pain).   guaiFENesin 600 MG 12 hr tablet Commonly known as: MUCINEX Take 2 tablets (1,200 mg total) by mouth 2 (two) times daily for 5 days.   ipratropium-albuterol 0.5-2.5 (3) MG/3ML Soln Commonly known as: DUONEB Take 3 mLs by nebulization in the morning, at noon, and at bedtime. What changed:   when to take this  reasons to take this   multivitamin with minerals Tabs tablet Take 1 tablet by mouth daily.   omeprazole 20 MG capsule Commonly known as: PriLOSEC Take 1 capsule (20 mg total) by mouth 2 (two) times daily for 14 days.   OXYGEN Inhale 3 L into the lungs daily as needed (FOR SHORTNESS OF BREATH).   predniSONE 20 MG tablet Commonly known as: DELTASONE Take 3 PO QAM x3days, 2 PO QAM x3days, 1 PO QAM x3days        Discharge Assessment: Vitals:   06/17/20 0553 06/17/20 0948  BP: (!) 136/77   Pulse: 68   Resp: 20   Temp: 98.7 F (37.1 C)   SpO2: 96% 90%   Skin clean, dry and intact without evidence of skin break down, no evidence of skin tears noted. IV catheter discontinued intact. Site without signs and symptoms of complications - no redness or edema noted at insertion site, patient denies c/o pain - only slight tenderness at site.  Dressing with slight pressure applied.  D/c Instructions-Education: Discharge instructions given to patient/family with verbalized understanding. D/c education completed with patient/family including follow up instructions, medication list, d/c activities limitations if indicated, with other d/c instructions as indicated by MD - patient able to verbalize understanding, all questions fully answered. Patient instructed to return to ED, call 911, or call MD for any changes in condition.  Patient escorted via [place], and D/C home via private auto.  Felicie Morn, RN 06/17/2020 3:55 PM

## 2020-06-17 NOTE — Plan of Care (Signed)
  Problem: Acute Rehab PT Goals(only PT should resolve) Goal: Pt Will Transfer Bed To Chair/Chair To Bed Outcome: Progressing Flowsheets (Taken 06/17/2020 1036) Pt will Transfer Bed to Chair/Chair to Bed: Independently Goal: Pt Will Ambulate Outcome: Progressing Flowsheets (Taken 06/17/2020 1036) Pt will Ambulate:  > 125 feet  with modified independence Goal: Pt/caregiver will Perform Home Exercise Program Outcome: Progressing Flowsheets (Taken 06/17/2020 1036) Pt/caregiver will Perform Home Exercise Program:  For increased strengthening  For improved balance  Independently  10:36 AM, 06/17/20 Mearl Latin PT, DPT Physical Therapist at [facility]

## 2020-06-17 NOTE — Discharge Summary (Signed)
Physician Discharge Summary  Emily Fitzgerald DTO:[id number] DOB: Apr 03, 1961 DOA: 06/15/2020  PCP: Gwenlyn Saran Elizabeth date: 06/15/2020 Discharge date: 06/17/2020  Admitted From:  HOME  Disposition:  HOME   Recommendations for Outpatient Follow-up:  1. Follow up with PCP in 1-2 weeks  Discharge Condition: STABLE   CODE STATUS: FULL    Brief Hospitalization Summary: Please see all hospital notes, images, labs for full details of the hospitalization. ADMISSION HPI:  Emily Fitzgerald  is a 59 y.o. female, with history of COPD presents to the ED with a chief complaint of cough, rhinorrhea, and shortness of breath.  Patient reports his symptoms started suddenly 2 days ago.  She reports that she has been through this and of times that she knew that she had to come into the ED.  She has had her flu shot this year she reports.  She has not had a Covid vaccine and refuses to get one.  Patient reports that her cough is productive of clear sputum.  She is a former smoker and does have a morning cough.  But this cough is different than that cough because it is lasting all day and it is more intense.  Patient reports that she has had congestion.  She has an associated headache-in the same location in the same intensity as her normal sinus congestion headache.  Patient reports subjective fever describing that she woke up diaphoretic and chilled in the night.  Her dyspnea is worse with walking.  She does not notice any worsening of the symptoms while talking.  She does have as needed oxygen at home.  She was wearing that today at 3 L.  She is used her rescue inhaler today as well.  She continued to have shortness of breath.  She has not used her nebulizer today.  She reports decreased appetite.  She did not eat breakfast or lunch today.  She is hungry now.  Patient also has urinary frequency.  She reports no dysuria.  The urinary frequency started 1 month ago.  She is on Lasix 20 mg.  ED  course Temp 88.8, blood pressure 136/83, 94% on 5 L nasal cannula, heart rate 88, respiratory rate 18. No leukocytosis no anemia CHEM panel shows hypokalemia at 3.4 Elevated bicarbonate 33 Covid negative EKG shows sinus rhythm heart rate 88, QTc 463  Chest x-ray no acute cardiopulmonary abnormality  Brief Admission History:  59 y.o.female,with history of COPD presents to the ED with a chief complaint of cough,rhinorrhea, and shortness of breath. Patient reports his symptoms started suddenly 2 days ago. She reports that she has been through this and of times that she knew that she had to come into the ED. She has had her flu shot this year she reports. She has not had a Covid vaccine and refuses to get one. Patient reports that her cough is productive of clear sputum. She is a former smoker and does have a morning cough.  Assessment & Plan:   Principal Problem:   Acute respiratory failure with hypoxia  Active Problems:   COPD with acute exacerbation  1. Acute on chronic respiratory failure with hypoxia - Pt reports that she is breathing much better today after treatments and  speaking in normal sentences and coughing less, admission secondary to COPD exacerbation which is being treated.   2. CoPD with acute exacerbation -Pt was treated with IV steroids, nebulizer treatments, and antibiotics as ordered.   DC home on prednisone taper, doxycycline, resume home oxygen  and duoneb treatments.  TOC consulted to help patient obtain home supplies for tubing and masks.  3. Hypokalemia - repleted.   DVT prophylaxis:  Enoxaparin  Code Status: full  Family Communication:  Disposition:   Status is: Inpatient  Remains inpatient appropriate because:Inpatient level of care appropriate due to severity of illness  Dispo: The patient is from: Home  Anticipated d/c is to: Home  Anticipated d/c date is: 06/17/2020  Patient currently is medically stable  to d/c.  Discharge Diagnoses:  Principal Problem:   Acute respiratory failure with hypoxia ([place]) Active Problems:   COPD with acute exacerbation [facility])   Discharge Instructions:  Allergies as of 06/17/2020   No Known Allergies     Medication List    TAKE these medications   acetaminophen 500 MG tablet Commonly known as: TYLENOL Take 500 mg by mouth every 6 (six) hours as needed for mild pain or moderate pain.   albuterol 108 (90 Base) MCG/ACT inhaler Commonly known as: VENTOLIN HFA Inhale 1-2 puffs into the lungs every 6 (six) hours as needed for wheezing or shortness of breath.   chlorpheniramine-HYDROcodone 10-8 MG/5ML Suer Commonly known as: TUSSIONEX Take 5 mLs by mouth every 12 (twelve) hours as needed for cough.   cholecalciferol 25 MCG (1000 UNIT) tablet Commonly known as: VITAMIN D3 Take 1,000 Units by mouth daily.   doxycycline 100 MG capsule Commonly known as: VIBRAMYCIN Take 1 capsule (100 mg total) by mouth 2 (two) times daily for 3 days.   gabapentin 100 MG capsule Commonly known as: NEURONTIN Take 100 mg by mouth 3 (three) times daily as needed (for pain).   guaiFENesin 600 MG 12 hr tablet Commonly known as: MUCINEX Take 2 tablets (1,200 mg total) by mouth 2 (two) times daily for 5 days.   ipratropium-albuterol 0.5-2.5 (3) MG/3ML Soln Commonly known as: DUONEB Take 3 mLs by nebulization in the morning, at noon, and at bedtime. What changed:   when to take this  reasons to take this   multivitamin with minerals Tabs tablet Take 1 tablet by mouth daily.   omeprazole 20 MG capsule Commonly known as: PriLOSEC Take 1 capsule (20 mg total) by mouth 2 (two) times daily for 14 days.   OXYGEN Inhale 3 L into the lungs daily as needed (FOR SHORTNESS OF BREATH).   predniSONE 20 MG tablet Commonly known as: DELTASONE Take 3 PO QAM x3days, 2 PO QAM x3days, 1 PO QAM [place], [facility].  Schedule an appointment as soon as possible for a visit in 1 week(s).   Contact information: [place] [address] [phone]              No Known Allergies Allergies as of 06/17/2020   No Known Allergies     Medication List    TAKE these medications   acetaminophen 500 MG tablet Commonly known as: TYLENOL Take 500 mg by mouth every 6 (six) hours as needed for mild pain or moderate pain.   albuterol 108 (90 Base) MCG/ACT inhaler Commonly known as: VENTOLIN HFA Inhale 1-2 puffs into the lungs every 6 (six) hours as needed for wheezing or shortness of breath.   chlorpheniramine-HYDROcodone 10-8 MG/5ML Suer Commonly known as: TUSSIONEX Take 5 mLs by mouth every 12 (twelve) hours as needed for cough.   cholecalciferol 25 MCG (1000 UNIT) tablet Commonly known as: VITAMIN D3 Take 1,000 Units by mouth daily.  doxycycline 100 MG capsule Commonly known as: VIBRAMYCIN Take 1 capsule (100 mg total) by mouth 2 (two) times daily for 3 days.   gabapentin 100 MG capsule Commonly known as: NEURONTIN Take 100 mg by mouth 3 (three) times daily as needed (for pain).   guaiFENesin 600 MG 12 hr tablet Commonly known as: MUCINEX Take 2 tablets (1,200 mg total) by mouth 2 (two) times daily for 5 days.   ipratropium-albuterol 0.5-2.5 (3) MG/3ML Soln Commonly known as: DUONEB Take 3 mLs by nebulization in the morning, at noon, and at bedtime. What changed:   when to take this  reasons to take this   multivitamin with minerals Tabs tablet Take 1 tablet by mouth daily.   omeprazole 20 MG capsule Commonly known as: PriLOSEC Take 1 capsule (20 mg total) by mouth 2 (two) times daily for 14 days.   OXYGEN Inhale 3 L into the lungs daily as needed (FOR SHORTNESS OF BREATH).   predniSONE 20 MG tablet Commonly known as: DELTASONE Take 3 PO QAM x3days, 2 PO QAM x3days, 1 PO QAM x3days       Procedures/Studies: DG Chest Portable 1 View  Result Date:  06/15/2020 CLINICAL DATA:  Cough and congestion for 3 days EXAM: PORTABLE CHEST 1 VIEW COMPARISON:  Radiograph 01/17/2020 FINDINGS: Chronically coarsened interstitial changes with some relative apical lucency which may emphysema, similar to comparison. No focal consolidation or convincing features of edema. Pulmonary vascularity remains normally distributed. No pneumothorax or visible effusion. Prominent cardiac silhouette is likely accentuated by the portable technique. No acute osseous or soft tissue abnormality. Degenerative changes are present in the imaged spine and shoulders. Nasal cannula overlies the chest. IMPRESSION: No acute cardiopulmonary abnormality. Chronically coarsened interstitial changes with hyperinflation and apical lucency suggesting emphysematous change and compatible with patient's known COPD. Electronically Signed   By: Lovena Le M.D.   On: 06/15/2020 19:48      Subjective: Pt breathing and speaking a lot better today and she was able to ambulate with PT.    Discharge Exam: Vitals:   06/17/20 0553 06/17/20 0948  BP: (!) 136/77   Pulse: 68   Resp: 20   Temp: 98.7 F (37.1 C)   SpO2: 96% 90%   Vitals:   06/16/20 2114 06/17/20 0153 06/17/20 0553 06/17/20 0948  BP: (!) 149/86  (!) 136/77   Pulse: 88  68   Resp: 20  20   Temp: 98.2 F (36.8 C)  98.7 F (37.1 C)   TempSrc: Oral  Oral   SpO2: 95% 93% 96% 90%  Weight:      Height:       General: Pt is alert, awake, not in acute distress Cardiovascular: normal S1/S2 +, no rubs, no gallops Respiratory: much improved breathing with rare wheezing, no rhonchi Abdominal: Soft, NT, ND, bowel sounds + Extremities: no edema, no cyanosis   The results of significant diagnostics from this hospitalization (including imaging, microbiology, ancillary and laboratory) are listed below for reference.     Microbiology: Recent Results (from the past 240 hour(s))  SARS Coronavirus 2 by RT PCR (hospital order, performed in  [facility] hospital lab) Nasopharyngeal Nasopharyngeal Swab     Status: None   Collection Time: 06/15/20  7:50 PM   Specimen: Nasopharyngeal Swab  Result Value Ref Range Status   SARS Coronavirus 2 NEGATIVE NEGATIVE Final    Comment: (NOTE) SARS-CoV-2 target nucleic acids are NOT DETECTED.  The SARS-CoV-2 RNA is generally detectable in upper and lower  respiratory specimens during the acute phase of infection. The lowest concentration of SARS-CoV-2 viral copies this assay can detect is 250 copies / mL. A negative result does not preclude SARS-CoV-2 infection and should not be used as the sole basis for treatment or other patient management decisions.  A negative result may occur with improper specimen collection / handling, submission of specimen other than nasopharyngeal swab, presence of viral mutation(s) within the areas targeted by this assay, and inadequate number of viral copies (<250 copies / mL). A negative result must be combined with clinical observations, patient history, and epidemiological information.  Fact Sheet for Patients:   StrictlyIdeas.no  Fact Sheet for Healthcare Providers: BankingDealers.co.za  This test is not yet approved or  cleared by the [place] FDA and has been authorized for detection and/or diagnosis of SARS-CoV-2 by FDA under an Emergency Use Authorization (EUA).  This EUA will remain in effect (meaning this test can be used) for the duration of the COVID-19 declaration under Section 564(b)(1) of the Act, 21 U.S.C. section 360bbb-3(b)(1), unless the authorization is terminated or revoked sooner.  Performed at [facility], [address], [place], [place] [address]     Labs: BNP (last 3 results) Recent Labs    01/17/20 2130  BNP 3,244.0*   Basic Metabolic Panel: Recent Labs  Lab 06/15/20 1954 06/16/20 0444  NA 136 136  K 3.4* 3.7  CL 93* 94*  CO2 33* 31  GLUCOSE 99 175*  BUN 6 9   CREATININE 0.62 0.89  CALCIUM 9.0 9.0  MG  --  1.9   Liver Function Tests: Recent Labs  Lab 06/16/20 0444  AST 15  ALT 12  ALKPHOS 72  BILITOT 0.3  PROT 6.6  ALBUMIN 3.2*   No results for input(s): LIPASE, AMYLASE in the last 168 hours. No results for input(s): AMMONIA in the last 168 hours. CBC: Recent Labs  Lab 06/15/20 1954 06/16/20 0444  WBC 6.9 4.5  NEUTROABS 3.9 2.9  HGB 14.8 14.5  HCT 45.8 45.4  MCV 99.6 100.2*  PLT 242 229   Cardiac Enzymes: No results for input(s): CKTOTAL, CKMB, CKMBINDEX, TROPONINI in the last 168 hours. BNP: Invalid input(s): POCBNP CBG: No results for input(s): GLUCAP in the last 168 hours. D-Dimer No results for input(s): DDIMER in the last 72 hours. Hgb A1c No results for input(s): HGBA1C in the last 72 hours. Lipid Profile No results for input(s): CHOL, HDL, LDLCALC, TRIG, CHOLHDL, LDLDIRECT in the last 72 hours. Thyroid function studies No results for input(s): TSH, T4TOTAL, T3FREE, THYROIDAB in the last 72 hours.  Invalid input(s): FREET3 Anemia work up No results for input(s): VITAMINB12, FOLATE, FERRITIN, TIBC, IRON, RETICCTPCT in the last 72 hours. Urinalysis    Component Value Date/Time   COLORURINE YELLOW 06/16/2020 0532   APPEARANCEUR CLEAR 06/16/2020 0532   LABSPEC 1.006 06/16/2020 0532   PHURINE 6.0 06/16/2020 0532   GLUCOSEU >=500 (A) 06/16/2020 0532   HGBUR SMALL (A) 06/16/2020 0532   BILIRUBINUR NEGATIVE 06/16/2020 0532   KETONESUR NEGATIVE 06/16/2020 0532   PROTEINUR NEGATIVE 06/16/2020 0532   NITRITE NEGATIVE 06/16/2020 0532   LEUKOCYTESUR NEGATIVE 06/16/2020 0532   Sepsis Labs Invalid input(s): PROCALCITONIN,  WBC,  LACTICIDVEN Microbiology Recent Results (from the past 240 hour(s))  SARS Coronavirus 2 by RT PCR (hospital order, performed in [place] hospital lab) Nasopharyngeal Nasopharyngeal Swab     Status: None   Collection Time: 06/15/20  7:50 PM   Specimen: Nasopharyngeal Swab  Result  Value Ref Range Status   SARS Coronavirus 2 NEGATIVE NEGATIVE Final    Comment: (NOTE) SARS-CoV-2 target nucleic acids are NOT DETECTED.  The SARS-CoV-2 RNA is generally detectable in upper and lower respiratory specimens during the acute phase of infection. The lowest concentration of SARS-CoV-2 viral copies this assay can detect is 250 copies / mL. A negative result does not preclude SARS-CoV-2 infection and should not be used as the sole basis for treatment or other patient management decisions.  A negative result may occur with improper specimen collection / handling, submission of specimen other than nasopharyngeal swab, presence of viral mutation(s) within the areas targeted by this assay, and inadequate number of viral copies (<250 copies / mL). A negative result must be combined with clinical observations, patient history, and epidemiological information.  Fact Sheet for Patients:   StrictlyIdeas.no  Fact Sheet for Healthcare Providers: BankingDealers.co.za  This test is not yet approved or  cleared by the [place] FDA and has been authorized for detection and/or diagnosis of SARS-CoV-2 by FDA under an Emergency Use Authorization (EUA).  This EUA will remain in effect (meaning this test can be used) for the duration of the COVID-19 declaration under Section 564(b)(1) of the Act, 21 U.S.C. section 360bbb-3(b)(1), unless the authorization is terminated or revoked sooner.  Performed at [facility], [address], [place], [place] [address]    Time coordinating discharge: 35 mins   SIGNED:  Irwin Brakeman, MD  [facility] 06/17/2020, 2:25 PM How to contact the [facility] Attending or Consulting provider [place] or covering provider during after hours [place], for this patient?  1. Check the care team in [facility] and look for a) attending/consulting [facility] provider listed and b) the [facility] team listed 2. Log into [email or web address] and  use [place]'s universal password to access. If you do not have the password, please contact the hospital operator. 3. Locate the [facility] provider you are looking for under [facility] and page to a number that you can be directly reached. 4. If you still have difficulty reaching the provider, please page the [facility] (Director on Call) for the Hospitalists listed on amion for assistance.

## 2020-06-17 NOTE — Discharge Instructions (Signed)
Chronic Obstructive Pulmonary Disease Exacerbation Chronic obstructive pulmonary disease (COPD) is a long-term (chronic) lung problem. In COPD, the flow of air from the lungs is limited. COPD exacerbations are times that breathing gets worse and you need more than your normal treatment. Without treatment, they can be life threatening. If they happen often, your lungs can become more damaged. If your COPD gets worse, your doctor may treat you with:  Medicines.  Oxygen.  Different ways to clear your airway, such as using a mask. Follow these instructions at home: Medicines  Take over-the-counter and prescription medicines only as told by your doctor.  If you take an antibiotic or steroid medicine, do not stop taking the medicine even if you start to feel better.  Keep up with shots (vaccinations) as told by your doctor. Be sure to get a yearly (annual) flu shot. Lifestyle  Do not smoke. If you need help quitting, ask your doctor.  Eat healthy foods.  Exercise regularly.  Get plenty of sleep.  Avoid tobacco smoke and other things that can bother your lungs.  Wash your hands often with soap and water. This will help keep you from getting an infection. If you cannot use soap and water, use hand sanitizer.  During flu season, avoid areas that are crowded with people. General instructions  Drink enough fluid to keep your pee (urine) clear or pale yellow. Do not do this if your doctor has told you not to.  Use a cool mist machine (vaporizer).  If you use oxygen or a machine that turns medicine into a mist (nebulizer), continue to use it as told.  Follow all instructions for rehabilitation. These are steps you can take to make your body work better.  Keep all follow-up visits as told by your doctor. This is important. Contact a doctor if:  Your COPD symptoms get worse than normal. Get help right away if:  You are short of breath and it gets worse.  You have trouble  talking.  You have chest pain.  You cough up blood.  You have a fever.  You keep throwing up (vomiting).  You feel weak or you pass out (faint).  You feel confused.  You are not able to sleep because of your symptoms.  You are not able to do daily activities. Summary  COPD exacerbations are times that breathing gets worse and you need more treatment than normal.  COPD exacerbations can be very serious and may cause your lungs to become more damaged.  Do not smoke. If you need help quitting, ask your doctor.  Stay up-to-date on your shots. Get a flu shot every year. This information is not intended to replace advice given to you by your health care provider. Make sure you discuss any questions you have with your health care provider. Document Revised: 10/25/2017 Document Reviewed: 12/17/2016 Elsevier Patient Education  [place].      IMPORTANT INFORMATION: PAY CLOSE ATTENTION   PHYSICIAN DISCHARGE INSTRUCTIONS  Follow with Primary care provider  [facility]  and other consultants as instructed by your [place] IF SYMPTOMS COME BACK, WORSEN OR NEW PROBLEM DEVELOPS   Please note: You were cared for by a hospitalist during your hospital stay. Every effort will be made to forward records to your primary care provider.  You can request that your primary care provider send for your hospital records if they have not received them.  Once you  are discharged, your primary care physician will handle any further medical issues. Please note that NO REFILLS for any discharge medications will be authorized once you are discharged, as it is imperative that you return to your primary care physician (or establish a relationship with a primary care physician if you do not have one) for your post hospital discharge needs so that they can reassess your need for medications and monitor your lab  values.  Please get a complete blood count and chemistry panel checked by your Primary MD at your next visit, and again as instructed by your Primary MD.  Get Medicines reviewed and adjusted: Please take all your medications with you for your next visit with your Primary MD  Laboratory/radiological data: Please request your Primary MD to go over all hospital tests and procedure/radiological results at the follow up, please ask your primary care provider to get all Hospital records sent to his/her office.  In some cases, they will be blood work, cultures and biopsy results pending at the time of your discharge. Please request that your primary care provider follow up on these results.  If you are diabetic, please bring your blood sugar readings with you to your follow up appointment with primary care.    Please call and make your follow up appointments as soon as possible.    Also Note the following: If you experience worsening of your admission symptoms, develop shortness of breath, life threatening emergency, suicidal or homicidal thoughts you must seek medical attention immediately by calling 911 or calling your MD immediately  if symptoms less severe.  You must read complete instructions/literature along with all the possible adverse reactions/side effects for all the Medicines you take and that have been prescribed to you. Take any new Medicines after you have completely understood and accpet all the possible adverse reactions/side effects.   Do not drive when taking Pain medications or sleeping medications (Benzodiazepines)  Do not take more than prescribed Pain, Sleep and Anxiety Medications. It is not advisable to combine anxiety,sleep and pain medications without talking with your primary care practitioner  Special Instructions: If you have smoked or chewed Tobacco  in the last 2 yrs please stop smoking, stop any regular Alcohol  and or any Recreational drug use.  Wear Seat belts  while driving.  Do not drive if taking any narcotic, mind altering or controlled substances or recreational drugs or alcohol.

## 2020-06-17 NOTE — Clinical Social Work Note (Addendum)
Review of record indicates that patient's oxygen is through [employer]. Patient states that she will call [employer] about additional tubing. Hospital is sending patient home with tubing.  TOC Signing off.    Ambrose Pancoast D

## 2020-09-08 ENCOUNTER — Ambulatory Visit (INDEPENDENT_AMBULATORY_CARE_PROVIDER_SITE_OTHER): Payer: Medicaid Other | Admitting: Pulmonary Disease

## 2020-09-08 ENCOUNTER — Other Ambulatory Visit: Payer: Self-pay

## 2020-09-08 ENCOUNTER — Encounter: Payer: Self-pay | Admitting: Pulmonary Disease

## 2020-09-08 VITALS — BP 128/86 | HR 100 | Temp 97.2°F | Ht 66.0 in | Wt 200.4 lb

## 2020-09-08 DIAGNOSIS — I272 Pulmonary hypertension, unspecified: Secondary | ICD-10-CM

## 2020-09-08 DIAGNOSIS — J449 Chronic obstructive pulmonary disease, unspecified: Secondary | ICD-10-CM

## 2020-09-08 NOTE — Patient Instructions (Signed)
Will schedule lab tests, chest xray, pulmonary function test, 6 minute walk test, overnight oximetry and echocardiogram  Follow up in 4 weeks

## 2020-09-08 NOTE — Progress Notes (Signed)
[facility]  Chief Complaint  Patient presents with  . Consult    No complaints currently, needs surgical clearance    Constitutional:  BP 128/86 (BP Location: Left Arm, Cuff Size: Normal)   Pulse 100   Temp (!) 97.2 F (36.2 C) (Other (Comment)) Comment (Src): wrist  Ht 5\' 6"  (1.676 m)   Wt 200 lb 6.4 oz (90.9 kg)   SpO2 90% Comment: Room air  BMI 32.35 kg/m   Past Medical History:  Polycythemia, Allergies, Vit D deficiency, Influenza B February 2019  Past Surgical History:  Her  has a past surgical history that includes Cesarean section.  Brief Summary:  Jaquitta Dupriest is a 59 y.o. female smoker with COPD and chronic respiratory failure with hypoxia and hypercapnia.       Subjective:  She was seen by Dr. 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  She was advised that she would need lumpectomy.  She presents today for assessment of pulmonary status prior to surgery.    She started smoking at age 66.  She smoked up to 1 ppd and now down to 1/2 pack per day.  She qualified for disability based on COPD.  She thinks she saw a lung doctor about 3 or 4 years ago, but records from this assessment aren't available.  She has intermittent cough with clear sputum.  Doesn't have wheeze much.  She is okay with light house work.  She has to stop and rest when she is out at the store.  She has 3 liters oxygen at home, but uses when she feels like she needs it.  She has symbicort and uses at least once per day.  She isn't aware of any issues with her breathing while asleep.  She is from [place].  Worked in house keeping in hotels.  No animal/bird exposures.  She had pneumonia 2 years ago when she had the flu.  No history of TB.  No history of thromboembolic disease.  CXR from 06/15/20 showed changes of emphysema.  Physical Exam:   Appearance - well  kempt   ENMT - no sinus tenderness, no oral exudate, no LAN, Mallampati 2 airway, no stridor, poor dentition, raspy voice  Respiratory - decreased breath sounds bilaterally, no wheezing or rales  CV - s1s2 regular rate and rhythm, no murmurs  Ext - no clubbing, no edema  Skin - no rashes  Psych - normal mood and affect   Pulmonary testing:    Chest Imaging:    Sleep Tests:    Cardiac Tests:    Social History:  She  reports that she has been smoking cigarettes. She has a 37.00 pack-year smoking history. She has never used smokeless tobacco. She reports that she does not drink alcohol and does not use drugs.  Family History:  Her family history includes CAD in her brother; Cancer in her mother; Leukemia in her father.    Assessment/Plan:   COPD with emphysema. - will arrange for PFT and chest xray to assess current status - will arrange for alpha 1 antitrypsin testing - continue symbicort and prn duoneb for now   Chronic respiratory failure with hypoxia and hypercapnia. - will arrange for 6 minute walk test and overnight oximetry - will arrange for Echocardiogram to assess for pulmonary hypertension - continue 3 liters oxygen prn for now  Tobacco abuse. - reviewed options to  assist with smoking cessation - she will continue to try quitting on her own  Rt breast abnormalities. - she is being assessed by Dr. Lindalou Hose for lumpectomy - once above pulmonary testing completed, can then comment on her fitness for surgery  Secondary polycythemia. - followed by Dr. Silvestre Mesi at [facility]   Time Spent Involved in Patient Care on Day of Examination:  46 minutes  Follow up:  Patient Instructions  Will schedule lab tests, chest xray, pulmonary function test, 6 minute walk test, overnight oximetry and echocardiogram  Follow up in 4 weeks   Medication List:   Allergies as of 09/08/2020   No Known Allergies     Medication List       Accurate as of  September 08, 2020  9:50 AM. If you have any questions, ask your nurse or doctor.        STOP taking these medications   acetaminophen 500 MG tablet Commonly known as: TYLENOL Stopped by: Chesley Mires, MD   chlorpheniramine-HYDROcodone 10-8 MG/5ML Suer Commonly known as: TUSSIONEX Stopped by: Chesley Mires, MD   predniSONE 20 MG tablet Commonly known as: DELTASONE Stopped by: Chesley Mires, MD     TAKE these medications   albuterol 108 (90 Base) MCG/ACT inhaler Commonly known as: VENTOLIN HFA Inhale 1-2 puffs into the lungs every 6 (six) hours as needed for wheezing or shortness of breath.   cholecalciferol 25 MCG (1000 UNIT) tablet Commonly known as: VITAMIN D3 Take 1,000 Units by mouth daily.   gabapentin 100 MG capsule Commonly known as: NEURONTIN Take 100 mg by mouth 3 (three) times daily as needed (for pain).   ipratropium-albuterol 0.5-2.5 (3) MG/3ML Soln Commonly known as: DUONEB Take 3 mLs by nebulization in the morning, at noon, and at bedtime.   meloxicam 7.5 MG tablet Commonly known as: MOBIC Take 7.5 mg by mouth daily.   multivitamin with minerals Tabs tablet Take 1 tablet by mouth daily.   omeprazole 20 MG capsule Commonly known as: PriLOSEC Take 1 capsule (20 mg total) by mouth 2 (two) times daily for 14 days.   OXYGEN Inhale 3 L into the lungs daily as needed (FOR SHORTNESS OF BREATH).   Symbicort 80-4.5 MCG/ACT inhaler Generic drug: budesonide-formoterol Inhale into the lungs.       Signature:  Chesley Mires, MD [place] Pager - [phone] 09/08/2020, 9:50 AM

## 2020-09-09 ENCOUNTER — Other Ambulatory Visit: Payer: Self-pay

## 2020-09-09 ENCOUNTER — Ambulatory Visit (HOSPITAL_COMMUNITY)
Admission: RE | Admit: 2020-09-09 | Discharge: 2020-09-09 | Disposition: A | Discharge status: Home / Self Care | Payer: Medicaid Other | Source: Ambulatory Visit | Attending: Pulmonary Disease | Admitting: Pulmonary Disease

## 2020-09-09 ENCOUNTER — Other Ambulatory Visit (HOSPITAL_COMMUNITY)
Admission: RE | Admit: 2020-09-09 | Discharge: 2020-09-09 | Disposition: A | Discharge status: Home / Self Care | Payer: Medicaid Other | Source: Ambulatory Visit | Attending: Pulmonary Disease | Admitting: Pulmonary Disease

## 2020-09-09 ENCOUNTER — Telehealth: Payer: Self-pay | Admitting: Pulmonary Disease

## 2020-09-09 DIAGNOSIS — J449 Chronic obstructive pulmonary disease, unspecified: Secondary | ICD-10-CM | POA: Insufficient documentation

## 2020-09-09 DIAGNOSIS — I272 Pulmonary hypertension, unspecified: Secondary | ICD-10-CM | POA: Insufficient documentation

## 2020-09-09 LAB — CBC WITH DIFFERENTIAL/PLATELET
Abs Immature Granulocytes: 0.02 10*3/uL (ref 0.00–0.07)
Basophils Absolute: 0 10*3/uL (ref 0.0–0.1)
Basophils Relative: 0 %
Eosinophils Absolute: 0.2 10*3/uL (ref 0.0–0.5)
Eosinophils Relative: 2 %
HCT: 47.3 % — ABNORMAL HIGH (ref 36.0–46.0)
Hemoglobin: 15.3 g/dL — ABNORMAL HIGH (ref 12.0–15.0)
Immature Granulocytes: 0 %
Lymphocytes Relative: 52 %
Lymphs Abs: 4.2 10*3/uL — ABNORMAL HIGH (ref 0.7–4.0)
MCH: 32.2 pg (ref 26.0–34.0)
MCHC: 32.3 g/dL (ref 30.0–36.0)
MCV: 99.6 fL (ref 80.0–100.0)
Monocytes Absolute: 0.4 10*3/uL (ref 0.1–1.0)
Monocytes Relative: 5 %
Neutro Abs: 3.4 10*3/uL (ref 1.7–7.7)
Neutrophils Relative %: 41 %
Platelets: 235 10*3/uL (ref 150–400)
RBC: 4.75 MIL/uL (ref 3.87–5.11)
RDW: 13.2 % (ref 11.5–15.5)
WBC: 8.3 10*3/uL (ref 4.0–10.5)
nRBC: 0 % (ref 0.0–0.2)

## 2020-09-09 NOTE — Telephone Encounter (Signed)
Spoke with the pt   She is calling back to schedule her PFT and 6MW at [facility]  Will forward to [facility]

## 2020-09-12 LAB — ALPHA-1 ANTITRYPSIN PHENOTYPE: A-1 Antitrypsin, Ser: 125 mg/dL (ref 101–187)

## 2020-09-12 NOTE — Telephone Encounter (Signed)
Called [facility] & left vm letting them know pt is returning call to schedule pft/16mw at [facility].  Called pt & left her vm letting her know [facility] schedules those and that is who would have called her.  I gave her their # [phone].  Nothing further needed for this message.

## 2020-09-13 ENCOUNTER — Telehealth: Payer: Self-pay | Admitting: Pulmonary Disease

## 2020-09-13 ENCOUNTER — Ambulatory Visit (HOSPITAL_COMMUNITY): Payer: Medicaid Other

## 2020-09-13 NOTE — Telephone Encounter (Signed)
  DG Chest 2 View  Result Date: 09/11/2020 CLINICAL DATA:  59 year old female with chronic cough. EXAM: CHEST - 2 VIEW COMPARISON:  06/15/2020 and prior studies. FINDINGS: The cardiomediastinal silhouette is unremarkable. Minimal LEFT basilar scarring/atelectasis again noted. There is no evidence of focal airspace disease, pulmonary edema, suspicious pulmonary nodule/mass, pleural effusion, or pneumothorax. No acute bony abnormalities are identified. IMPRESSION: No active cardiopulmonary disease. Electronically Signed   By: Margarette Canada M.D.   On: 09/11/2020 14:35    CBC    Component Value Date/Time   WBC 8.3 09/09/2020 1252   RBC 4.75 09/09/2020 1252   HGB 15.3 (H) 09/09/2020 1252   HCT 47.3 (H) 09/09/2020 1252   PLT 235 09/09/2020 1252   MCV 99.6 09/09/2020 1252   MCH 32.2 09/09/2020 1252   MCHC 32.3 09/09/2020 1252   RDW 13.2 09/09/2020 1252   LYMPHSABS 4.2 (H) 09/09/2020 1252   MONOABS 0.4 09/09/2020 1252   EOSABS 0.2 09/09/2020 1252   BASOSABS 0.0 09/09/2020 1252    A1AT 09/09/20 >> 125, MM   Please let her know her chest xray shows changes of COPD.  Her lab tests show that she does not have the inherited form of emphysema.  Her hemoglobin is up slightly.  She should followed up with Dr. Federico Flake at [facility] in the hematology department to continue monitoring her hemoglobin level and determine whether she needs phlebotomy therapy.

## 2020-09-14 NOTE — Telephone Encounter (Signed)
Called and spoke with patient about xray and lab results per Dr Halford Chessman. All questions answered and patient expressed understanding of results and of Dr Juanetta Gosling recommendation to follow up with Dr Federico Flake at [facility] in Hematology department to continue monitoring patient hemaglobin level and determine whether patient needs phlebotomy therapy. Patient stated she the PFT is scheduled for tomorrow (09/15/20) and patient confirmed upcoming scheduled appointment with Dr Halford Chessman on 10/06/2020 @1045am . Nothing further needed at this time.

## 2020-09-15 ENCOUNTER — Other Ambulatory Visit: Payer: Self-pay

## 2020-09-15 ENCOUNTER — Ambulatory Visit (HOSPITAL_COMMUNITY)
Admission: RE | Admit: 2020-09-15 | Discharge: 2020-09-15 | Disposition: A | Discharge status: Home / Self Care | Payer: Medicaid Other | Source: Ambulatory Visit | Attending: Pulmonary Disease | Admitting: Pulmonary Disease

## 2020-09-15 DIAGNOSIS — I272 Pulmonary hypertension, unspecified: Secondary | ICD-10-CM | POA: Diagnosis not present

## 2020-09-15 DIAGNOSIS — J449 Chronic obstructive pulmonary disease, unspecified: Secondary | ICD-10-CM | POA: Diagnosis not present

## 2020-09-15 LAB — ECHOCARDIOGRAM COMPLETE
AR max vel: 2.29 cm2
AV Area VTI: 2.57 cm2
AV Area mean vel: 2.18 cm2
AV Mean grad: 4.9 mmHg
AV Peak grad: 9.3 mmHg
Ao pk vel: 1.52 m/s
Area-P 1/2: 3.58 cm2
S' Lateral: 3.27 cm

## 2020-09-15 NOTE — Progress Notes (Signed)
*  PRELIMINARY RESULTS* Echocardiogram 2D Echocardiogram has been performed.  Emily Fitzgerald 09/15/2020, 12:27 PM

## 2020-09-26 ENCOUNTER — Telehealth: Payer: Self-pay | Admitting: Pulmonary Disease

## 2020-09-26 NOTE — Telephone Encounter (Signed)
09/26/20  Insurance Call Representative: Mateo Flow ([place]) Peer to Peer: Echocardiogram Dr. Glory Rosebush   Peer to Peer scheduled -  09/26/20 at 1600   Will route to [facility] as FYI.   Wyn Quaker FNP

## 2020-09-26 NOTE — Telephone Encounter (Signed)
Called and left message on voicemail to please return phone call to go over echo results. Contact number provided.

## 2020-09-26 NOTE — Telephone Encounter (Signed)
Echo 09/15/20 >> EF 60 to 65%, mild LVH, grade 1 DD   Please let her know that her Echo shows mild heart stiffness related to low oxygen level causing increased workload on the heart.  She should continue using supplemental oxygen.  Will review in more detail at [facility] on 10/06/20.

## 2020-09-27 NOTE — Telephone Encounter (Signed)
Called and went over Echo results per Dr Halford Chessman. All questions answered and patient expressed full understanding of results and Dr Juanetta Gosling recommendation to continue to use supplemental oxygen. Confirmed with patient upcoming scheduled office visit for 10/06/2020. Nothing further needed at this time.

## 2020-10-03 ENCOUNTER — Ambulatory Visit (INDEPENDENT_AMBULATORY_CARE_PROVIDER_SITE_OTHER): Payer: Medicaid Other | Admitting: Primary Care

## 2020-10-03 ENCOUNTER — Telehealth: Payer: Self-pay

## 2020-10-03 ENCOUNTER — Other Ambulatory Visit: Payer: Self-pay

## 2020-10-03 DIAGNOSIS — R0602 Shortness of breath: Secondary | ICD-10-CM

## 2020-10-03 NOTE — Telephone Encounter (Signed)
Patient had previously called to let Dr Halford Chessman know that she had a PFT scheduled for 10/28/2020 and wanted to know if she should reschedule upcoming office visit this week with Dr Halford Chessman on 10/06/2020 at 1045 for after PFT. Writer discussed with Dr Halford Chessman who was ok with rescheduling appointment for after PFT. Called patient and rescheduled office visit for Thursday 11/03/2020 at 9:45am at the [place] office. Patient agreeable to time, date and location. Nothing further needed at this time.

## 2020-10-03 NOTE — Progress Notes (Signed)
We were not contacted for peer-to-peer for echocardiogram. She had an echocardiogram completed on 09/15/20 which showed EF 60 to 65%, mild LVH, grade 1 DD. Dr. Halford Chessman reviewed and recommended to continue use of supplemental oxygen. She has an apt with Dr. Halford Chessman on 11/03/20 for follow-up.

## 2020-10-04 NOTE — Telephone Encounter (Signed)
10/04/2020  Received call from insurance clinician.  They are reporting that they were attempting to complete the peer to peer.  Upon discussion it seems that the question was why the patient needed a 3D echocardiogram.  A 3D echocardiogram may have been improperly ordered.  Patient did complete an echocardiogram but it was a 2D echo on 09/15/2020.    Nothing further needed.  We will route to PCC's as FYI.  Wyn Quaker, FNP

## 2020-10-06 ENCOUNTER — Ambulatory Visit: Payer: Medicaid Other | Admitting: Pulmonary Disease

## 2020-10-09 ENCOUNTER — Encounter (HOSPITAL_COMMUNITY): Payer: Self-pay

## 2020-10-09 ENCOUNTER — Other Ambulatory Visit: Payer: Self-pay

## 2020-10-09 ENCOUNTER — Emergency Department (HOSPITAL_COMMUNITY): Payer: Medicaid Other

## 2020-10-09 ENCOUNTER — Emergency Department (HOSPITAL_COMMUNITY)
Admission: EM | Admit: 2020-10-09 | Discharge: 2020-10-09 | Disposition: A | Discharge status: Home / Self Care | Payer: Medicaid Other | Attending: Emergency Medicine | Admitting: Emergency Medicine

## 2020-10-09 DIAGNOSIS — Z7951 Long term (current) use of inhaled steroids: Secondary | ICD-10-CM | POA: Diagnosis not present

## 2020-10-09 DIAGNOSIS — R0789 Other chest pain: Secondary | ICD-10-CM | POA: Diagnosis present

## 2020-10-09 DIAGNOSIS — J441 Chronic obstructive pulmonary disease with (acute) exacerbation: Secondary | ICD-10-CM | POA: Diagnosis not present

## 2020-10-09 DIAGNOSIS — J40 Bronchitis, not specified as acute or chronic: Secondary | ICD-10-CM | POA: Diagnosis not present

## 2020-10-09 DIAGNOSIS — F17210 Nicotine dependence, cigarettes, uncomplicated: Secondary | ICD-10-CM | POA: Insufficient documentation

## 2020-10-09 DIAGNOSIS — Z720 Tobacco use: Secondary | ICD-10-CM

## 2020-10-09 LAB — BASIC METABOLIC PANEL
Anion gap: 9 (ref 5–15)
BUN: 8 mg/dL (ref 6–20)
CO2: 30 mmol/L (ref 22–32)
Calcium: 9.4 mg/dL (ref 8.9–10.3)
Chloride: 99 mmol/L (ref 98–111)
Creatinine, Ser: 0.78 mg/dL (ref 0.44–1.00)
GFR, Estimated: >60 mL/min (ref >60)
Glucose, Bld: 127 mg/dL — ABNORMAL HIGH (ref 70–99)
Potassium: 3.5 mmol/L (ref 3.5–5.1)
Sodium: 138 mmol/L (ref 135–145)

## 2020-10-09 LAB — TROPONIN I (HIGH SENSITIVITY): Troponin I (High Sensitivity): 5 ng/L (ref <18)

## 2020-10-09 LAB — CBC
HCT: 46.7 % — ABNORMAL HIGH (ref 36.0–46.0)
Hemoglobin: 15.2 g/dL — ABNORMAL HIGH (ref 12.0–15.0)
MCH: 32.8 pg (ref 26.0–34.0)
MCHC: 32.5 g/dL (ref 30.0–36.0)
MCV: 100.9 fL — ABNORMAL HIGH (ref 80.0–100.0)
Platelets: 246 10*3/uL (ref 150–400)
RBC: 4.63 MIL/uL (ref 3.87–5.11)
RDW: 11.9 % (ref 11.5–15.5)
WBC: 13.6 10*3/uL — ABNORMAL HIGH (ref 4.0–10.5)
nRBC: 0 % (ref 0.0–0.2)

## 2020-10-09 LAB — POC URINE PREG, ED: Preg Test, Ur: NEGATIVE

## 2020-10-09 MED ORDER — AMOXICILLIN-POT CLAVULANATE 875-125 MG PO TABS: 1.0000 | ORAL_TABLET | Freq: Two times a day (BID) | ORAL | 0 refills | Status: DC

## 2020-10-09 NOTE — Discharge Instructions (Addendum)
Your discomfort appears to be related to a bronchitis infection.  This is likely aggravated by smoking and it would help to stop.  Follow-up with your doctor for checkup.  Take the antibiotic prescription as prescribed.

## 2020-10-09 NOTE — ED Provider Notes (Signed)
[facility] EMERGENCY DEPARTMENT Provider Note   CSN: [id number] Arrival date & time: 10/09/20  1214     History Chief Complaint  Patient presents with  . Chest Pain    Emily Fitzgerald is a 59 y.o. female.  HPI She presents for evaluation of chest discomfort and general achiness, for the last 12 to 24 hours.  She denies fever, chills, shortness of breath, focal weakness or paresthesia.  No prior cardiac history.  She recently had a echocardiogram done, and is due to have a sleep apnea study done, after being evaluated by her pulmonologist.  She continues to smoke and has COPD.  She has a cough productive of clear sputum.  She denies fever, chills, difficulty eating or walking.  There are no other known modifying factors.    Past Medical History:  Diagnosis Date  . COPD (chronic obstructive pulmonary disease) [facility])     Patient Active Problem List   Diagnosis Date Noted  . Transaminasemia   . COPD with acute exacerbation ([place]) 01/08/2018  . Community acquired pneumonia of left lower lobe of lung 01/04/2018  . Acute respiratory failure with hypoxia ([place]) 01/04/2018  . Elevated LFTs 01/04/2018    Past Surgical History:  Procedure Laterality Date  . CESAREAN SECTION       OB History   No obstetric history on file.     Family History  Problem Relation Age of Onset  . Cancer Mother   . Leukemia Father   . CAD Brother     Social History   Tobacco Use  . Smoking status: Current Every Day Smoker    Packs/day: 0.50    Years: 37.00    Pack years: 18.50    Types: Cigarettes  . Smokeless tobacco: Never Used  Vaping Use  . Vaping Use: Never used  Substance Use Topics  . Alcohol use: Yes  . Drug use: No    Home Medications Prior to Admission medications   Medication Sig Start Date End Date Taking? Authorizing Provider  albuterol (PROVENTIL HFA;VENTOLIN HFA) 108 (90 Base) MCG/ACT inhaler Inhale 1-2 puffs into the lungs every 6 (six) hours as needed for wheezing or  shortness of breath.    [provider]  amoxicillin-clavulanate (AUGMENTIN) 875-125 MG tablet Take 1 tablet by mouth 2 (two) times daily. One po bid x 7 days 10/09/20   Daleen Bo, MD  cholecalciferol (VITAMIN D3) 25 MCG (1000 UNIT) tablet Take 1,000 Units by mouth daily.    [provider]  gabapentin (NEURONTIN) 100 MG capsule Take 100 mg by mouth 3 (three) times daily as needed (for pain).  10/02/18   [provider]  ipratropium-albuterol (DUONEB) 0.5-2.5 (3) MG/3ML SOLN Take 3 mLs by nebulization in the morning, at noon, and at bedtime. 06/17/20   Johnson, Clanford L, MD  meloxicam (MOBIC) 7.5 MG tablet Take 7.5 mg by mouth daily.    [provider]  Multiple Vitamin (MULTIVITAMIN WITH MINERALS) TABS tablet Take 1 tablet by mouth daily.    [provider]  omeprazole (PRILOSEC) 20 MG capsule Take 1 capsule (20 mg total) by mouth 2 (two) times daily for 14 days. 06/17/20 07/01/20  Johnson, Clanford L, MD  OXYGEN Inhale 3 L into the lungs daily as needed (FOR SHORTNESS OF BREATH).     [provider]  SYMBICORT 80-4.5 MCG/ACT inhaler Inhale into the lungs. 08/19/20   [provider]    Allergies    Patient has no known allergies.  Review of  Systems   Review of Systems  All other systems reviewed and are negative.   Physical Exam Updated Vital Signs BP (!) 164/86   Pulse (!) 107   Temp 98.1 F (36.7 C) (Oral)   Resp 17   Ht 5\' 6"  (1.676 m)   Wt 90.7 kg   SpO2 94%   BMI 32.28 kg/m   Physical Exam Vitals and nursing note reviewed.  Constitutional:      General: She is not in acute distress.    Appearance: She is well-developed. She is obese. She is not ill-appearing, toxic-appearing or diaphoretic.  HENT:     Head: Normocephalic and atraumatic.     Right Ear: External ear normal.     Left Ear: External ear normal.  Eyes:     Conjunctiva/sclera: Conjunctivae normal.     Pupils: Pupils are equal, round, and  reactive to light.  Neck:     Trachea: Phonation normal.  Cardiovascular:     Rate and Rhythm: Normal rate and regular rhythm.     Heart sounds: Normal heart sounds.  Pulmonary:     Effort: Pulmonary effort is normal. No respiratory distress.     Breath sounds: Normal breath sounds. No stridor.  Chest:     Chest wall: No tenderness.  Abdominal:     General: There is no distension.     Palpations: Abdomen is soft.     Tenderness: There is no abdominal tenderness.  Musculoskeletal:        General: Normal range of motion.     Cervical back: Normal range of motion and neck supple.  Skin:    General: Skin is warm and dry.  Neurological:     Mental Status: She is alert and oriented to person, place, and time.     Cranial Nerves: No cranial nerve deficit.     Sensory: No sensory deficit.     Motor: No abnormal muscle tone.     Coordination: Coordination normal.  Psychiatric:        Mood and Affect: Mood normal.        Behavior: Behavior normal.        Thought Content: Thought content normal.        Judgment: Judgment normal.     ED Results / Procedures / Treatments   Labs (all labs ordered are listed, but only abnormal results are displayed) Labs Reviewed  BASIC METABOLIC PANEL - Abnormal; Notable for the following components:      Result Value   Glucose, Bld 127 (*)    All other components within normal limits  CBC - Abnormal; Notable for the following components:   WBC 13.6 (*)    Hemoglobin 15.2 (*)    HCT 46.7 (*)    MCV 100.9 (*)    All other components within normal limits  POC URINE PREG, ED  TROPONIN I (HIGH SENSITIVITY)    EKG EKG Interpretation  Date/Time:  Sunday October 09 2020 12:30:19 EST Ventricular Rate:  109 PR Interval:    QRS Duration: 94 QT Interval:  336 QTC Calculation: 453 R Axis:   43 Text Interpretation: Sinus tachycardia Anterior infarct, old ST elevation, consider inferior injury since last tracing no significant change Confirmed by  Daleen Bo [phone]) on 10/09/2020 1:24:29 PM   Radiology DG Chest 2 View  Result Date: 10/09/2020 CLINICAL DATA:  Chest pain. EXAM: CHEST - 2 VIEW COMPARISON:  September 09, 2020 FINDINGS: Cardiomediastinal silhouette is normal. Mediastinal contours appear intact. There is no  evidence of focal airspace consolidation, pleural effusion or pneumothorax. Osseous structures are without acute abnormality. Soft tissues are grossly normal. IMPRESSION: No active cardiopulmonary disease. Electronically Signed   By: Fidela Salisbury M.D.   On: 10/09/2020 13:09    Procedures Procedures (including critical care time)  Medications Ordered in ED Medications - No data to display  ED Course  I have reviewed the triage vital signs and the nursing notes.  Pertinent labs & imaging results that were available during my care of the patient were reviewed by me and considered in my medical decision making (see chart for details).  Clinical Course as of Oct 09 1345  Sun Oct 09, 2020  1324 No infiltrate or edema  DG Chest 2 View [EW]    Clinical Course User Index [EW] Daleen Bo, MD   MDM Rules/Calculators/A&P                           Patient Vitals for the past 24 hrs:  BP Temp Temp src Pulse Resp SpO2 Height Weight  10/09/20 1313 -- -- -- (!) 107 17 94 % -- --  10/09/20 1307 (!) 164/86 -- -- -- -- -- -- --  10/09/20 1250 (!) 144/92 -- -- -- 15 94 % -- --  10/09/20 1230 (!) 164/95 -- -- (!) 115 20 92 % -- --  10/09/20 1226 (!) 170/98 98.1 F (36.7 C) Oral (!) 112 20 91 % -- --  10/09/20 1224 -- -- -- -- -- -- 5\' 6"  (1.676 m) 90.7 kg    1:46 PM Reevaluation with update and discussion. After initial assessment and treatment, an updated evaluation reveals she remains comfortable has no further complaints.  Findings discussed with the patient and all questions were answered. Daleen Bo   Medical Decision Making:  This patient is presenting for evaluation of chest discomfort and myalgia,  which does require a range of treatment options, and is a complaint that involves a high risk of morbidity and mortality. The differential diagnoses include pneumonia, bronchitis, COPD exacerbation, acute coronary syndrome. I decided to review old records, and in summary middle-aged female, with COPD, being evaluated for sleep apnea, presenting with nonspecific pain and generalized achiness..  I did not require additional historical information from anyone.  Clinical Laboratory Tests Ordered, included CBC, Metabolic panel and Troponin, pregnancy test. Review indicates normal except mild white blood cell count elevation. Radiologic Tests Ordered, included chest x-ray.  I independently Visualized: Radiographic images, which show no infiltrate or edema  Cardiac Monitor Tracing which shows sinus tachycardia    Critical Interventions-clinical evaluation, laboratory testing, radiography, observation and reassessment  After These Interventions, the Patient was reevaluated and was found stable for discharge.  Evaluation consistent bronchitis likely related to tobacco abuse.  Doubt pneumonia, PE, ACS, metabolic disorder or impending vascular collapse.  CRITICAL CARE-no Performed by: Daleen Bo  Nursing Notes Reviewed/ Care Coordinated Applicable Imaging Reviewed Interpretation of Laboratory Data incorporated into ED treatment  The patient appears reasonably screened and/or stabilized for discharge and I doubt any other medical condition or other [facility] requiring further screening, evaluation, or treatment in the ED at this time prior to discharge.  Plan: Home Medications-continue usual; Home Treatments-stop smoking; return here if the recommended treatment, does not improve the symptoms; Recommended follow up-PCP, as needed     Final Clinical Impression(s) / ED Diagnoses Final diagnoses:  Bronchitis  Tobacco abuse    Rx / DC Orders ED Discharge Orders  Ordered     amoxicillin-clavulanate (AUGMENTIN) 875-125 MG tablet  2 times daily        10/09/20 1345           Daleen Bo, MD 10/09/20 1351

## 2020-10-09 NOTE — ED Triage Notes (Signed)
Pt to er, pt states that she is here for chest, pain, reports a gradual onset this am, states that her pain is worse whey she lays down a certain way and is better when she sits in a certain way.  Pt reports some sob and "it hurts to breath", states that this feels like when she had pneumonia.

## 2020-10-28 ENCOUNTER — Other Ambulatory Visit (HOSPITAL_COMMUNITY)
Admission: RE | Admit: 2020-10-28 | Discharge: 2020-10-28 | Disposition: A | Discharge status: Home / Self Care | Payer: Medicaid Other | Source: Ambulatory Visit | Attending: Pulmonary Disease | Admitting: Pulmonary Disease

## 2020-10-28 NOTE — Progress Notes (Signed)
Called patient left a message with her voicemail. I asked patient to call me if she can come later today or Monday. If not I explained to her she would need to call her dr.'s office to reschedule. I'll wait to hear from her. Leaving encounter open. I called pt. Again a little after 1:00. I had to leave another voicemail. Hoping patient will call me back. Spoke with patient, I scheduled her and appointment for Monday at 9:15am. Pt. Called back Monday morning asking if if she could come in a little later. Gave pt. a 10:15 appt..  Pt. Showed up.Nothing further needed.

## 2020-10-31 ENCOUNTER — Other Ambulatory Visit (HOSPITAL_COMMUNITY)
Admission: RE | Admit: 2020-10-31 | Discharge: 2020-10-31 | Disposition: A | Discharge status: Home / Self Care | Payer: Medicaid Other | Source: Ambulatory Visit | Attending: Pulmonary Disease | Admitting: Pulmonary Disease

## 2020-10-31 ENCOUNTER — Other Ambulatory Visit: Payer: Self-pay

## 2020-10-31 DIAGNOSIS — Z01812 Encounter for preprocedural laboratory examination: Secondary | ICD-10-CM | POA: Diagnosis not present

## 2020-10-31 DIAGNOSIS — Z20822 Contact with and (suspected) exposure to covid-19: Secondary | ICD-10-CM | POA: Diagnosis not present

## 2020-10-31 LAB — SARS CORONAVIRUS 2 (TAT 6-24 HRS): SARS Coronavirus 2: NEGATIVE

## 2020-11-01 ENCOUNTER — Ambulatory Visit (HOSPITAL_COMMUNITY): Admission: RE | Admit: 2020-11-01 | Payer: Medicaid Other | Source: Ambulatory Visit

## 2020-11-03 ENCOUNTER — Encounter: Payer: Self-pay | Admitting: Pulmonary Disease

## 2020-11-03 ENCOUNTER — Ambulatory Visit (INDEPENDENT_AMBULATORY_CARE_PROVIDER_SITE_OTHER): Payer: Medicaid Other | Admitting: Pulmonary Disease

## 2020-11-03 ENCOUNTER — Other Ambulatory Visit: Payer: Self-pay

## 2020-11-03 VITALS — BP 124/80 | HR 103 | Temp 97.1°F | Ht 66.0 in | Wt 197.0 lb

## 2020-11-03 DIAGNOSIS — J449 Chronic obstructive pulmonary disease, unspecified: Secondary | ICD-10-CM

## 2020-11-03 DIAGNOSIS — D751 Secondary polycythemia: Secondary | ICD-10-CM

## 2020-11-03 DIAGNOSIS — Z720 Tobacco use: Secondary | ICD-10-CM

## 2020-11-03 NOTE — Patient Instructions (Signed)
Will reschedule overnight oxygen test and pulmonary function test  Follow up in 4 months

## 2020-11-03 NOTE — Progress Notes (Signed)
[name]  Chief Complaint  Patient presents with  . Follow-up    "a little bit of shortness of breath" Non productive cough    Constitutional:  BP 124/80 (BP Location: Left Arm, Cuff Size: Normal)   Pulse (!) 103   Temp (!) 97.1 F (36.2 C) (Other (Comment)) Comment (Src): wrist  Ht 5\' 6"  (1.676 m)   Wt 197 lb (89.4 kg)   SpO2 93% Comment: Room air  BMI 31.80 kg/m   Past Medical History:  Polycythemia, Allergies, Vit D deficiency, Influenza B February 2019  Past Surgical History:  Her  has a past surgical history that includes Cesarean section.  Brief Summary:  Emily Fitzgerald is a 59 y.o. female smoker with COPD and chronic respiratory failure with hypoxia and hypercapnia.       Subjective:   Chest xray from 09/11/20 changes of COPD.  Hb from 09/09/20 was 15.3.  Echo showed mild LVH and grade 1 diastolic dysfunction.  She hasn't completed PFT yet, overnight oximetry or 6 MWT.  She was seen in the ER on 10/09/20 for bronchitis and treated with augmentin.  Breathing better now.  She is down to 1/2 ppd.  She has been walking at [facility] several times per week and has lost about 10 lbs.    Physical Exam:   Appearance - well kempt   ENMT - no sinus tenderness, no oral exudate, no LAN, Mallampati 2 airway, no stridor, poor dentition  Respiratory - equal breath sounds bilaterally, no wheezing or rales  CV - s1s2 regular rate and rhythm, no murmurs  Ext - no clubbing, no edema  Skin - no rashes  Psych - normal mood and affect    Pulmonary testing:   A1AT 09/09/20 >> 125, MM  Chest Imaging:    Sleep Tests:    Cardiac Tests:   Echo 09/15/20 >> EF 60 to 65%, mild LVH, grade 1 DD  Social History:  She  reports that she has been smoking cigarettes. She has a 18.50 pack-year smoking history. She has never used smokeless tobacco. She reports current alcohol use. She reports that she does not use drugs.  Family History:   Her family history includes CAD in her brother; Cancer in her mother; Leukemia in her father.    Assessment/Plan:   COPD with emphysema. - continue symbicort - prn albuterol, duoneb - will reschedule PFT  Chronic respiratory failure with hypoxia and hypercapnia. - she uses 3 liters oxygen prn with activity - will arrange for overnight oximetry on room air  Tobacco abuse. 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 Lindalou Hose with [facility] at [facility]  for lumpectomy - pulmonary status optimize and she can proceed with surgery if needed  Secondary polycythemia. - followed by Dr. Silvestre Mesi at [facility]   Time Spent Involved in Patient Care on Day of Examination:  31 minutes  Follow up:  Patient Instructions  Will reschedule overnight oxygen test and pulmonary function test  Follow up in 4 months   Medication List:   Allergies as of 11/03/2020   No Known Allergies     Medication List       Accurate as of November 03, 2020 10:22 AM. If you have any questions, ask your nurse or doctor.  STOP taking these medications   amoxicillin-clavulanate 875-125 MG tablet Commonly known as: Augmentin Stopped by: Chesley Mires, MD     TAKE these medications   albuterol 108 (90 Base) MCG/ACT inhaler Commonly known as: VENTOLIN HFA Inhale 1-2 puffs into the lungs every 6 (six) hours as needed for wheezing or shortness of breath.   cholecalciferol 25 MCG (1000 UNIT) tablet Commonly known as: VITAMIN D3 Take 1,000 Units by mouth daily.   gabapentin 100 MG capsule Commonly known as: NEURONTIN Take 100 mg by mouth 3 (three) times daily as needed (for pain).   ipratropium-albuterol 0.5-2.5 (3) MG/3ML Soln Commonly known as: DUONEB Take 3 mLs by nebulization in the morning,  at noon, and at bedtime.   meloxicam 7.5 MG tablet Commonly known as: MOBIC Take 7.5 mg by mouth daily.   multivitamin with minerals Tabs tablet Take 1 tablet by mouth daily.   omeprazole 20 MG capsule Commonly known as: PriLOSEC Take 1 capsule (20 mg total) by mouth 2 (two) times daily for 14 days.   OXYGEN Inhale 3 L into the lungs daily as needed (FOR SHORTNESS OF BREATH).   Symbicort 80-4.5 MCG/ACT inhaler Generic drug: budesonide-formoterol Inhale into the lungs.       Signature:  Chesley Mires, MD [place] Pager - [phone] 11/03/2020, 10:22 AM

## 2020-11-17 IMAGING — US US BREAST*R* LIMITED INC AXILLA
1 series · 5 of 5 positions shown · non-contrast
Comparison: Screening mammogram dated 03/03/2020.

CLINICAL DATA: Screening recall for mass with associated
calcifications in the right breast.

EXAM:
DIGITAL DIAGNOSTIC UNILATERAL RIGHT MAMMOGRAM WITH CAD AND TOMO
RIGHT BREAST ULTRASOUND

[Series 1: us breast*right* limited inc axilla · 0.07mm/px · 5 of 5 slices shown]
[im 1/5]
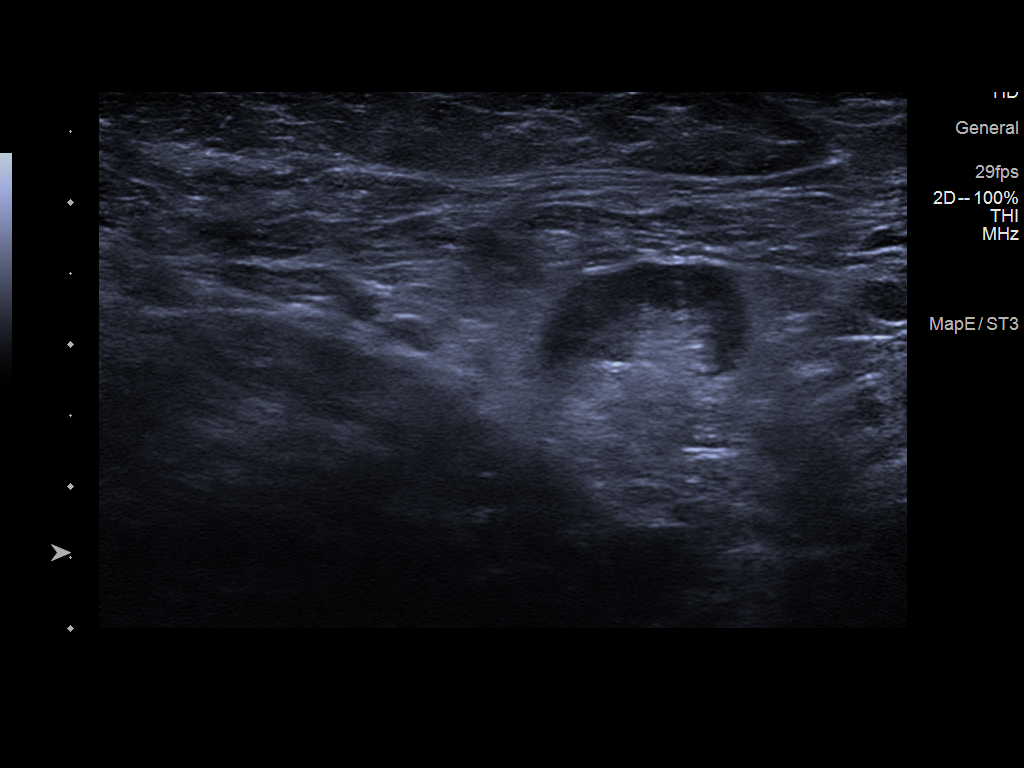
[im 2/5]
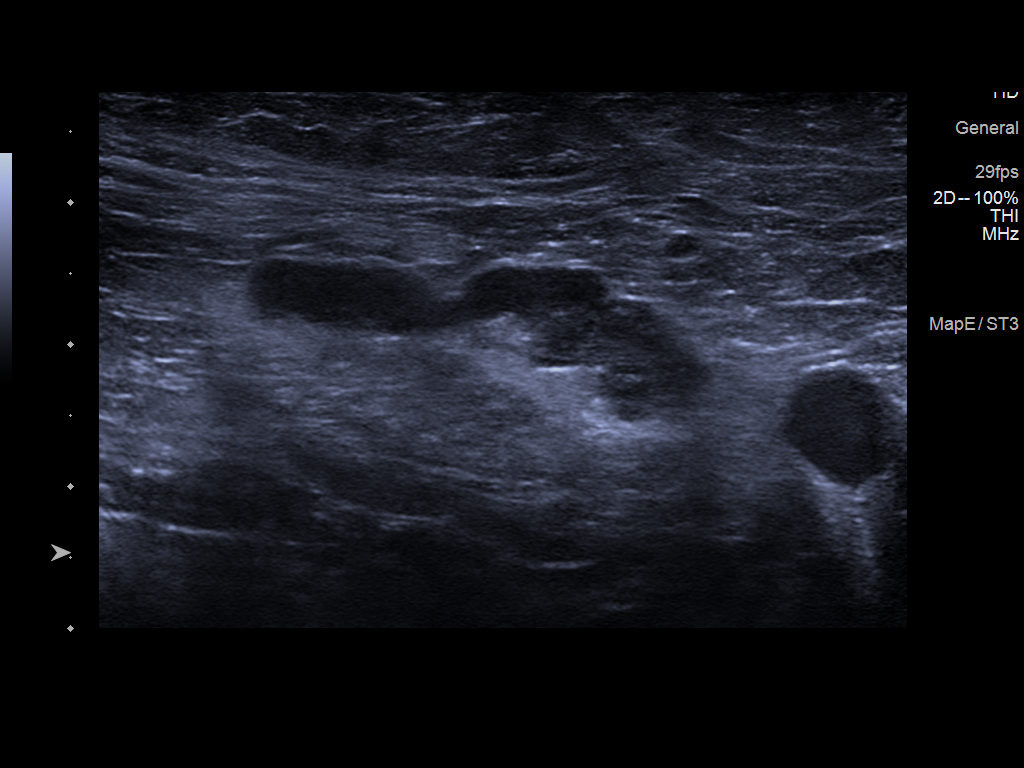
[im 3/5]
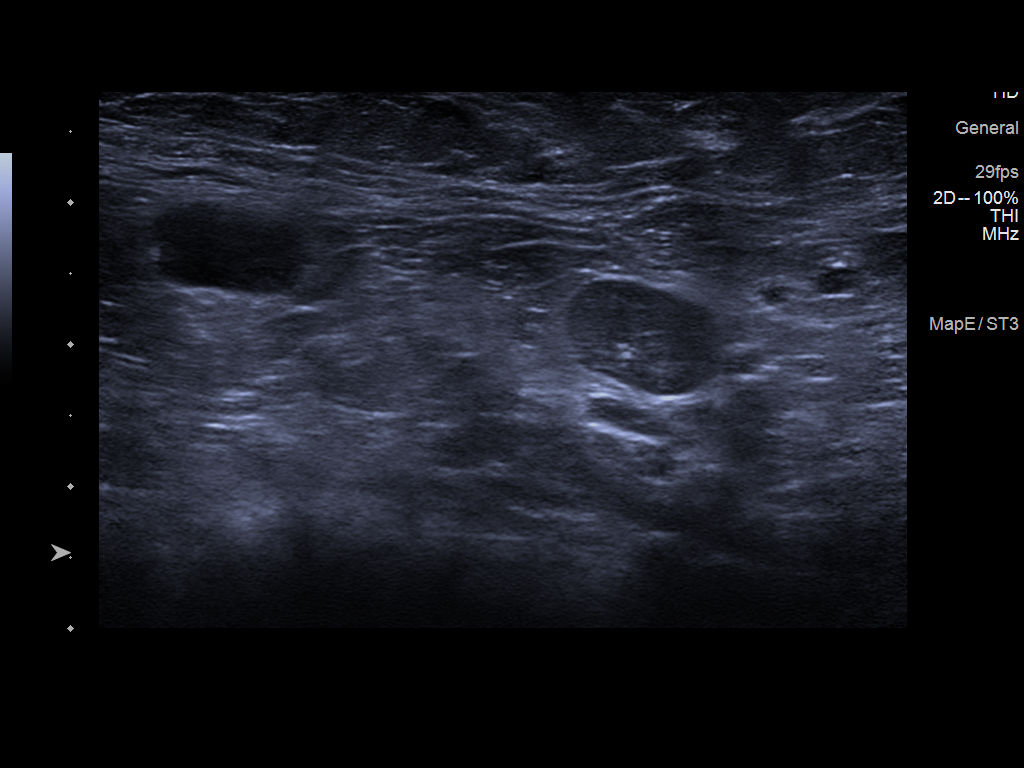
[im 4/5]
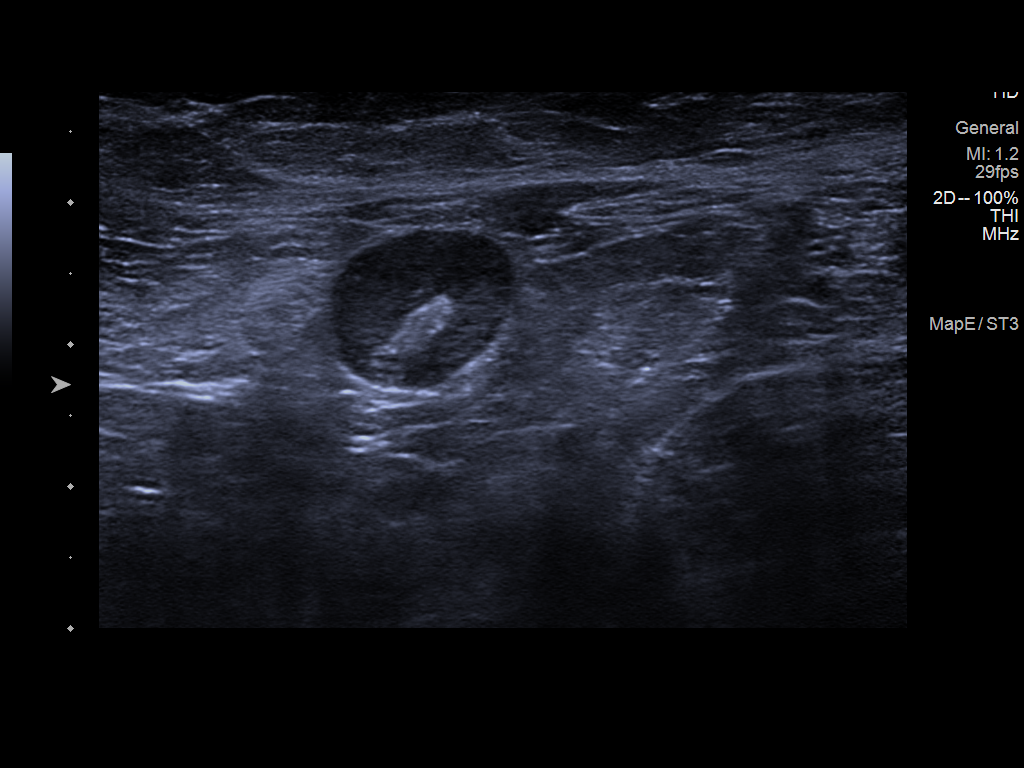
[im 5/5]
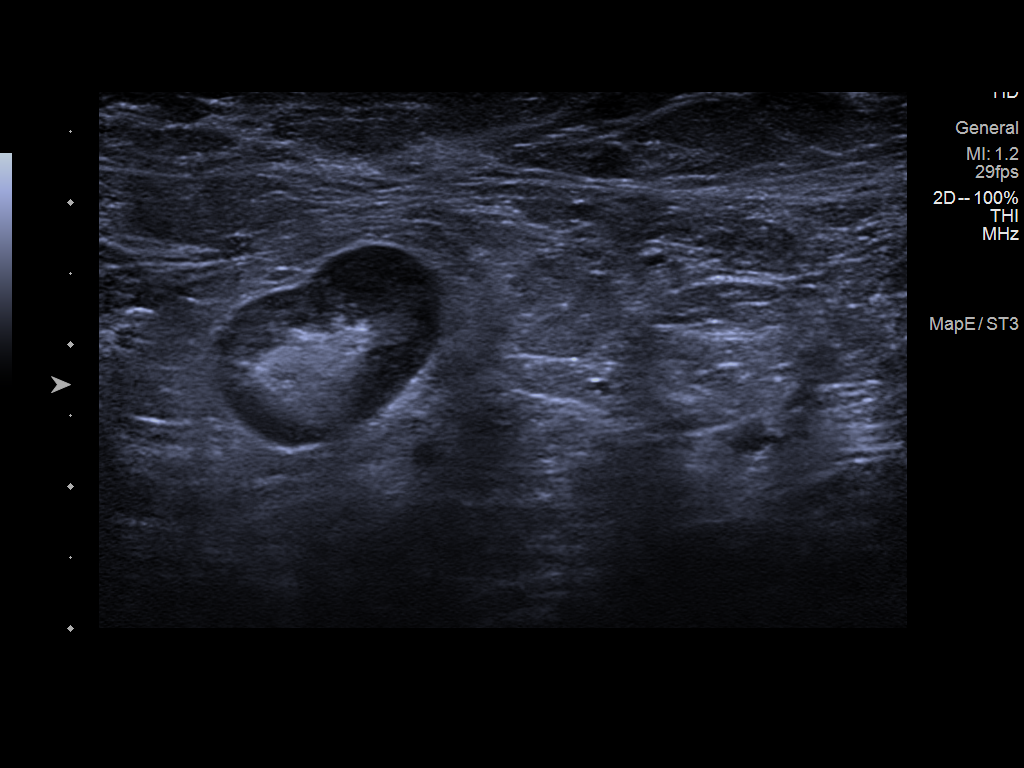

[5 of 5 positions shown; findings below may reference images not displayed]

ACR Breast Density Category b: There are scattered areas of
fibroglandular density.
FINDINGS: Additional tomograms as well as spot compression magnification views
were performed of the right breast. There is a persistent area of
distortion with associated calcifications in the outer right breast
posterior depth spanning 2.6 cm. In addition, there is an additional
faint group of punctate/amorphous calcifications with associated
asymmetry in the lower outer right breast mid depth measuring
cm.

Mammographic images were processed with CAD.

Physical examination of the outer right breast does not reveal any
palpable masses.

Targeted ultrasound of the outer right breast was performed. There
is a vague area of shadowing in the far outer right breast, however
no definite reproducible sonographic correlate for the distortion
with associated calcifications seen at mammography.

Targeted ultrasound of the right axilla was performed. There are
lymph nodes with mild cortical thickening, however preserved
echogenic/fatty hilum. These are symmetric when compared to the
lymph nodes in the left axilla and felt to be within normal limits
for this patient.
IMPRESSION: 1. Suspicious distortion with associated calcifications in the outer
right breast posterior depth spanning 2.6 cm.

2. Indeterminate calcifications with associated density in the lower
outer right breast mid depth spanning 1.3 cm.

3. Right axillary lymph nodes with mild cortical thickening however
preserved echogenic/fatty hila, symmetric when compared to the left
axilla and felt to be within normal limits for the patient.

RECOMMENDATION:
1. Recommend stereotactic guided biopsy of the distortion with
associated calcifications spanning 2.6 cm in the outer posterior
right breast.

2. Recommend stereotactic guided biopsy of the calcifications with
associated density in the lower outer right breast mid depth
spanning 1.3 cm.

3. If pathology results from above biopsies return as malignant,
then recommend the patient return for ultrasound-guided core biopsy
of 1 of the right axillary lymph nodes with mild cortical
thickening. If pathology results are negative for malignancy, then
these lymph nodes can be considered within normal limits for this
patient.

I have discussed the findings and recommendations with the patient.
If applicable, a reminder letter will be sent to the patient
regarding the next appointment.

BI-RADS CATEGORY  4: Suspicious.

## 2020-12-06 ENCOUNTER — Telehealth: Payer: Self-pay | Admitting: Pulmonary Disease

## 2020-12-06 NOTE — Telephone Encounter (Signed)
Called and left message on voicemail to please return phone call to go over ONO results. Contact number provided.

## 2020-12-06 NOTE — Telephone Encounter (Signed)
ONO with RA 11/14/20 >> test time 10 hrs 40 min.  Baseline SpO2 82%, low SpO2 59%.  Spent 9 hrs 54 min with SpO2 < 88%.   Please let her know that her oxygen level is low at night.  She needs to wear 3 liters oxygen at night in addition to wearing 3 liters oxygen with exertion.  Please make sure she has oxygen concentrator at home to use 3 liters oxygen at night.

## 2020-12-07 ENCOUNTER — Encounter: Payer: Self-pay | Admitting: *Deleted

## 2020-12-07 ENCOUNTER — Telehealth: Payer: Self-pay | Admitting: Pulmonary Disease

## 2020-12-07 NOTE — Telephone Encounter (Signed)
ATC patient to let her know ONO results looks like we have tried to call her a few times about it. Per DPR left detailed message with results from Dr. Halford Chessman and advised patient to call the office back if she did not have an oxygen concentrator at home for her to use at night when she is sleeping. Will leave open for now to see if patient calls back

## 2020-12-07 NOTE — Telephone Encounter (Signed)
Called and left another msg to call back  Will go ahead and mail letter as well

## 2020-12-08 NOTE — Telephone Encounter (Signed)
Called and spoke with patient to see if she had any questions from voicemail left about ONO. Verified with patient that she needs to wear 3 liters oxygen at night when sleeping as well as 3 liters with exertion. Patient stated that she does have a concentrator at home. Patient expressed understanding about wearing oxygen. Nothing further needed at this time.

## 2020-12-08 NOTE — Telephone Encounter (Signed)
Patient is returning phone call. Patient phone number is [phone].

## 2021-04-30 IMAGING — DX DG CHEST 2V
2 series · 2 of 2 positions shown · non-contrast
Comparison: 06/15/2020 and prior studies.

CLINICAL DATA: 59-year-old female with chronic cough.

EXAM:
CHEST - 2 VIEW

[chest pa]
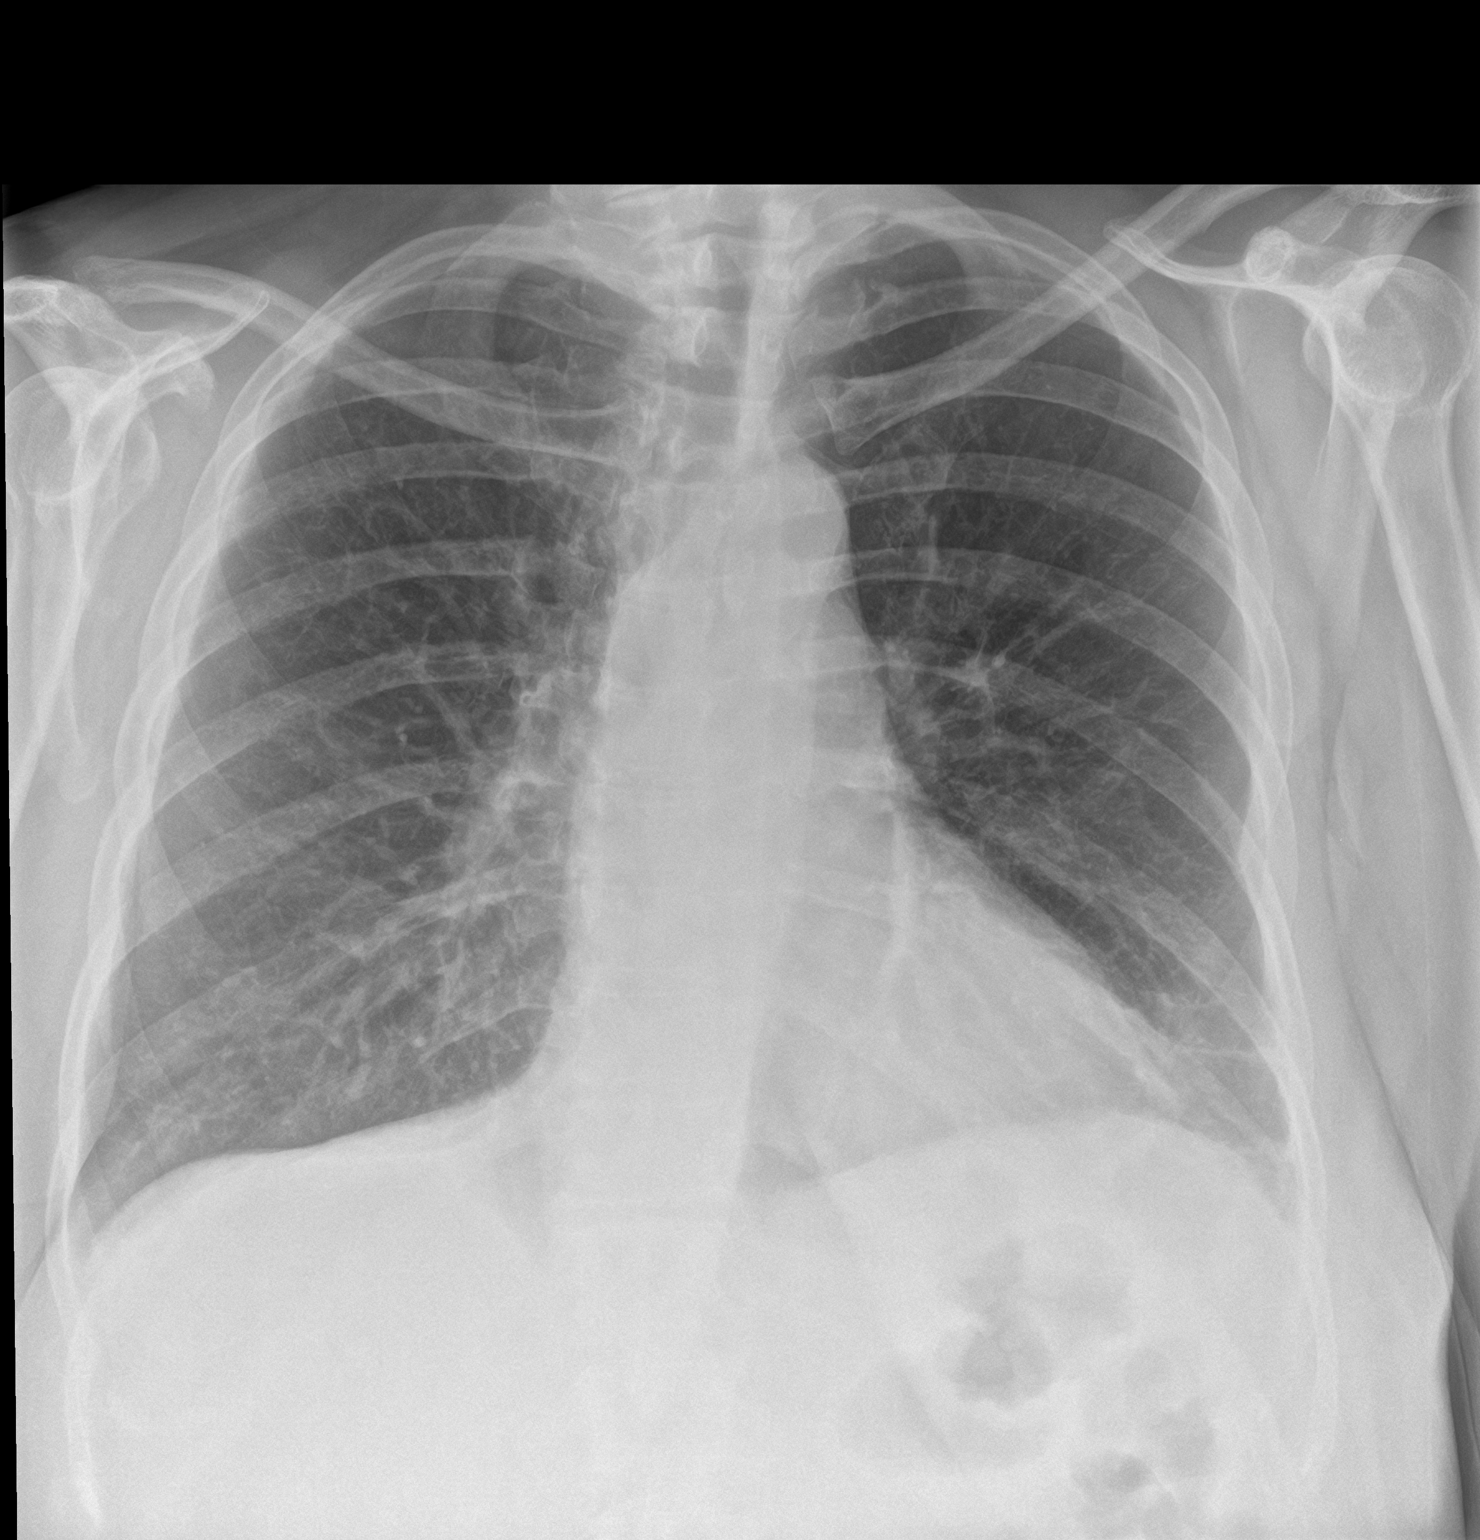

[chest lat]
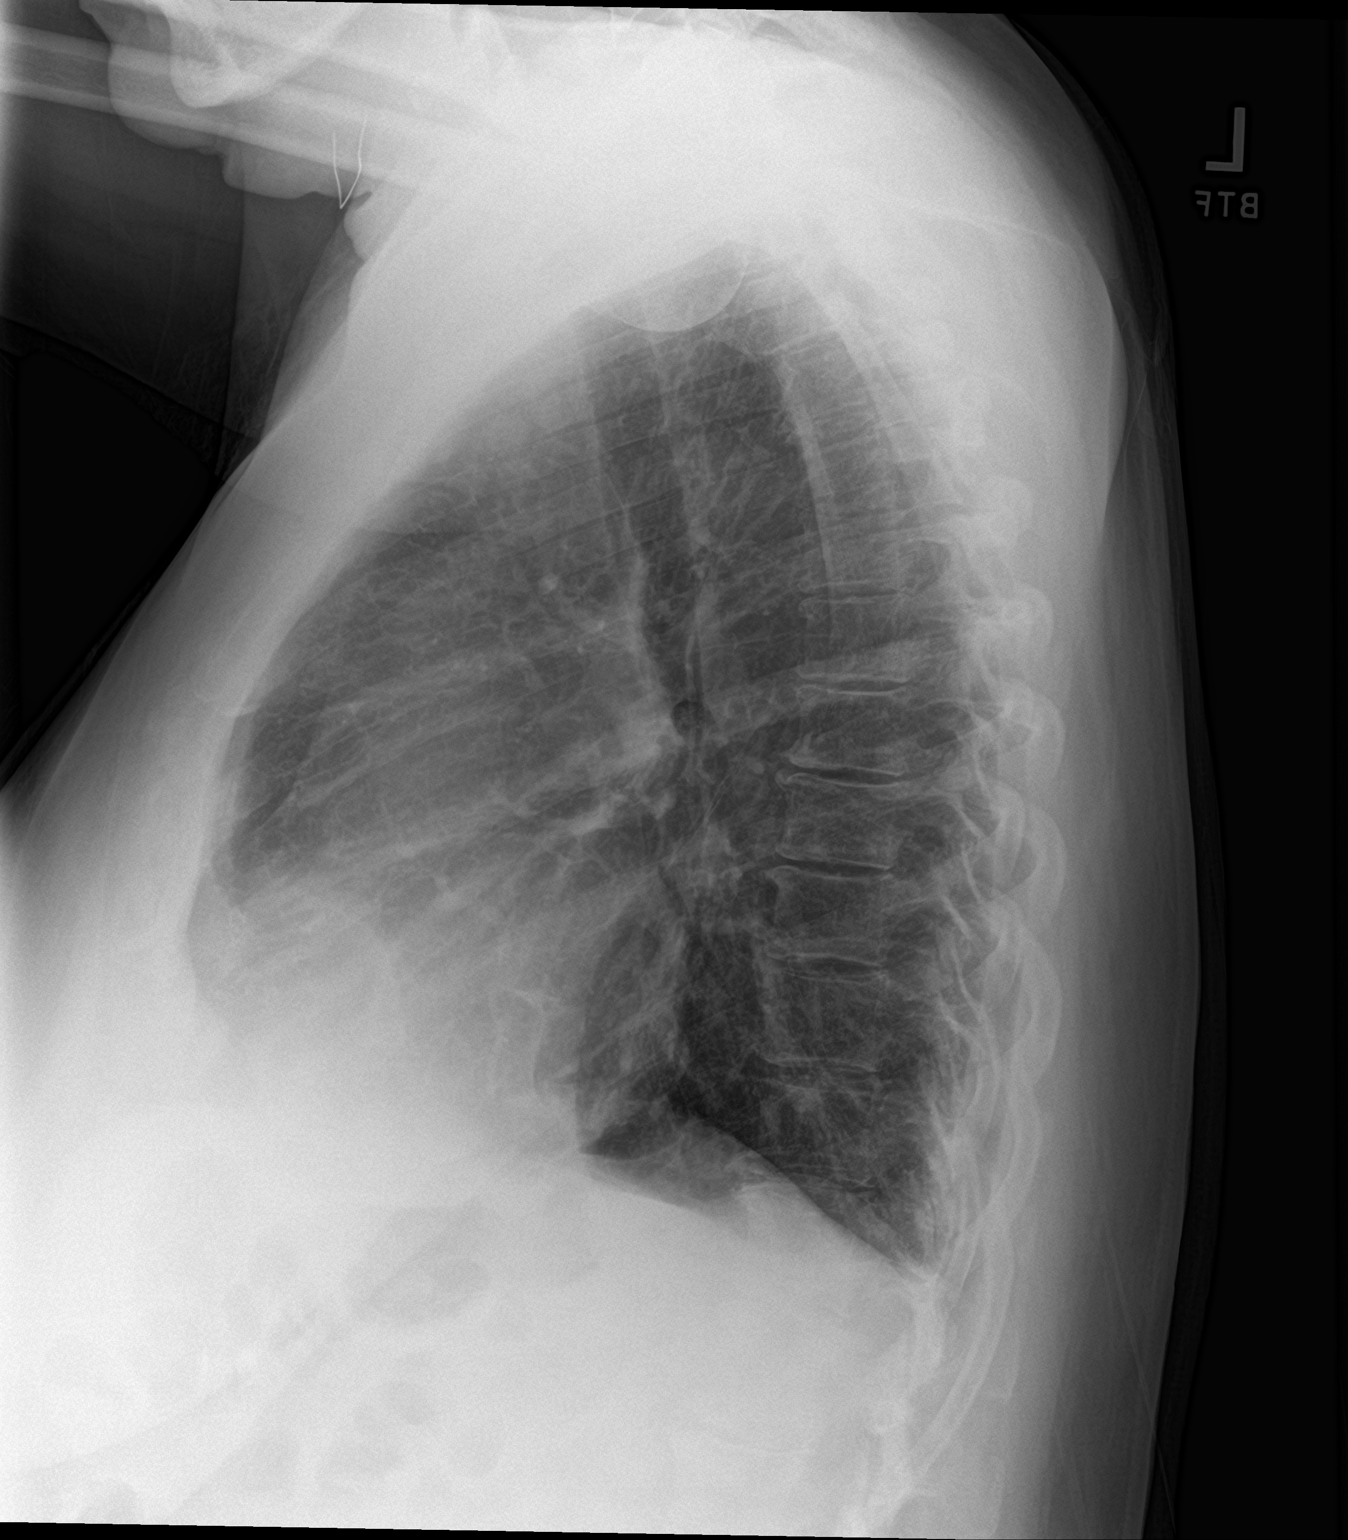

[2 of 2 positions shown; findings below may reference images not displayed]

FINDINGS: The cardiomediastinal silhouette is unremarkable.

Minimal LEFT basilar scarring/atelectasis again noted.

There is no evidence of focal airspace disease, pulmonary edema,
suspicious pulmonary nodule/mass, pleural effusion, or pneumothorax.

No acute bony abnormalities are identified.
IMPRESSION: No active cardiopulmonary disease.

## 2021-05-05 ENCOUNTER — Telehealth: Payer: Self-pay | Admitting: Pulmonary Disease

## 2021-05-05 NOTE — Telephone Encounter (Signed)
There are 2 pft orders for pt with documentation that we left vm's with [facility] to call pt to schedule.  I just called & left them another vm.  I called pt & left her a vm letting her know [facility] schedules the pft's & gave her their phone # - [phone].  Nothing further needed.

## 2021-06-22 ENCOUNTER — Encounter (HOSPITAL_COMMUNITY): Payer: Self-pay | Admitting: *Deleted

## 2021-06-22 ENCOUNTER — Emergency Department (HOSPITAL_COMMUNITY)
Admission: EM | Admit: 2021-06-22 | Discharge: 2021-06-22 | Disposition: A | Discharge status: Home / Self Care | Payer: Medicaid Other | Attending: Emergency Medicine | Admitting: Emergency Medicine

## 2021-06-22 ENCOUNTER — Emergency Department (HOSPITAL_COMMUNITY): Payer: Medicaid Other

## 2021-06-22 ENCOUNTER — Other Ambulatory Visit: Payer: Self-pay

## 2021-06-22 DIAGNOSIS — R109 Unspecified abdominal pain: Secondary | ICD-10-CM | POA: Insufficient documentation

## 2021-06-22 DIAGNOSIS — F17210 Nicotine dependence, cigarettes, uncomplicated: Secondary | ICD-10-CM | POA: Diagnosis not present

## 2021-06-22 DIAGNOSIS — J449 Chronic obstructive pulmonary disease, unspecified: Secondary | ICD-10-CM | POA: Diagnosis not present

## 2021-06-22 DIAGNOSIS — Z7951 Long term (current) use of inhaled steroids: Secondary | ICD-10-CM | POA: Diagnosis not present

## 2021-06-22 DIAGNOSIS — T148XXA Other injury of unspecified body region, initial encounter: Secondary | ICD-10-CM

## 2021-06-22 LAB — URINALYSIS, ROUTINE W REFLEX MICROSCOPIC
Bilirubin Urine: NEGATIVE
Glucose, UA: NEGATIVE mg/dL
Hgb urine dipstick: NEGATIVE
Ketones, ur: NEGATIVE mg/dL
Leukocytes,Ua: NEGATIVE
Nitrite: NEGATIVE
Protein, ur: NEGATIVE mg/dL
Specific Gravity, Urine: 1.019 (ref 1.005–1.030)
pH: 6 (ref 5.0–8.0)

## 2021-06-22 LAB — COMPREHENSIVE METABOLIC PANEL
ALT: 33 U/L (ref 0–44)
AST: 22 U/L (ref 15–41)
Albumin: 4.1 g/dL (ref 3.5–5.0)
Alkaline Phosphatase: 81 U/L (ref 38–126)
Anion gap: 8 (ref 5–15)
BUN: 13 mg/dL (ref 6–20)
CO2: 31 mmol/L (ref 22–32)
Calcium: 9.2 mg/dL (ref 8.9–10.3)
Chloride: 98 mmol/L (ref 98–111)
Creatinine, Ser: 0.58 mg/dL (ref 0.44–1.00)
GFR, Estimated: >60 mL/min (ref >60)
Glucose, Bld: 102 mg/dL — ABNORMAL HIGH (ref 70–99)
Potassium: 3.8 mmol/L (ref 3.5–5.1)
Sodium: 137 mmol/L (ref 135–145)
Total Bilirubin: 1.1 mg/dL (ref 0.3–1.2)
Total Protein: 7.2 g/dL (ref 6.5–8.1)

## 2021-06-22 LAB — CBC WITH DIFFERENTIAL/PLATELET
Abs Immature Granulocytes: 0.04 10*3/uL (ref 0.00–0.07)
Basophils Absolute: 0 10*3/uL (ref 0.0–0.1)
Basophils Relative: 0 %
Eosinophils Absolute: 0.1 10*3/uL (ref 0.0–0.5)
Eosinophils Relative: 1 %
HCT: 49.9 % — ABNORMAL HIGH (ref 36.0–46.0)
Hemoglobin: 16.5 g/dL — ABNORMAL HIGH (ref 12.0–15.0)
Immature Granulocytes: 0 %
Lymphocytes Relative: 58 %
Lymphs Abs: 8.2 10*3/uL — ABNORMAL HIGH (ref 0.7–4.0)
MCH: 34 pg (ref 26.0–34.0)
MCHC: 33.1 g/dL (ref 30.0–36.0)
MCV: 102.9 fL — ABNORMAL HIGH (ref 80.0–100.0)
Monocytes Absolute: 0.4 10*3/uL (ref 0.1–1.0)
Monocytes Relative: 3 %
Neutro Abs: 5.4 10*3/uL (ref 1.7–7.7)
Neutrophils Relative %: 38 %
Platelets: 229 10*3/uL (ref 150–400)
RBC: 4.85 MIL/uL (ref 3.87–5.11)
RDW: 11.9 % (ref 11.5–15.5)
WBC: 14.2 10*3/uL — ABNORMAL HIGH (ref 4.0–10.5)
nRBC: 0 % (ref 0.0–0.2)

## 2021-06-22 LAB — LIPASE, BLOOD: Lipase: 31 U/L (ref 11–51)

## 2021-06-22 MED ORDER — MELOXICAM 7.5 MG PO TABS: 7.5000 mg | ORAL_TABLET | Freq: Every day | ORAL | 0 refills | Status: DC

## 2021-06-22 MED ORDER — MORPHINE SULFATE (PF) 4 MG/ML IV SOLN
4.0000 mg | Freq: Once | INTRAVENOUS | Status: AC
  Administered 2021-06-22: 4 mg via INTRAVENOUS
  Filled 2021-06-22: qty 1

## 2021-06-22 MED ORDER — LIDOCAINE 5 % EX PTCH: 1.0000 | MEDICATED_PATCH | CUTANEOUS | 0 refills | Status: DC

## 2021-06-22 MED ORDER — LIDOCAINE 5 % EX PTCH
1.0000 | MEDICATED_PATCH | CUTANEOUS | Status: DC
  Administered 2021-06-22: 1 via TRANSDERMAL
  Filled 2021-06-22: qty 1

## 2021-06-22 MED ORDER — ONDANSETRON HCL 4 MG/2ML IJ SOLN
4.0000 mg | Freq: Once | INTRAMUSCULAR | Status: AC
  Administered 2021-06-22: 4 mg via INTRAVENOUS
  Filled 2021-06-22: qty 2

## 2021-06-22 NOTE — Discharge Instructions (Addendum)
Take meloxicam twice daily for the next 7 days.  Take it with food and water as it can upset the GI lining. You can apply a Lidoderm patch to the area that is hurting, the ED trial did not work you do not have to pick this up. Please follow-up with your primary care doctor regarding your work-up here today when you see them 8/10. If things worsen or change return back to the ED for further evaluation.

## 2021-06-22 NOTE — ED Triage Notes (Signed)
Left flank pain for the past 2-3 days, worse with movement

## 2021-06-22 NOTE — ED Provider Notes (Signed)
[facility] EMERGENCY DEPARTMENT Provider Note   CSN: [id number] Arrival date & time: 06/22/21  1034     History Chief Complaint  Patient presents with   left flank pain    Emily Fitzgerald is a 60 y.o. female.  HPI  Patient presents with left flank pain x3 days.  The pain is intermittent, worse with movement.  She has tried ibuprofen without relief.  No associated dysuria or hematuria, no history of kidney stones, but history of kidney infections.  She is not having any fevers or chills.  No nausea or vomiting.  Had 2 previous C-sections, but otherwise denies any abdominal surgeries.  No recent accidents or traumas to the side of her body, no recent workouts or strains to that area.  Past Medical History:  Diagnosis Date   COPD (chronic obstructive pulmonary disease) ([place])    Influenza B 12/2017   Polycythemia    Seasonal allergies    Vitamin D deficiency     Patient Active Problem List   Diagnosis Date Noted   Transaminasemia    COPD with acute exacerbation ([place]) 01/08/2018   Community acquired pneumonia of left lower lobe of lung 01/04/2018   Acute respiratory failure with hypoxia ([place]) 01/04/2018   Elevated LFTs 01/04/2018    Past Surgical History:  Procedure Laterality Date   CESAREAN SECTION       OB History   No obstetric history on file.     Family History  Problem Relation Age of Onset   Cancer Mother    Leukemia Father    CAD Brother     Social History   Tobacco Use   Smoking status: Every Day    Packs/day: 0.50    Years: 37.00    Pack years: 18.50    Types: Cigarettes   Smokeless tobacco: Never   Tobacco comments:    Smokes 1/2 pack per day 11/03/2020  Vaping Use   Vaping Use: Never used  Substance Use Topics   Alcohol use: Yes   Drug use: No    Home Medications Prior to Admission medications   Medication Sig Start Date End Date Taking? Authorizing Provider  albuterol (PROVENTIL HFA;VENTOLIN HFA) 108 (90 Base) MCG/ACT inhaler Inhale 1-2  puffs into the lungs every 6 (six) hours as needed for wheezing or shortness of breath.    [provider]  cholecalciferol (VITAMIN D3) 25 MCG (1000 UNIT) tablet Take 1,000 Units by mouth daily.    [provider]  gabapentin (NEURONTIN) 100 MG capsule Take 100 mg by mouth 3 (three) times daily as needed (for pain).  10/02/18   [provider]  ipratropium-albuterol (DUONEB) 0.5-2.5 (3) MG/3ML SOLN Take 3 mLs by nebulization in the morning, at noon, and at bedtime. 06/17/20   Johnson, Clanford L, MD  meloxicam (MOBIC) 7.5 MG tablet Take 7.5 mg by mouth daily.    [provider]  Multiple Vitamin (MULTIVITAMIN WITH MINERALS) TABS tablet Take 1 tablet by mouth daily.    [provider]  omeprazole (PRILOSEC) 20 MG capsule Take 1 capsule (20 mg total) by mouth 2 (two) times daily for 14 days. 06/17/20 07/01/20  Johnson, Clanford L, MD  OXYGEN Inhale 3 L into the lungs daily as needed (FOR SHORTNESS OF BREATH).     [provider]  SYMBICORT 80-4.5 MCG/ACT inhaler Inhale into the lungs. 08/19/20   [provider]    Allergies    Patient has no known allergies.  Review of Systems   Review  of Systems  Constitutional:  Negative for fever.  Gastrointestinal:  Positive for abdominal pain. Negative for nausea and vomiting.  Genitourinary:  Positive for flank pain. Negative for dysuria and hematuria.   Physical Exam Updated Vital Signs BP (!) 153/98 (BP Location: Right Arm)   Pulse 86   Temp 98.3 F (36.8 C) (Oral)   Resp 18   Ht '5\' 6"'$  (1.676 m)   Wt 87.1 kg   SpO2 92%   BMI 30.99 kg/m   Physical Exam Vitals and nursing note reviewed. Exam conducted with a chaperone present.  Constitutional:      Appearance: Normal appearance.  HENT:     Head: Normocephalic and atraumatic.  Eyes:     General: No scleral icterus.       Right eye: No discharge.        Left eye: No discharge.     Extraocular Movements: Extraocular movements  intact.     Pupils: Pupils are equal, round, and reactive to light.  Cardiovascular:     Rate and Rhythm: Normal rate and regular rhythm.     Pulses: Normal pulses.     Heart sounds: Normal heart sounds. No murmur heard.   No friction rub. No gallop.  Pulmonary:     Effort: Pulmonary effort is normal. No respiratory distress.     Breath sounds: Normal breath sounds.  Abdominal:     General: Abdomen is flat. Bowel sounds are normal. There is no distension.     Palpations: Abdomen is soft.     Tenderness: There is abdominal tenderness. There is left CVA tenderness. There is no right CVA tenderness or guarding.  Skin:    General: Skin is warm and dry.     Coloration: Skin is not jaundiced.  Neurological:     Mental Status: She is alert. Mental status is at baseline.     Coordination: Coordination normal.   ED Results / Procedures / Treatments   Labs (all labs ordered are listed, but only abnormal results are displayed) Labs Reviewed  CBC WITH DIFFERENTIAL/PLATELET - Abnormal; Notable for the following components:      Result Value   WBC 14.2 (*)    Hemoglobin 16.5 (*)    HCT 49.9 (*)    MCV 102.9 (*)    All other components within normal limits  URINALYSIS, ROUTINE W REFLEX MICROSCOPIC  COMPREHENSIVE METABOLIC PANEL  LIPASE, BLOOD    EKG None  Radiology No results found.  Procedures Procedures   Medications Ordered in ED Medications  ondansetron (ZOFRAN) injection 4 mg (has no administration in time range)  morphine 4 MG/ML injection 4 mg (has no administration in time range)    ED Course  I have reviewed the triage vital signs and the nursing notes.  Pertinent labs & imaging results that were available during my care of the patient were reviewed by me and considered in my medical decision making (see chart for details).  Clinical Course as of 06/22/21 1850  Thu Jun 22, 2021  1318 WBC(!): 14.2 Mild leukocytosis without a left shift. [HS]  1318 Lipase,  blood Doubt pancreatitis [HS]  1318 Comprehensive metabolic panel(!) No electrolyte derangement, no elevated LFTs concerning for biliary process or hepatitis, no AKI [HS]  1319 Urinalysis, Routine w reflex microscopic Urine, Clean Catch No urinary tract infection, no frank blood concerning for kidney stone. [HS]    Clinical Course User Index [HS] Sherrill Raring, PA-C   MDM Rules/Calculators/A&P  Differential includes cystitis, kidney stone, UTI, muscle strain.  Patient is resting comfortably, pain is improved.  Please see ED course for dictation of labs and imaging.  CTA did not show any signs of kidney stone.  Patient is resting comfortably, she is mildly hypertensive but her vitals are otherwise stable.  Pain has improved, I do not really suspect any signs of emergent pathology.  I think the patient is appropriate for discharge at this time with meloxicam.  I suspect this is muscle strain.  Follow-up and return precautions were discussed and agreed upon.  Final Clinical Impression(s) / ED Diagnoses Final diagnoses:  None    Rx / DC Orders ED Discharge Orders     None        Sherrill Raring, Hershal Coria 06/22/21 Yevette Edwards    Milton Ferguson, MD 06/26/21 [phone]

## 2021-07-17 ENCOUNTER — Other Ambulatory Visit: Payer: Self-pay

## 2021-07-17 ENCOUNTER — Encounter: Payer: Self-pay | Admitting: Pulmonary Disease

## 2021-07-17 ENCOUNTER — Ambulatory Visit (INDEPENDENT_AMBULATORY_CARE_PROVIDER_SITE_OTHER): Payer: Medicaid Other | Admitting: Pulmonary Disease

## 2021-07-17 VITALS — BP 136/86 | HR 98 | Temp 98.4°F | Ht 66.0 in | Wt 196.0 lb

## 2021-07-17 DIAGNOSIS — J449 Chronic obstructive pulmonary disease, unspecified: Secondary | ICD-10-CM | POA: Diagnosis not present

## 2021-07-17 DIAGNOSIS — G4734 Idiopathic sleep related nonobstructive alveolar hypoventilation: Secondary | ICD-10-CM

## 2021-07-17 MED ORDER — IPRATROPIUM-ALBUTEROL 0.5-2.5 (3) MG/3ML IN SOLN
3.0000 mL | Freq: Three times a day (TID) | RESPIRATORY_TRACT | 5 refills | Status: DC
Start: 2021-07-17 — End: 2022-11-04

## 2021-07-17 MED ORDER — SYMBICORT 80-4.5 MCG/ACT IN AERO: 2.0000 | INHALATION_SPRAY | Freq: Two times a day (BID) | RESPIRATORY_TRACT | 5 refills | Status: DC

## 2021-07-17 MED ORDER — ALBUTEROL SULFATE HFA 108 (90 BASE) MCG/ACT IN AERS
1.0000 | INHALATION_SPRAY | Freq: Four times a day (QID) | RESPIRATORY_TRACT | 5 refills | Status: AC | PRN
Start: 2021-07-17 — End: ?

## 2021-07-17 MED ORDER — BREZTRI AEROSPHERE 160-9-4.8 MCG/ACT IN AERO: 2.0000 | INHALATION_SPRAY | Freq: Two times a day (BID) | RESPIRATORY_TRACT | 0 refills | Status: DC

## 2021-07-17 NOTE — Patient Instructions (Signed)
Will check on status of getting PFT done at [facility]  Will have you try sample of breztri until you can get your other inhaler medications  Refills for symbicort, albuterol, and nebulizer medications sent  Follow up in 6 months

## 2021-07-17 NOTE — Addendum Note (Signed)
Addended by: Elby Beck R on: 07/17/2021 12:36 PM   Modules accepted: Orders

## 2021-07-17 NOTE — Progress Notes (Signed)
[facility]  Chief Complaint  Patient presents with   Follow-up    Patient is out of both inhalers, still wears oxygen as needed. Needs nebulizer supplies.     Constitutional:  BP 136/86 (BP Location: Right Arm, Patient Position: Sitting, Cuff Size: Normal)   Pulse 98   Temp 98.4 F (36.9 C) (Oral)   Ht '5\' 6"'$  (1.676 m)   Wt 196 lb (88.9 kg)   SpO2 94%   BMI 31.64 kg/m   Past Medical History:  Polycythemia, Allergies, Vit D deficiency, Influenza B February 2019  Past Surgical History:  Her  has a past surgical history that includes Cesarean section.  Brief Summary:  Emily Fitzgerald is a 60 y.o. female smoker with COPD and chronic respiratory failure with hypoxia and hypercapnia.       Subjective:   She is having more trouble with allergies.  Down to 1/2 ppd.  Has cough with clear sputum.  Has trouble affording medicines.  Using oxygen at night.  Physical Exam:   Appearance - well kempt   ENMT - no sinus tenderness, no oral exudate, no LAN, Mallampati 3 airway, no stridor  Respiratory - equal breath sounds bilaterally, no wheezing or rales  CV - s1s2 regular rate and rhythm, no murmurs  Ext - no clubbing, no edema  Skin - no rashes  Psych - normal mood and affect    Pulmonary testing:  A1AT 09/09/20 >> 125, MM  Chest Imaging:    Sleep Tests:  ONO with RA 11/14/20 >> test time 10 hrs 40 min.  Baseline SpO2 82%, low SpO2 59%.  Spent 9 hrs 54 min with SpO2 < 88%.  Cardiac Tests:  Echo 09/15/20 >> EF 60 to 65%, mild LVH, grade 1 DD  Social History:  She  reports that she has been smoking cigarettes. She has a 18.50 pack-year smoking history. She has never used smokeless tobacco. She reports current alcohol use. She reports that she does not use drugs.  Family History:  Her family history includes CAD in her brother; Cancer in her mother; Leukemia in her father.    Assessment/Plan:   COPD with emphysema. -  sample of breztri given until she can afford other refills - continue symbicort - prn albuterol and duoneb - will check status of getting PFT done at [facility]  Chronic respiratory failure with hypoxia and hypercapnia. - continue 3 liters O2 at night  Tobacco abuse. 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 Lindalou Hose with [facility] at [facility] polycythemia. - followed by Dr. Silvestre Mesi at [facility]   Time Spent Involved in Patient Care on Day of Examination:  22 minutes  Follow up:   Patient Instructions  Will check on status of getting PFT done at [facility]  Will have you try sample of breztri until you can get your other inhaler medications  Refills for symbicort, albuterol, and nebulizer medications sent  Follow up in 6 months  Medication List:   Allergies as of 07/17/2021   No Known Allergies      Medication List        Accurate as of July 17, 2021 11:49 AM. If you have any questions, ask your nurse or doctor.  albuterol 108 (90 Base) MCG/ACT inhaler Commonly known as: VENTOLIN HFA Inhale 1-2 puffs into the lungs every 6 (six) hours as needed for wheezing or shortness of breath.   cholecalciferol 25 MCG (1000 UNIT) tablet Commonly known as: VITAMIN D3 Take 1,000 Units by mouth daily.   gabapentin 100 MG capsule Commonly known as: NEURONTIN Take 100 mg by mouth 3 (three) times daily as needed (for pain).   ipratropium-albuterol 0.5-2.5 (3) MG/3ML Soln Commonly known as: DUONEB Take 3 mLs by nebulization in the morning, at noon, and at bedtime.   lidocaine 5 % Commonly known as: Lidoderm Place 1 patch onto the skin daily. Remove & Discard patch within 12 hours or as directed by MD   meloxicam 7.5 MG tablet Commonly known as: Mobic Take  1 tablet (7.5 mg total) by mouth daily.   multivitamin with minerals Tabs tablet Take 1 tablet by mouth daily.   omeprazole 20 MG capsule Commonly known as: PriLOSEC Take 1 capsule (20 mg total) by mouth 2 (two) times daily for 14 days.   OXYGEN Inhale 3 L into the lungs daily as needed (FOR SHORTNESS OF BREATH).   Symbicort 80-4.5 MCG/ACT inhaler Generic drug: budesonide-formoterol Inhale 2 puffs into the lungs 2 (two) times daily. What changed:  how much to take when to take this Changed by: Chesley Mires, MD        Signature:  Chesley Mires, MD [place] Pager - [phone] 07/17/2021, 11:49 AM

## 2021-08-31 ENCOUNTER — Encounter (HOSPITAL_COMMUNITY): Payer: Self-pay | Admitting: Emergency Medicine

## 2021-08-31 ENCOUNTER — Emergency Department (HOSPITAL_COMMUNITY): Payer: Medicaid Other

## 2021-08-31 ENCOUNTER — Emergency Department (HOSPITAL_COMMUNITY)
Admission: EM | Admit: 2021-08-31 | Discharge: 2021-08-31 | Disposition: A | Discharge status: Home / Self Care | Payer: Medicaid Other | Attending: Emergency Medicine | Admitting: Emergency Medicine

## 2021-08-31 ENCOUNTER — Other Ambulatory Visit: Payer: Self-pay

## 2021-08-31 DIAGNOSIS — M791 Myalgia, unspecified site: Secondary | ICD-10-CM | POA: Diagnosis not present

## 2021-08-31 DIAGNOSIS — Z7952 Long term (current) use of systemic steroids: Secondary | ICD-10-CM | POA: Diagnosis not present

## 2021-08-31 DIAGNOSIS — Z20822 Contact with and (suspected) exposure to covid-19: Secondary | ICD-10-CM | POA: Insufficient documentation

## 2021-08-31 DIAGNOSIS — J1089 Influenza due to other identified influenza virus with other manifestations: Secondary | ICD-10-CM | POA: Insufficient documentation

## 2021-08-31 DIAGNOSIS — F17210 Nicotine dependence, cigarettes, uncomplicated: Secondary | ICD-10-CM | POA: Insufficient documentation

## 2021-08-31 DIAGNOSIS — J441 Chronic obstructive pulmonary disease with (acute) exacerbation: Secondary | ICD-10-CM | POA: Diagnosis not present

## 2021-08-31 DIAGNOSIS — R059 Cough, unspecified: Secondary | ICD-10-CM | POA: Diagnosis present

## 2021-08-31 DIAGNOSIS — Z720 Tobacco use: Secondary | ICD-10-CM | POA: Insufficient documentation

## 2021-08-31 LAB — CBC WITH DIFFERENTIAL/PLATELET
Basophils Absolute: 0 10*3/uL (ref 0.0–0.1)
Basophils Relative: 0 %
Eosinophils Absolute: 0 10*3/uL (ref 0.0–0.5)
Eosinophils Relative: 0 %
HCT: 44.9 % (ref 36.0–46.0)
Hemoglobin: 14.8 g/dL (ref 12.0–15.0)
Lymphocytes Relative: 36 %
Lymphs Abs: 5.5 10*3/uL — ABNORMAL HIGH (ref 0.7–4.0)
MCH: 33 pg (ref 26.0–34.0)
MCHC: 33 g/dL (ref 30.0–36.0)
MCV: 100 fL (ref 80.0–100.0)
Monocytes Absolute: 0.8 10*3/uL (ref 0.1–1.0)
Monocytes Relative: 5 %
Neutro Abs: 9 10*3/uL — ABNORMAL HIGH (ref 1.7–7.7)
Neutrophils Relative %: 59 %
Platelets: 344 10*3/uL (ref 150–400)
RBC: 4.49 MIL/uL (ref 3.87–5.11)
RDW: 11.6 % (ref 11.5–15.5)
WBC: 15.3 10*3/uL — ABNORMAL HIGH (ref 4.0–10.5)
nRBC: 0 % (ref 0.0–0.2)

## 2021-08-31 LAB — BASIC METABOLIC PANEL
Anion gap: 9 (ref 5–15)
BUN: 8 mg/dL (ref 6–20)
CO2: 29 mmol/L (ref 22–32)
Calcium: 8.9 mg/dL (ref 8.9–10.3)
Chloride: 99 mmol/L (ref 98–111)
Creatinine, Ser: 0.53 mg/dL (ref 0.44–1.00)
GFR, Estimated: >60 mL/min (ref >60)
Glucose, Bld: 84 mg/dL (ref 70–99)
Potassium: 4 mmol/L (ref 3.5–5.1)
Sodium: 137 mmol/L (ref 135–145)

## 2021-08-31 LAB — RESP PANEL BY RT-PCR (FLU A&B, COVID) ARPGX2
Influenza A by PCR: NEGATIVE
Influenza B by PCR: NEGATIVE
SARS Coronavirus 2 by RT PCR: NEGATIVE

## 2021-08-31 MED ORDER — PREDNISONE 50 MG PO TABS
60.0000 mg | ORAL_TABLET | Freq: Once | ORAL | Status: AC
  Administered 2021-08-31: 60 mg via ORAL
  Filled 2021-08-31: qty 1

## 2021-08-31 MED ORDER — PREDNISONE 10 MG PO TABS: 40.0000 mg | ORAL_TABLET | Freq: Every day | ORAL | 0 refills | Status: DC

## 2021-08-31 MED ORDER — AMOXICILLIN-POT CLAVULANATE 875-125 MG PO TABS: 1.0000 | ORAL_TABLET | Freq: Two times a day (BID) | ORAL | 0 refills | Status: AC

## 2021-08-31 NOTE — Discharge Instructions (Addendum)
You were seen here today for 5 days.  Here lab and imaging work-up was negative.  You have been prescribed Augmentin and prednisone to help with your COPD exacerbation.  Please take as prescribed and finish the entirety of the course.  Please maintain drink plenty of fluids.  Please refrain from smoking.  If you have any concern, new, or worsening symptoms, please return to the nearest ED.

## 2021-08-31 NOTE — ED Provider Notes (Signed)
[facility] EMERGENCY DEPARTMENT Provider Note   CSN: [id number] Arrival date & time: 08/31/21  1452     History Chief Complaint  Patient presents with   Cough    Emily Fitzgerald is a 60 y.o. female smoker with h/o COPD presents to the emergency department for 5 days of nonproductive cough with body aches, rhinorrhea, nasal congestion, and mildly worsening shortness of breath.  Patient is usually on 3 L of O2 as needed with an average O2 sat of 91 to 92%.  Patient reports her stepson was recently diagnosed with pneumonia. She has tried over-the-counter NyQuil with no relief of symptoms.  She reports her symptoms worsen at night. She denies any chest pain, abdominal pain, nausea, vomiting, diaphoresis, sore throat, or earache.  No known drug allergies.  Patient is a daily smoker, but denies any EtOH or illicit drug use.   Cough Associated symptoms: myalgias, rhinorrhea and shortness of breath   Associated symptoms: no chest pain, no chills, no ear pain, no fever, no rash and no sore throat       Past Medical History:  Diagnosis Date   COPD (chronic obstructive pulmonary disease) ([place])    Influenza B 12/2017   Polycythemia    Seasonal allergies    Vitamin D deficiency     Patient Active Problem List   Diagnosis Date Noted   Transaminasemia    COPD with acute exacerbation ([place]) 01/08/2018   Community acquired pneumonia of left lower lobe of lung 01/04/2018   Acute respiratory failure with hypoxia ([place]) 01/04/2018   Elevated LFTs 01/04/2018    Past Surgical History:  Procedure Laterality Date   CESAREAN SECTION       OB History   No obstetric history on file.     Family History  Problem Relation Age of Onset   Cancer Mother    Leukemia Father    CAD Brother     Social History   Tobacco Use   Smoking status: Every Day    Packs/day: 0.50    Years: 37.00    Pack years: 18.50    Types: Cigarettes   Smokeless tobacco: Never   Tobacco comments:    Smokes 1/2 pack  per day 07/17/21 [facility]  Vaping Use   Vaping Use: Never used  Substance Use Topics   Alcohol use: Yes   Drug use: No    Home Medications Prior to Admission medications   Medication Sig Start Date End Date Taking? Authorizing Provider  acetaminophen (TYLENOL) 325 MG tablet Take 325 mg by mouth every 6 (six) hours as needed for moderate pain.   Yes [provider]  albuterol (VENTOLIN HFA) 108 (90 Base) MCG/ACT inhaler Inhale 1-2 puffs into the lungs every 6 (six) hours as needed for wheezing or shortness of breath. 07/17/21  Yes Chesley Mires, MD  amoxicillin-clavulanate (AUGMENTIN) 875-125 MG tablet Take 1 tablet by mouth every 12 (twelve) hours for 5 days. 08/31/21 09/05/21 Yes Sherrell Puller, PA-C  cyclobenzaprine (FLEXERIL) 10 MG tablet Take 10 mg by mouth daily as needed for muscle spasms. 07/07/21  Yes [provider]  gabapentin (NEURONTIN) 100 MG capsule Take 100 mg by mouth 3 (three) times daily as needed (for pain).  10/02/18  Yes [provider]  ipratropium-albuterol (DUONEB) 0.5-2.5 (3) MG/3ML SOLN Take 3 mLs by nebulization in the morning, at noon, and at bedtime. 07/17/21  Yes Chesley Mires, MD  OXYGEN Inhale 3 L into the lungs daily as needed (FOR SHORTNESS OF BREATH).  Yes [provider]  predniSONE (DELTASONE) 10 MG tablet Take 4 tablets (40 mg total) by mouth daily. 08/31/21  Yes Sherrell Puller, PA-C  SYMBICORT 80-4.5 MCG/ACT inhaler Inhale 2 puffs into the lungs 2 (two) times daily. 07/17/21  Yes Chesley Mires, MD  Budeson-Glycopyrrol-Formoterol (BREZTRI AEROSPHERE) 160-9-4.8 MCG/ACT AERO Inhale 2 puffs into the lungs 2 (two) times daily. Patient not taking: No sig reported 07/17/21   Chesley Mires, MD  cholecalciferol (VITAMIN D3) 25 MCG (1000 UNIT) tablet Take 1,000 Units by mouth daily. Patient not taking: No sig reported    [provider]  hydrOXYzine (ATARAX/VISTARIL) 25 MG tablet 1 tablet at bedtime as needed Patient not taking: No sig  reported 07/07/21   [provider]  lidocaine (LIDODERM) 5 % Place 1 patch onto the skin daily. Remove & Discard patch within 12 hours or as directed by MD Patient not taking: No sig reported 06/22/21   Sherrill Raring, PA-C  meloxicam (MOBIC) 7.5 MG tablet Take 1 tablet (7.5 mg total) by mouth daily. Patient not taking: No sig reported 06/22/21   Sherrill Raring, PA-C  meloxicam (MOBIC) 7.5 MG tablet 1 tablet Patient not taking: No sig reported 09/01/20   [provider]  Multiple Vitamin (MULTIVITAMIN WITH MINERALS) TABS tablet Take 1 tablet by mouth daily. Patient not taking: No sig reported    [provider]  omeprazole (PRILOSEC) 20 MG capsule Take 1 capsule (20 mg total) by mouth 2 (two) times daily for 14 days. Patient not taking: Reported on 08/31/2021 06/17/20 07/01/20  Murlean Iba, MD  triamcinolone ointment (KENALOG) 0.5 % 1 application Patient not taking: No sig reported 07/07/21   [provider]    Allergies    Patient has no known allergies.  Review of Systems   Review of Systems  Constitutional:  Positive for fatigue. Negative for chills and fever.  HENT:  Positive for congestion and rhinorrhea. Negative for ear pain and sore throat.   Eyes:  Negative for pain and visual disturbance.  Respiratory:  Positive for cough and shortness of breath.   Cardiovascular:  Negative for chest pain and palpitations.  Gastrointestinal:  Negative for abdominal pain, constipation, diarrhea, nausea and vomiting.  Genitourinary:  Negative for dysuria and hematuria.  Musculoskeletal:  Positive for myalgias. Negative for arthralgias, back pain and neck pain.  Skin:  Negative for color change and rash.  Neurological:  Negative for seizures and syncope.  All other systems reviewed and are negative.  Physical Exam Updated Vital Signs BP 126/80   Pulse 79   Temp 98.9 F (37.2 C) (Oral)   Resp (!) 21   Ht 5\' 6"  (1.676 m)   Wt 87.5 kg   SpO2 91%   BMI 31.15  kg/m   Physical Exam Vitals and nursing note reviewed.  Constitutional:      General: She is not in acute distress.    Appearance: Normal appearance. She is not toxic-appearing or diaphoretic.  HENT:     Head: Normocephalic and atraumatic.     Right Ear: Tympanic membrane normal.     Left Ear: Tympanic membrane normal.     Nose: Congestion present.     Mouth/Throat:     Pharynx: No oropharyngeal exudate or posterior oropharyngeal erythema.  Eyes:     General: No scleral icterus. Cardiovascular:     Rate and Rhythm: Normal rate and regular rhythm.  Pulmonary:     Effort: Pulmonary effort is normal. No respiratory distress.  Comments: Coarse expiratory wheezing on bilateral lower lobes.  Fine inspiratory wheezing heard throughout.  No respiratory distress, tripoding, nasal flaring, accessory muscle use, or cyanosis noted.  Patient speaking in full sentences with ease.  In the room she is on her at home oxygen requirement satting 92%. Abdominal:     General: Abdomen is flat. Bowel sounds are normal.     Palpations: Abdomen is soft.     Tenderness: There is no abdominal tenderness. There is no guarding or rebound.  Musculoskeletal:        General: No deformity.     Cervical back: Normal range of motion.  Skin:    General: Skin is warm and dry.  Neurological:     General: No focal deficit present.     Mental Status: She is alert. Mental status is at baseline.    ED Results / Procedures / Treatments   Labs (all labs ordered are listed, but only abnormal results are displayed) Labs Reviewed  CBC WITH DIFFERENTIAL/PLATELET - Abnormal; Notable for the following components:      Result Value   WBC 15.3 (*)    Neutro Abs 9.0 (*)    Lymphs Abs 5.5 (*)    All other components within normal limits  RESP PANEL BY RT-PCR (FLU A&B, COVID) ARPGX2  BASIC METABOLIC PANEL    EKG None  Radiology DG Chest 2 View  Result Date: 08/31/2021 CLINICAL DATA:  Cough and shortness of  breath. EXAM: CHEST - 2 VIEW COMPARISON:  Chest x-ray 10/09/2020. FINDINGS: There is linear atelectasis or scarring in the left lung base. There is no focal lung consolidation, pleural effusion or pneumothorax. Cardiomediastinal silhouette is within normal limits. No acute fractures are seen. IMPRESSION: No active cardiopulmonary disease. Electronically Signed   By: Ronney Asters M.D.   On: 08/31/2021 18:28    Procedures Procedures   Medications Ordered in ED Medications  predniSONE (DELTASONE) tablet 60 mg (has no administration in time range)    ED Course  I have reviewed the triage vital signs and the nursing notes.  Pertinent labs & imaging results that were available during my care of the patient were reviewed by me and considered in my medical decision making (see chart for details).  Emily Fitzgerald is a 60 year old female with history of COPD presenting for 5-day history of cough, myalgia, rhinorrhea, nasal congestion, shortness of breath.  Differential diagnosis includes but is not limited to pneumonia, pneumothorax, COPD exacerbation, COVID, flu, viral illness.  I personally reviewed and interpreted the patient's labs and imaging.  The patient is negative for COVID and flu.  Chest x-ray shows no evidence of pneumonia or pneumothorax. No focal lung consolidation.  No evidence of acute cardiopulmonary process.  BNP within normal limits.  CBC shows mild leukocytosis of 15.3.  No anemia.  Based on patient's presentation, she will be treated for a COPD exacerbation.  I discussed the lab and imaging findings with patient.  She will prescribed Augmentin and prednisone to go home with.  Discussed with her the importance of compliance with these medications and to take them as prescribed and finish entire course.  Return precautions discussed including chest pain, worsening shortness of breath, and/or fevers.  Patient agrees to plan.  Patient is stable being discharged home in good condition.  I  discussed this case with my attending physician who cosigned this note including patient's presenting symptoms, physical exam, and planned diagnostics and interventions. Attending physician stated agreement with plan or made changes to  plan which were implemented.   MDM Rules/Calculators/A&P                          Final Clinical Impression(s) / ED Diagnoses Final diagnoses:  COPD exacerbation ([place])  Tobacco use    Rx / DC Orders ED Discharge Orders          Ordered    amoxicillin-clavulanate (AUGMENTIN) 875-125 MG tablet  Every 12 hours        08/31/21 1856    predniSONE (DELTASONE) 10 MG tablet  Daily        08/31/21 1856             Sherrell Puller, PA-C 08/31/21 1904    Sherwood Gambler, MD 09/05/21 [phone]

## 2021-08-31 NOTE — ED Triage Notes (Signed)
Pt c/o cough x5 days. Pt states shes tried OTC meds with no relief.

## 2021-11-13 ENCOUNTER — Other Ambulatory Visit: Payer: Self-pay

## 2021-11-13 ENCOUNTER — Other Ambulatory Visit (HOSPITAL_COMMUNITY): Payer: Self-pay

## 2021-11-13 ENCOUNTER — Inpatient Hospital Stay (HOSPITAL_COMMUNITY)
Admission: EM | Admit: 2021-11-13 | Discharge: 2021-11-20 | DRG: 190 | Disposition: A | Discharge status: Home / Self Care | Payer: Medicaid Other | Attending: Family Medicine | Admitting: Family Medicine

## 2021-11-13 ENCOUNTER — Emergency Department (HOSPITAL_COMMUNITY): Payer: Medicaid Other

## 2021-11-13 ENCOUNTER — Encounter (HOSPITAL_COMMUNITY): Payer: Self-pay | Admitting: Emergency Medicine

## 2021-11-13 DIAGNOSIS — Z806 Family history of leukemia: Secondary | ICD-10-CM

## 2021-11-13 DIAGNOSIS — Z7951 Long term (current) use of inhaled steroids: Secondary | ICD-10-CM

## 2021-11-13 DIAGNOSIS — Z91041 Radiographic dye allergy status: Secondary | ICD-10-CM

## 2021-11-13 DIAGNOSIS — F1020 Alcohol dependence, uncomplicated: Secondary | ICD-10-CM | POA: Diagnosis present

## 2021-11-13 DIAGNOSIS — Z720 Tobacco use: Secondary | ICD-10-CM

## 2021-11-13 DIAGNOSIS — F17210 Nicotine dependence, cigarettes, uncomplicated: Secondary | ICD-10-CM | POA: Diagnosis present

## 2021-11-13 DIAGNOSIS — D72829 Elevated white blood cell count, unspecified: Secondary | ICD-10-CM

## 2021-11-13 DIAGNOSIS — R718 Other abnormality of red blood cells: Secondary | ICD-10-CM

## 2021-11-13 DIAGNOSIS — R739 Hyperglycemia, unspecified: Secondary | ICD-10-CM | POA: Diagnosis present

## 2021-11-13 DIAGNOSIS — Z20822 Contact with and (suspected) exposure to covid-19: Secondary | ICD-10-CM | POA: Diagnosis present

## 2021-11-13 DIAGNOSIS — Z7952 Long term (current) use of systemic steroids: Secondary | ICD-10-CM

## 2021-11-13 DIAGNOSIS — K219 Gastro-esophageal reflux disease without esophagitis: Secondary | ICD-10-CM

## 2021-11-13 DIAGNOSIS — I10 Essential (primary) hypertension: Secondary | ICD-10-CM | POA: Diagnosis present

## 2021-11-13 DIAGNOSIS — E669 Obesity, unspecified: Secondary | ICD-10-CM | POA: Diagnosis present

## 2021-11-13 DIAGNOSIS — J9601 Acute respiratory failure with hypoxia: Secondary | ICD-10-CM

## 2021-11-13 DIAGNOSIS — J302 Other seasonal allergic rhinitis: Secondary | ICD-10-CM | POA: Diagnosis present

## 2021-11-13 DIAGNOSIS — Z8249 Family history of ischemic heart disease and other diseases of the circulatory system: Secondary | ICD-10-CM

## 2021-11-13 DIAGNOSIS — J441 Chronic obstructive pulmonary disease with (acute) exacerbation: Principal | ICD-10-CM | POA: Diagnosis present

## 2021-11-13 DIAGNOSIS — D72820 Lymphocytosis (symptomatic): Secondary | ICD-10-CM

## 2021-11-13 DIAGNOSIS — Z9981 Dependence on supplemental oxygen: Secondary | ICD-10-CM

## 2021-11-13 DIAGNOSIS — J9621 Acute and chronic respiratory failure with hypoxia: Secondary | ICD-10-CM

## 2021-11-13 DIAGNOSIS — E538 Deficiency of other specified B group vitamins: Secondary | ICD-10-CM | POA: Diagnosis present

## 2021-11-13 DIAGNOSIS — J449 Chronic obstructive pulmonary disease, unspecified: Secondary | ICD-10-CM | POA: Diagnosis present

## 2021-11-13 DIAGNOSIS — E66811 Obesity, class 1: Secondary | ICD-10-CM

## 2021-11-13 DIAGNOSIS — Z6832 Body mass index (BMI) 32.0-32.9, adult: Secondary | ICD-10-CM

## 2021-11-13 DIAGNOSIS — Z79899 Other long term (current) drug therapy: Secondary | ICD-10-CM

## 2021-11-13 LAB — CBC WITH DIFFERENTIAL/PLATELET
Basophils Absolute: 0 10*3/uL (ref 0.0–0.1)
Basophils Relative: 0 %
Eosinophils Absolute: 0 10*3/uL (ref 0.0–0.5)
Eosinophils Relative: 0 %
HCT: 48.1 % — ABNORMAL HIGH (ref 36.0–46.0)
Hemoglobin: 15.9 g/dL — ABNORMAL HIGH (ref 12.0–15.0)
Lymphocytes Relative: 62 %
Lymphs Abs: 18.5 10*3/uL — ABNORMAL HIGH (ref 0.7–4.0)
MCH: 33.6 pg (ref 26.0–34.0)
MCHC: 33.1 g/dL (ref 30.0–36.0)
MCV: 101.7 fL — ABNORMAL HIGH (ref 80.0–100.0)
Monocytes Absolute: 1.5 10*3/uL — ABNORMAL HIGH (ref 0.1–1.0)
Monocytes Relative: 5 %
Neutro Abs: 9.8 10*3/uL — ABNORMAL HIGH (ref 1.7–7.7)
Neutrophils Relative %: 33 %
Platelets: 374 10*3/uL (ref 150–400)
RBC: 4.73 MIL/uL (ref 3.87–5.11)
RDW: 11.8 % (ref 11.5–15.5)
WBC Morphology: ABNORMAL
WBC: 29.8 10*3/uL — ABNORMAL HIGH (ref 4.0–10.5)
nRBC: 0.1 % (ref 0.0–0.2)

## 2021-11-13 LAB — COMPREHENSIVE METABOLIC PANEL
ALT: 26 U/L (ref 0–44)
AST: 27 U/L (ref 15–41)
Albumin: 3.1 g/dL — ABNORMAL LOW (ref 3.5–5.0)
Alkaline Phosphatase: 101 U/L (ref 38–126)
Anion gap: 11 (ref 5–15)
BUN: 17 mg/dL (ref 6–20)
CO2: 31 mmol/L (ref 22–32)
Calcium: 9.2 mg/dL (ref 8.9–10.3)
Chloride: 95 mmol/L — ABNORMAL LOW (ref 98–111)
Creatinine, Ser: 0.6 mg/dL (ref 0.44–1.00)
GFR, Estimated: >60 mL/min (ref >60)
Glucose, Bld: 121 mg/dL — ABNORMAL HIGH (ref 70–99)
Potassium: 4.2 mmol/L (ref 3.5–5.1)
Sodium: 137 mmol/L (ref 135–145)
Total Bilirubin: 0.4 mg/dL (ref 0.3–1.2)
Total Protein: 7.7 g/dL (ref 6.5–8.1)

## 2021-11-13 LAB — RESP PANEL BY RT-PCR (FLU A&B, COVID) ARPGX2
Influenza A by PCR: NEGATIVE
Influenza B by PCR: NEGATIVE
SARS Coronavirus 2 by RT PCR: NEGATIVE

## 2021-11-13 MED ORDER — ALBUTEROL SULFATE HFA 108 (90 BASE) MCG/ACT IN AERS: 2.0000 | INHALATION_SPRAY | RESPIRATORY_TRACT | Status: DC | PRN

## 2021-11-13 MED ORDER — SODIUM CHLORIDE 0.9 % IV SOLN
500.0000 mg | INTRAVENOUS | Status: DC
  Administered 2021-11-14 – 2021-11-16 (×4): 500 mg via INTRAVENOUS
  Filled 2021-11-13 (×3): qty 5

## 2021-11-13 MED ORDER — SODIUM CHLORIDE 0.9 % IV SOLN
2.0000 g | INTRAVENOUS | Status: AC
  Administered 2021-11-13 – 2021-11-18 (×5): 2 g via INTRAVENOUS
  Filled 2021-11-13 (×4): qty 20

## 2021-11-13 MED ORDER — SODIUM CHLORIDE 0.9 % IV BOLUS
1000.0000 mL | Freq: Once | INTRAVENOUS | Status: AC
  Administered 2021-11-13: 23:00:00 1000 mL via INTRAVENOUS

## 2021-11-13 MED ORDER — IPRATROPIUM-ALBUTEROL 0.5-2.5 (3) MG/3ML IN SOLN
3.0000 mL | Freq: Once | RESPIRATORY_TRACT | Status: AC
  Administered 2021-11-13: 23:00:00 3 mL via RESPIRATORY_TRACT
  Filled 2021-11-13: qty 3

## 2021-11-13 MED ORDER — ALBUTEROL SULFATE (2.5 MG/3ML) 0.083% IN NEBU
2.5000 mg | INHALATION_SOLUTION | Freq: Once | RESPIRATORY_TRACT | Status: AC
  Administered 2021-11-14: 2.5 mg via RESPIRATORY_TRACT
  Filled 2021-11-13: qty 3

## 2021-11-13 MED ORDER — IPRATROPIUM-ALBUTEROL 0.5-2.5 (3) MG/3ML IN SOLN
3.0000 mL | Freq: Once | RESPIRATORY_TRACT | Status: AC
  Administered 2021-11-14: 3 mL via RESPIRATORY_TRACT
  Filled 2021-11-13: qty 3

## 2021-11-13 MED ORDER — METHYLPREDNISOLONE SODIUM SUCC 125 MG IJ SOLR
125.0000 mg | Freq: Once | INTRAMUSCULAR | Status: AC
  Administered 2021-11-13: 125 mg via INTRAVENOUS
  Filled 2021-11-13: qty 2

## 2021-11-13 MED ORDER — ALBUTEROL SULFATE (2.5 MG/3ML) 0.083% IN NEBU
2.5000 mg | INHALATION_SOLUTION | Freq: Once | RESPIRATORY_TRACT | Status: AC
  Administered 2021-11-13: 23:00:00 2.5 mg via RESPIRATORY_TRACT
  Filled 2021-11-13: qty 3

## 2021-11-13 MED ORDER — MAGNESIUM SULFATE 2 GM/50ML IV SOLN
2.0000 g | Freq: Once | INTRAVENOUS | Status: AC
  Administered 2021-11-14: 01:00:00 2 g via INTRAVENOUS
  Filled 2021-11-13: qty 50

## 2021-11-13 MED ORDER — SODIUM CHLORIDE 0.9 % IV BOLUS
2000.0000 mL | Freq: Once | INTRAVENOUS | Status: AC
  Administered 2021-11-13: 2000 mL via INTRAVENOUS

## 2021-11-13 NOTE — ED Provider Notes (Signed)
[facility] EMERGENCY DEPARTMENT Provider Note   CSN: [id number] Arrival date & time: 11/13/21  1932     History Chief Complaint  Patient presents with   Shortness of Breath    Emily Fitzgerald is a 60 y.o. female.  Has a history of COPD.  Patient complains of cough and shortness of breath for couple days with some yellow sputum production.  Patient is normally on 3 L of oxygen  The history is provided by the patient and medical records. No language interpreter was used.  Shortness of Breath Severity:  Moderate Onset quality:  Sudden Timing:  Constant Progression:  Worsening Chronicity:  New Context: activity   Relieved by:  Nothing Worsened by:  Nothing Ineffective treatments:  None tried Associated symptoms: wheezing   Associated symptoms: no abdominal pain, no chest pain, no cough, no headaches and no rash       Past Medical History:  Diagnosis Date   COPD (chronic obstructive pulmonary disease) ([place])    Influenza B 12/2017   Polycythemia    Seasonal allergies    Vitamin D deficiency     Patient Active Problem List   Diagnosis Date Noted   Transaminasemia    COPD with acute exacerbation ([place]) 01/08/2018   Community acquired pneumonia of left lower lobe of lung 01/04/2018   Acute respiratory failure with hypoxia ([place]) 01/04/2018   Elevated LFTs 01/04/2018    Past Surgical History:  Procedure Laterality Date   CESAREAN SECTION       OB History   No obstetric history on file.     Family History  Problem Relation Age of Onset   Cancer Mother    Leukemia Father    CAD Brother     Social History   Tobacco Use   Smoking status: Every Day    Packs/day: 0.50    Years: 37.00    Pack years: 18.50    Types: Cigarettes   Smokeless tobacco: Never   Tobacco comments:    Smokes 1/2 pack per day 07/17/21 [facility]  Vaping Use   Vaping Use: Never used  Substance Use Topics   Alcohol use: Yes   Drug use: No    Home Medications Prior to Admission  medications   Medication Sig Start Date End Date Taking? Authorizing Provider  acetaminophen (TYLENOL) 325 MG tablet Take 325 mg by mouth every 6 (six) hours as needed for moderate pain.    [provider]  albuterol (VENTOLIN HFA) 108 (90 Base) MCG/ACT inhaler Inhale 1-2 puffs into the lungs every 6 (six) hours as needed for wheezing or shortness of breath. 07/17/21   Chesley Mires, MD  Budeson-Glycopyrrol-Formoterol (BREZTRI AEROSPHERE) 160-9-4.8 MCG/ACT AERO Inhale 2 puffs into the lungs 2 (two) times daily. Patient not taking: No sig reported 07/17/21   Chesley Mires, MD  cholecalciferol (VITAMIN D3) 25 MCG (1000 UNIT) tablet Take 1,000 Units by mouth daily. Patient not taking: No sig reported    [provider]  cyclobenzaprine (FLEXERIL) 10 MG tablet Take 10 mg by mouth daily as needed for muscle spasms. 07/07/21   [provider]  gabapentin (NEURONTIN) 100 MG capsule Take 100 mg by mouth 3 (three) times daily as needed (for pain).  10/02/18   [provider]  hydrOXYzine (ATARAX/VISTARIL) 25 MG tablet 1 tablet at bedtime as needed Patient not taking: No sig reported 07/07/21   [provider]  ipratropium-albuterol (DUONEB) 0.5-2.5 (3) MG/3ML SOLN Take 3 mLs by nebulization in the morning, at noon, and  at bedtime. 07/17/21   Chesley Mires, MD  lidocaine (LIDODERM) 5 % Place 1 patch onto the skin daily. Remove & Discard patch within 12 hours or as directed by MD Patient not taking: No sig reported 06/22/21   Sherrill Raring, PA-C  meloxicam (MOBIC) 7.5 MG tablet Take 1 tablet (7.5 mg total) by mouth daily. Patient not taking: No sig reported 06/22/21   Sherrill Raring, PA-C  meloxicam (MOBIC) 7.5 MG tablet 1 tablet Patient not taking: No sig reported 09/01/20   [provider]  Multiple Vitamin (MULTIVITAMIN WITH MINERALS) TABS tablet Take 1 tablet by mouth daily. Patient not taking: No sig reported    [provider]  omeprazole (PRILOSEC) 20 MG  capsule Take 1 capsule (20 mg total) by mouth 2 (two) times daily for 14 days. Patient not taking: Reported on 08/31/2021 06/17/20 07/01/20  Irwin Brakeman L, MD  OXYGEN Inhale 3 L into the lungs daily as needed (FOR SHORTNESS OF BREATH).     [provider]  predniSONE (DELTASONE) 10 MG tablet Take 4 tablets (40 mg total) by mouth daily. 08/31/21   Sherrell Puller, PA-C  SYMBICORT 80-4.5 MCG/ACT inhaler Inhale 2 puffs into the lungs 2 (two) times daily. 07/17/21   Chesley Mires, MD  triamcinolone ointment (KENALOG) 0.5 % 1 application Patient not taking: No sig reported 07/07/21   [provider]    Allergies    Patient has no known allergies.  Review of Systems   Review of Systems  Constitutional:  Negative for appetite change and fatigue.  HENT:  Negative for congestion, ear discharge and sinus pressure.   Eyes:  Negative for discharge.  Respiratory:  Positive for shortness of breath and wheezing. Negative for cough.   Cardiovascular:  Negative for chest pain.  Gastrointestinal:  Negative for abdominal pain and diarrhea.  Genitourinary:  Negative for frequency and hematuria.  Musculoskeletal:  Negative for back pain.  Skin:  Negative for rash.  Neurological:  Negative for seizures and headaches.  Psychiatric/Behavioral:  Negative for hallucinations.    Physical Exam Updated Vital Signs BP (!) 155/86    Pulse 99    Temp 98.8 F (37.1 C) (Oral)    Resp 18    Ht 5\' 6"  (1.676 m)    Wt 91.6 kg    SpO2 97%    BMI 32.60 kg/m   Physical Exam Vitals and nursing note reviewed.  Constitutional:      Appearance: She is well-developed.  HENT:     Head: Normocephalic.     Nose: Nose normal.  Eyes:     General: No scleral icterus.    Conjunctiva/sclera: Conjunctivae normal.  Neck:     Thyroid: No thyromegaly.  Cardiovascular:     Rate and Rhythm: Normal rate and regular rhythm.     Heart sounds: No murmur heard.   No friction rub. No gallop.  Pulmonary:     Breath  sounds: No stridor. No wheezing or rales.  Chest:     Chest wall: No tenderness.  Abdominal:     General: There is no distension.     Tenderness: There is no abdominal tenderness. There is no rebound.  Musculoskeletal:        General: Normal range of motion.     Cervical back: Neck supple.  Lymphadenopathy:     Cervical: No cervical adenopathy.  Skin:    Findings: No erythema or rash.  Neurological:     Mental Status: She is alert and oriented to  person, place, and time.     Motor: No abnormal muscle tone.     Coordination: Coordination normal.  Psychiatric:        Behavior: Behavior normal.    ED Results / Procedures / Treatments   Labs (all labs ordered are listed, but only abnormal results are displayed) Labs Reviewed  CBC WITH DIFFERENTIAL/PLATELET - Abnormal; Notable for the following components:      Result Value   WBC 29.8 (*)    Hemoglobin 15.9 (*)    HCT 48.1 (*)    MCV 101.7 (*)    All other components within normal limits  COMPREHENSIVE METABOLIC PANEL - Abnormal; Notable for the following components:   Chloride 95 (*)    Glucose, Bld 121 (*)    Albumin 3.1 (*)    All other components within normal limits  RESP PANEL BY RT-PCR (FLU A&B, COVID) ARPGX2  CULTURE, BLOOD (SINGLE)  CULTURE, BLOOD (ROUTINE X 2)  CULTURE, BLOOD (ROUTINE X 2)  LACTIC ACID, PLASMA    EKG None  Radiology DG Chest 2 View  Result Date: 11/13/2021 CLINICAL DATA:  Cough EXAM: CHEST - 2 VIEW COMPARISON:  08/31/2021 FINDINGS: Lungs are well expanded, symmetric, and clear. No pneumothorax or pleural effusion. Cardiac size within normal limits. Pulmonary vascularity is normal. Osseous structures are age-appropriate. No acute bone abnormality. IMPRESSION: No active cardiopulmonary disease. Electronically Signed   By: Fidela Salisbury M.D.   On: 11/13/2021 21:26    Procedures Procedures   Medications Ordered in ED Medications  albuterol (VENTOLIN HFA) 108 (90 Base) MCG/ACT inhaler 2 puff  (has no administration in time range)  ipratropium-albuterol (DUONEB) 0.5-2.5 (3) MG/3ML nebulizer solution 3 mL (has no administration in time range)  albuterol (PROVENTIL) (2.5 MG/3ML) 0.083% nebulizer solution 2.5 mg (has no administration in time range)  cefTRIAXone (ROCEPHIN) 2 g in sodium chloride 0.9 % 100 mL IVPB (has no administration in time range)  azithromycin (ZITHROMAX) 500 mg in sodium chloride 0.9 % 250 mL IVPB (has no administration in time range)  sodium chloride 0.9 % bolus 2,000 mL (has no administration in time range)  methylPREDNISolone sodium succinate (SOLU-MEDROL) 125 mg/2 mL injection 125 mg (has no administration in time range)  magnesium sulfate IVPB 2 g 50 mL (has no administration in time range)  sodium chloride 0.9 % bolus 1,000 mL (1,000 mLs Intravenous New Bag/Given 11/13/21 2231)  ipratropium-albuterol (DUONEB) 0.5-2.5 (3) MG/3ML nebulizer solution 3 mL (3 mLs Nebulization Given 11/13/21 2248)  albuterol (PROVENTIL) (2.5 MG/3ML) 0.083% nebulizer solution 2.5 mg (2.5 mg Nebulization Given 11/13/21 2248)    ED Course  I have reviewed the triage vital signs and the nursing notes.  Pertinent labs & imaging results that were available during my care of the patient were reviewed by me and considered in my medical decision making (see chart for details).   CRITICAL CARE Performed by: Milton Ferguson Total critical care time: 40 minutes Critical care time was exclusive of separately billable procedures and treating other patients. Critical care was necessary to treat or prevent imminent or life-threatening deterioration. Critical care was time spent personally by me on the following activities: development of treatment plan with patient and/or surrogate as well as nursing, discussions with consultants, evaluation of patient's response to treatment, examination of patient, obtaining history from patient or surrogate, ordering and performing treatments and interventions,  ordering and review of laboratory studies, ordering and review of radiographic studies, pulse oximetry and re-evaluation of patient's condition.  MDM Rules/Calculators/A&P  Patient with COPD exacerbation and possible pneumonia.  Sepsis protocol was started.    Final Clinical Impression(s) / ED Diagnoses Final diagnoses:  None    Rx / DC Orders ED Discharge Orders     None        Milton Ferguson, MD 11/14/21 1129

## 2021-11-13 NOTE — ED Notes (Signed)
Pt placed on @ 3l [place] and sats up to 86%.

## 2021-11-13 NOTE — ED Triage Notes (Addendum)
Pt with flu-like symptoms ( productive cough, congestion, body aches,headache, and dizziness) Pt's O2 sats 73% on room air in triage. Pt wears O2 @ 3l at home d/t COPD.

## 2021-11-14 ENCOUNTER — Emergency Department (HOSPITAL_COMMUNITY): Payer: Medicaid Other

## 2021-11-14 DIAGNOSIS — E538 Deficiency of other specified B group vitamins: Secondary | ICD-10-CM | POA: Diagnosis present

## 2021-11-14 DIAGNOSIS — R718 Other abnormality of red blood cells: Secondary | ICD-10-CM

## 2021-11-14 DIAGNOSIS — J441 Chronic obstructive pulmonary disease with (acute) exacerbation: Secondary | ICD-10-CM | POA: Diagnosis present

## 2021-11-14 DIAGNOSIS — Z806 Family history of leukemia: Secondary | ICD-10-CM | POA: Diagnosis not present

## 2021-11-14 DIAGNOSIS — Z79899 Other long term (current) drug therapy: Secondary | ICD-10-CM | POA: Diagnosis not present

## 2021-11-14 DIAGNOSIS — Z20822 Contact with and (suspected) exposure to covid-19: Secondary | ICD-10-CM | POA: Diagnosis present

## 2021-11-14 DIAGNOSIS — Z720 Tobacco use: Secondary | ICD-10-CM

## 2021-11-14 DIAGNOSIS — E669 Obesity, unspecified: Secondary | ICD-10-CM | POA: Diagnosis present

## 2021-11-14 DIAGNOSIS — F1020 Alcohol dependence, uncomplicated: Secondary | ICD-10-CM | POA: Diagnosis present

## 2021-11-14 DIAGNOSIS — D72829 Elevated white blood cell count, unspecified: Secondary | ICD-10-CM | POA: Diagnosis not present

## 2021-11-14 DIAGNOSIS — R739 Hyperglycemia, unspecified: Secondary | ICD-10-CM | POA: Diagnosis present

## 2021-11-14 DIAGNOSIS — I10 Essential (primary) hypertension: Secondary | ICD-10-CM | POA: Diagnosis present

## 2021-11-14 DIAGNOSIS — Z91041 Radiographic dye allergy status: Secondary | ICD-10-CM | POA: Diagnosis not present

## 2021-11-14 DIAGNOSIS — J302 Other seasonal allergic rhinitis: Secondary | ICD-10-CM | POA: Diagnosis present

## 2021-11-14 DIAGNOSIS — Z7951 Long term (current) use of inhaled steroids: Secondary | ICD-10-CM | POA: Diagnosis not present

## 2021-11-14 DIAGNOSIS — Z6832 Body mass index (BMI) 32.0-32.9, adult: Secondary | ICD-10-CM | POA: Diagnosis not present

## 2021-11-14 DIAGNOSIS — J9621 Acute and chronic respiratory failure with hypoxia: Secondary | ICD-10-CM | POA: Diagnosis present

## 2021-11-14 DIAGNOSIS — D72820 Lymphocytosis (symptomatic): Secondary | ICD-10-CM

## 2021-11-14 DIAGNOSIS — Z8249 Family history of ischemic heart disease and other diseases of the circulatory system: Secondary | ICD-10-CM | POA: Diagnosis not present

## 2021-11-14 DIAGNOSIS — K219 Gastro-esophageal reflux disease without esophagitis: Secondary | ICD-10-CM

## 2021-11-14 DIAGNOSIS — F17210 Nicotine dependence, cigarettes, uncomplicated: Secondary | ICD-10-CM | POA: Diagnosis present

## 2021-11-14 DIAGNOSIS — Z7952 Long term (current) use of systemic steroids: Secondary | ICD-10-CM | POA: Diagnosis not present

## 2021-11-14 DIAGNOSIS — Z9981 Dependence on supplemental oxygen: Secondary | ICD-10-CM | POA: Diagnosis not present

## 2021-11-14 LAB — BLOOD GAS, ARTERIAL
Acid-Base Excess: 3.5 mmol/L — ABNORMAL HIGH (ref 0.0–2.0)
Bicarbonate: 25.6 mmol/L (ref 20.0–28.0)
Drawn by: 38235
FIO2: 44
O2 Saturation: 90.3 %
Patient temperature: 37
pCO2 arterial: 60.2 mmHg — ABNORMAL HIGH (ref 32.0–48.0)
pH, Arterial: 7.308 — ABNORMAL LOW (ref 7.350–7.450)
pO2, Arterial: 69.2 mmHg — ABNORMAL LOW (ref 83.0–108.0)

## 2021-11-14 LAB — COMPREHENSIVE METABOLIC PANEL
ALT: 25 U/L (ref 0–44)
AST: 21 U/L (ref 15–41)
Albumin: 2.8 g/dL — ABNORMAL LOW (ref 3.5–5.0)
Alkaline Phosphatase: 90 U/L (ref 38–126)
Anion gap: 8 (ref 5–15)
BUN: 12 mg/dL (ref 6–20)
CO2: 29 mmol/L (ref 22–32)
Calcium: 8.5 mg/dL — ABNORMAL LOW (ref 8.9–10.3)
Chloride: 100 mmol/L (ref 98–111)
Creatinine, Ser: 0.47 mg/dL (ref 0.44–1.00)
GFR, Estimated: >60 mL/min (ref >60)
Glucose, Bld: 171 mg/dL — ABNORMAL HIGH (ref 70–99)
Potassium: 4.4 mmol/L (ref 3.5–5.1)
Sodium: 137 mmol/L (ref 135–145)
Total Bilirubin: 0.1 mg/dL — ABNORMAL LOW (ref 0.3–1.2)
Total Protein: 6.9 g/dL (ref 6.5–8.1)

## 2021-11-14 LAB — EXPECTORATED SPUTUM ASSESSMENT W GRAM STAIN, RFLX TO RESP C: Special Requests: NORMAL

## 2021-11-14 LAB — CBC
HCT: 43.3 % (ref 36.0–46.0)
Hemoglobin: 13.9 g/dL (ref 12.0–15.0)
MCH: 32.9 pg (ref 26.0–34.0)
MCHC: 32.1 g/dL (ref 30.0–36.0)
MCV: 102.4 fL — ABNORMAL HIGH (ref 80.0–100.0)
Platelets: 276 10*3/uL (ref 150–400)
RBC: 4.23 MIL/uL (ref 3.87–5.11)
RDW: 12 % (ref 11.5–15.5)
WBC: 21.2 10*3/uL — ABNORMAL HIGH (ref 4.0–10.5)
nRBC: 0 % (ref 0.0–0.2)

## 2021-11-14 LAB — PHOSPHORUS: Phosphorus: 3.4 mg/dL (ref 2.5–4.6)

## 2021-11-14 LAB — LACTIC ACID, PLASMA: Lactic Acid, Venous: 1.5 mmol/L (ref 0.5–1.9)

## 2021-11-14 LAB — HIV ANTIBODY (ROUTINE TESTING W REFLEX): HIV Screen 4th Generation wRfx: NONREACTIVE

## 2021-11-14 LAB — VITAMIN B12: Vitamin B-12: 169 pg/mL — ABNORMAL LOW (ref 180–914)

## 2021-11-14 LAB — PROCALCITONIN: Procalcitonin: <0.1 ng/mL

## 2021-11-14 LAB — D-DIMER, QUANTITATIVE: D-Dimer, Quant: 0.49 ug/mL-FEU (ref 0.00–0.50)

## 2021-11-14 LAB — FOLATE: Folate: 7.1 ng/mL (ref >5.9)

## 2021-11-14 LAB — MAGNESIUM: Magnesium: 2.5 mg/dL — ABNORMAL HIGH (ref 1.7–2.4)

## 2021-11-14 MED ORDER — MOMETASONE FURO-FORMOTEROL FUM 200-5 MCG/ACT IN AERO
2.0000 | INHALATION_SPRAY | Freq: Two times a day (BID) | RESPIRATORY_TRACT | Status: DC
  Administered 2021-11-14 – 2021-11-20 (×12): 2 via RESPIRATORY_TRACT
  Filled 2021-11-14: qty 8.8

## 2021-11-14 MED ORDER — METHYLPREDNISOLONE SODIUM SUCC 40 MG IJ SOLR
40.0000 mg | Freq: Two times a day (BID) | INTRAMUSCULAR | Status: DC
  Administered 2021-11-14: 09:00:00 40 mg via INTRAVENOUS
  Filled 2021-11-14: qty 1

## 2021-11-14 MED ORDER — NICOTINE 21 MG/24HR TD PT24
21.0000 mg | MEDICATED_PATCH | Freq: Every day | TRANSDERMAL | Status: DC
  Administered 2021-11-14 – 2021-11-20 (×7): 21 mg via TRANSDERMAL
  Filled 2021-11-14 (×7): qty 1

## 2021-11-14 MED ORDER — ONDANSETRON HCL 4 MG/2ML IJ SOLN
4.0000 mg | Freq: Once | INTRAMUSCULAR | Status: AC
  Administered 2021-11-14: 01:00:00 4 mg via INTRAVENOUS
  Filled 2021-11-14: qty 2

## 2021-11-14 MED ORDER — ADULT MULTIVITAMIN W/MINERALS CH
1.0000 | ORAL_TABLET | Freq: Every day | ORAL | Status: DC
  Administered 2021-11-14 – 2021-11-20 (×7): 1 via ORAL
  Filled 2021-11-14 (×8): qty 1

## 2021-11-14 MED ORDER — LORAZEPAM 2 MG/ML IJ SOLN: 1.0000 mg | INTRAMUSCULAR | Status: AC | PRN

## 2021-11-14 MED ORDER — FOLIC ACID 1 MG PO TABS
1.0000 mg | ORAL_TABLET | Freq: Every day | ORAL | Status: DC
  Administered 2021-11-14 – 2021-11-20 (×7): 1 mg via ORAL
  Filled 2021-11-14 (×7): qty 1

## 2021-11-14 MED ORDER — THIAMINE HCL 100 MG PO TABS
100.0000 mg | ORAL_TABLET | Freq: Every day | ORAL | Status: DC
  Administered 2021-11-14 – 2021-11-20 (×7): 100 mg via ORAL
  Filled 2021-11-14 (×7): qty 1

## 2021-11-14 MED ORDER — ENOXAPARIN SODIUM 40 MG/0.4ML IJ SOSY
40.0000 mg | PREFILLED_SYRINGE | INTRAMUSCULAR | Status: DC
  Administered 2021-11-14 – 2021-11-20 (×7): 40 mg via SUBCUTANEOUS
  Filled 2021-11-14 (×7): qty 0.4

## 2021-11-14 MED ORDER — GUAIFENESIN-DM 100-10 MG/5ML PO SYRP
5.0000 mL | ORAL_SOLUTION | ORAL | Status: DC | PRN
  Administered 2021-11-15 – 2021-11-19 (×6): 5 mL via ORAL
  Filled 2021-11-14 (×6): qty 5

## 2021-11-14 MED ORDER — DM-GUAIFENESIN ER 30-600 MG PO TB12
1.0000 | ORAL_TABLET | Freq: Two times a day (BID) | ORAL | Status: DC
  Administered 2021-11-14 – 2021-11-16 (×5): 1 via ORAL
  Filled 2021-11-14: qty 2
  Filled 2021-11-14 (×4): qty 1

## 2021-11-14 MED ORDER — THIAMINE HCL 100 MG/ML IJ SOLN: 100.0000 mg | Freq: Every day | INTRAMUSCULAR | Status: DC

## 2021-11-14 MED ORDER — IOHEXOL 350 MG/ML SOLN
100.0000 mL | Freq: Once | INTRAVENOUS | Status: AC | PRN
  Administered 2021-11-14: 02:00:00 100 mL via INTRAVENOUS

## 2021-11-14 MED ORDER — ACETAMINOPHEN 325 MG PO TABS
650.0000 mg | ORAL_TABLET | Freq: Four times a day (QID) | ORAL | Status: DC | PRN
  Administered 2021-11-14 – 2021-11-20 (×9): 650 mg via ORAL
  Filled 2021-11-14 (×8): qty 2

## 2021-11-14 MED ORDER — IPRATROPIUM BROMIDE 0.02 % IN SOLN
0.5000 mg | Freq: Four times a day (QID) | RESPIRATORY_TRACT | Status: DC
  Administered 2021-11-14: 04:00:00 0.5 mg via RESPIRATORY_TRACT
  Filled 2021-11-14: qty 2.5

## 2021-11-14 MED ORDER — PREDNISONE 20 MG PO TABS: 40.0000 mg | ORAL_TABLET | Freq: Every day | ORAL | Status: DC

## 2021-11-14 MED ORDER — METHYLPREDNISOLONE SODIUM SUCC 40 MG IJ SOLR
40.0000 mg | Freq: Two times a day (BID) | INTRAMUSCULAR | Status: AC
  Administered 2021-11-14 – 2021-11-18 (×9): 40 mg via INTRAVENOUS
  Filled 2021-11-14 (×9): qty 1

## 2021-11-14 MED ORDER — CYANOCOBALAMIN 1000 MCG/ML IJ SOLN
1000.0000 ug | Freq: Once | INTRAMUSCULAR | Status: AC
  Administered 2021-11-14: 22:00:00 1000 ug via INTRAMUSCULAR
  Filled 2021-11-14: qty 1

## 2021-11-14 MED ORDER — LORAZEPAM 1 MG PO TABS: 1.0000 mg | ORAL_TABLET | ORAL | Status: AC | PRN

## 2021-11-14 MED ORDER — PANTOPRAZOLE SODIUM 40 MG PO TBEC
40.0000 mg | DELAYED_RELEASE_TABLET | Freq: Every day | ORAL | Status: DC
  Administered 2021-11-14 – 2021-11-20 (×7): 40 mg via ORAL
  Filled 2021-11-14 (×7): qty 1

## 2021-11-14 MED ORDER — ACETAMINOPHEN 325 MG PO TABS
650.0000 mg | ORAL_TABLET | Freq: Once | ORAL | Status: AC
  Administered 2021-11-14: 01:00:00 650 mg via ORAL
  Filled 2021-11-14: qty 2

## 2021-11-14 MED ORDER — IPRATROPIUM-ALBUTEROL 0.5-2.5 (3) MG/3ML IN SOLN
3.0000 mL | Freq: Four times a day (QID) | RESPIRATORY_TRACT | Status: DC
  Administered 2021-11-14 – 2021-11-20 (×24): 3 mL via RESPIRATORY_TRACT
  Filled 2021-11-14 (×23): qty 3

## 2021-11-14 MED ORDER — ENSURE ENLIVE PO LIQD
237.0000 mL | Freq: Two times a day (BID) | ORAL | Status: DC
  Administered 2021-11-14 – 2021-11-20 (×7): 237 mL via ORAL

## 2021-11-14 MED ORDER — SODIUM CHLORIDE 0.9 % IV SOLN: INTRAVENOUS | Status: DC | PRN

## 2021-11-14 NOTE — Progress Notes (Addendum)
Initial Nutrition Assessment  DOCUMENTATION CODES:   Obesity unspecified  INTERVENTION:  Ensure Enlive po BID, each supplement provides 350 kcal and 20 grams of protein   Heart Healthy diet  NUTRITION DIAGNOSIS:   Inadequate oral intake related to acute illness (COPD exacerbation) as evidenced by per patient/family report of poor oral intake since last Thursday (5 days).   GOAL:  Patient will meet greater than or equal to 90% of their needs  MONITOR:  PO intake, Supplement acceptance, Labs, Skin  REASON FOR ASSESSMENT:   Malnutrition Screening Tool    ASSESSMENT: Patient is an obese 60 yo female with hx of COPD, GERD, Vitamin D deficiency and tobacco use. She presents with shortness of breath. COPD acute exacerbation.   Patient ate 90% of breakfast and drank 100% of Strawberry Ensure. Lunch -~50% of meal. She is complaining of the Heart Healthy diet restriction. Able to feed herself.   Reports poor oral intake since last Thursday. At baseline she enjoys a variety of green vegetables and cooks dinner most days. She is not a big meat eater. Emphasized the importance of protein daily with meals.   Weight encounters reviewed. Reflects a gain between 08/31/21 87.5 kg and currently 91.6 kg. She has ranged between 87-92 kg the past 15 months.   Medications reviewed and include: solumedrol, protonix.   Labs: BMP Latest Ref Rng & Units 11/14/2021 11/13/2021 08/31/2021  Glucose 70 - 99 mg/dL 171(H) 121(H) 84  BUN 6 - 20 mg/dL 12 17 8   Creatinine 0.44 - 1.00 mg/dL 0.47 0.60 0.53  Sodium 135 - 145 mmol/L 137 137 137  Potassium 3.5 - 5.1 mmol/L 4.4 4.2 4.0  Chloride 98 - 111 mmol/L 100 95(L) 99  CO2 22 - 32 mmol/L 29 31 29   Calcium 8.9 - 10.3 mg/dL 8.5(L) 9.2 8.9       NUTRITION - FOCUSED PHYSICAL EXAM:  Flowsheet Row Most Recent Value  Orbital Region No depletion  Upper Arm Region No depletion  Thoracic and Lumbar Region No depletion  Buccal Region No depletion  Temple  Region Mild depletion  Clavicle Bone Region No depletion  Clavicle and Acromion Bone Region Mild depletion  Scapular Bone Region No depletion  Dorsal Hand No depletion  Patellar Region Mild depletion  Anterior Thigh Region No depletion  Posterior Calf Region No depletion  Edema (RD Assessment) None  Hair Reviewed  Eyes Reviewed  Mouth Reviewed  Skin Reviewed  Nails Reviewed       Diet Order:   Diet Order             Diet Heart Room service appropriate? Yes; Fluid consistency: Thin  Diet effective now                   EDUCATION NEEDS:  Education needs have been addressed  Skin:  Skin Assessment: Reviewed RN Assessment  Last BM:  prior to admission  Height:   Ht Readings from Last 1 Encounters:  11/14/21 5\' 6"  (1.676 m)    Weight:   Wt Readings from Last 1 Encounters:  11/14/21 91.6 kg    Ideal Body Weight:   59 kg  BMI:  Body mass index is 32.59 kg/m.  Estimated Nutritional Needs:   Kcal:  1700-1900  Protein:  89-95 gr  Fluid:  1.7-1.9 liters daily   Colman Cater MS,RD,CSG,LDN Contact: Shea Evans

## 2021-11-14 NOTE — H&P (Signed)
History and Physical  Emily Fitzgerald XIP:[id number] DOB: 11-02-1961 DOA: 11/13/2021  Referring physician: Milton Ferguson, MD PCP: [facility]  Patient coming from: Home  Chief Complaint: Shortness of breath  HPI: Emily Fitzgerald is a 60 y.o. female with medical history significant for COPD on 3 LPM of oxygen at baseline, seasonal allergies, GERD, tobacco abuse and obesity who presents to the emergency department due to 4-day onset of cough with production of thick white/yellowish sputum, runny nose, chest congestion, chest tightness and worsening shortness of breath.  Symptoms did not improve with home meds, so she decided to go to the ED for further evaluation and management.  She complained of subjective fever and states that she woke up sweating and with chills last night.  Shortness of breath worsens with ambulation. Patient was admitted from 06/15/2020 to 06/17/2020 due to similar presentation during which she was treated for acute on chronic respiratory failure with hypoxia due to COPD with acute exacerbation, she was treated with IV steroids, nebulizer treatment and antibiotics prior to being discharged home on prednisone taper and doxycycline.  ED Course:  In the emergency department, she was intermittently tachypneic and tachycardic, O2 sat was 74% on room air, this improved to 91% on 5 LPM of oxygen.  Work-up in the ED showed leukocytosis, elevated MCV, normal BMP except mild hyperglycemia, Lactic acid 1.5, D-dimer 0.49.  Influenza A, B, SARS coronavirus 2 was negative. Chest x-ray showed no active cardiopulmonary disease Breathing treatment was provided, Solu-Medrol was given, empiric antibiotics was started due to presumed CAP, IV hydration was provided.  Hospitalist was asked to admit patient for further evaluation and management.  Review of Systems: Constitutional: Positive for chills and subjective fever.  HENT: Positive for runny nose.  Negative for ear pain and  sore throat.   Eyes: Negative for pain and visual disturbance.  Respiratory: Positive for cough, chest tightness and shortness of breath.   Cardiovascular: Negative for chest pain and palpitations.  Gastrointestinal: Negative for abdominal pain and vomiting.  Endocrine: Negative for polyphagia and polyuria.  Genitourinary: Negative for decreased urine volume, dysuria, enuresis Musculoskeletal: Negative for arthralgias and back pain.  Skin: Negative for color change and rash.  Allergic/Immunologic: Negative for immunocompromised state.  Neurological: Negative for tremors, syncope, speech difficulty, weakness, light-headedness and headaches.  Hematological: Does not bruise/bleed easily.  All other systems reviewed and are negative   Past Medical History:  Diagnosis Date   COPD (chronic obstructive pulmonary disease) ([place])    Influenza B 12/2017   Polycythemia    Seasonal allergies    Vitamin D deficiency    Past Surgical History:  Procedure Laterality Date   CESAREAN SECTION      Social History:  reports that she has been smoking cigarettes. She has a 18.50 pack-year smoking history. She has never used smokeless tobacco. She reports current alcohol use. She reports that she does not use drugs.   No Known Allergies  Family History  Problem Relation Age of Onset   Cancer Mother    Leukemia Father    CAD Brother      Prior to Admission medications   Medication Sig Start Date End Date Taking? Authorizing Provider  acetaminophen (TYLENOL) 325 MG tablet Take 325 mg by mouth every 6 (six) hours as needed for moderate pain.    [provider]  albuterol (VENTOLIN HFA) 108 (90 Base) MCG/ACT inhaler Inhale 1-2 puffs into the lungs every 6 (six) hours as needed for wheezing or shortness of  breath. 07/17/21   Chesley Mires, MD  Budeson-Glycopyrrol-Formoterol (BREZTRI AEROSPHERE) 160-9-4.8 MCG/ACT AERO Inhale 2 puffs into the lungs 2 (two) times daily. Patient not taking: No sig  reported 07/17/21   Chesley Mires, MD  cholecalciferol (VITAMIN D3) 25 MCG (1000 UNIT) tablet Take 1,000 Units by mouth daily. Patient not taking: No sig reported    [provider]  cyclobenzaprine (FLEXERIL) 10 MG tablet Take 10 mg by mouth daily as needed for muscle spasms. 07/07/21   [provider]  gabapentin (NEURONTIN) 100 MG capsule Take 100 mg by mouth 3 (three) times daily as needed (for pain).  10/02/18   [provider]  hydrOXYzine (ATARAX/VISTARIL) 25 MG tablet 1 tablet at bedtime as needed Patient not taking: No sig reported 07/07/21   [provider]  ipratropium-albuterol (DUONEB) 0.5-2.5 (3) MG/3ML SOLN Take 3 mLs by nebulization in the morning, at noon, and at bedtime. 07/17/21   Chesley Mires, MD  lidocaine (LIDODERM) 5 % Place 1 patch onto the skin daily. Remove & Discard patch within 12 hours or as directed by MD Patient not taking: No sig reported 06/22/21   Sherrill Raring, PA-C  meloxicam (MOBIC) 7.5 MG tablet Take 1 tablet (7.5 mg total) by mouth daily. Patient not taking: No sig reported 06/22/21   Sherrill Raring, PA-C  meloxicam (MOBIC) 7.5 MG tablet 1 tablet Patient not taking: No sig reported 09/01/20   [provider]  Multiple Vitamin (MULTIVITAMIN WITH MINERALS) TABS tablet Take 1 tablet by mouth daily. Patient not taking: No sig reported    [provider]  omeprazole (PRILOSEC) 20 MG capsule Take 1 capsule (20 mg total) by mouth 2 (two) times daily for 14 days. Patient not taking: Reported on 08/31/2021 06/17/20 07/01/20  Irwin Brakeman L, MD  OXYGEN Inhale 3 L into the lungs daily as needed (FOR SHORTNESS OF BREATH).     [provider]  predniSONE (DELTASONE) 10 MG tablet Take 4 tablets (40 mg total) by mouth daily. 08/31/21   Sherrell Puller, PA-C  SYMBICORT 80-4.5 MCG/ACT inhaler Inhale 2 puffs into the lungs 2 (two) times daily. 07/17/21   Chesley Mires, MD  triamcinolone ointment (KENALOG) 0.5 % 1  application Patient not taking: No sig reported 07/07/21   [provider]    Physical Exam: BP 131/66    Pulse (!) 116    Temp 98.8 F (37.1 C) (Oral)    Resp (!) 22    Ht 5\' 6"  (1.676 m)    Wt 91.6 kg    SpO2 90%    BMI 32.60 kg/m   General: 60 y.o. year-old female well developed well nourished in no acute distress.  Alert and oriented x3. HEENT: NCAT, EOMI Neck: Supple, trachea medial Cardiovascular: Regular rate and rhythm with no rubs or gallops.  No thyromegaly or JVD noted.  No lower extremity edema. 2/4 pulses in all 4 extremities. Respiratory: Diffuse expiratory wheezes in all lobes on auscultation.  No rales.   Abdomen: Soft, nontender nondistended with normal bowel sounds x4 quadrants. Muskuloskeletal: No cyanosis, clubbing or edema noted bilaterally Lymphatic: No axillary or supraclavicular lymphadenopathy Neuro: CN II-XII intact, strength 5/5 x 4, sensation, reflexes intact Skin: No ulcerative lesions noted or rashes Psychiatry: Judgement and insight appear normal. Mood is appropriate for condition and setting          Labs on Admission:  Basic Metabolic Panel: Recent Labs  Lab 11/13/21 2150  NA 137  K 4.2  CL 95*  CO2  31  GLUCOSE 121*  BUN 17  CREATININE 0.60  CALCIUM 9.2   Liver Function Tests: Recent Labs  Lab 11/13/21 2150  AST 27  ALT 26  ALKPHOS 101  BILITOT 0.4  PROT 7.7  ALBUMIN 3.1*   No results for input(s): LIPASE, AMYLASE in the last 168 hours. No results for input(s): AMMONIA in the last 168 hours. CBC: Recent Labs  Lab 11/13/21 2150  WBC 29.8*  NEUTROABS 9.8*  HGB 15.9*  HCT 48.1*  MCV 101.7*  PLT 374   Cardiac Enzymes: No results for input(s): CKTOTAL, CKMB, CKMBINDEX, TROPONINI in the last 168 hours.  BNP (last 3 results) No results for input(s): BNP in the last 8760 hours.  ProBNP (last 3 results) No results for input(s): PROBNP in the last 8760 hours.  CBG: No results for input(s): GLUCAP in the last 168  hours.  Radiological Exams on Admission: DG Chest 2 View  Result Date: 11/13/2021 CLINICAL DATA:  Cough EXAM: CHEST - 2 VIEW COMPARISON:  08/31/2021 FINDINGS: Lungs are well expanded, symmetric, and clear. No pneumothorax or pleural effusion. Cardiac size within normal limits. Pulmonary vascularity is normal. Osseous structures are age-appropriate. No acute bone abnormality. IMPRESSION: No active cardiopulmonary disease. Electronically Signed   By: Fidela Salisbury M.D.   On: 11/13/2021 21:26    EKG: I independently viewed the EKG done and my findings are as followed: Sinus tachycardia at a rate of 114 bpm  Assessment/Plan Present on Admission:  COPD with acute exacerbation ([place])  Principal Problem:   COPD with acute exacerbation ([place]) Active Problems:   Acute on chronic respiratory failure with hypoxia ([facility])   Leukocytosis   Elevated MCV   Tobacco abuse   Obesity (BMI 30.0-34.9)   GERD (gastroesophageal reflux disease)   Acute on chronic respiratory failure with hypoxia secondary to COPD exacerbation Patient uses supplemental oxygen at 3 LPM at home, she is currently on 6 LPM at bedside Chest x-ray showed no active cardiopulmonary disease, CT angiography of chest pending Continue albuterol, Atrovent, Dulera, Mucinex, Solu-Medrol/prednisone, azithromycin. Continue Robitussin Continue Protonix to prevent steroid-induced ulcer Continue incentive spirometry and flutter valve Continue supplemental oxygen to maintain O2 sat > 92% with plan to wean patient off oxygen as tolerated  Atypical pneumonia vs viral pneumonia  Influenza A and B were negative, SARS coronavirus 2 was negative Since patient's symptoms were acute in nature, there is low suspicion for atypical mycobacteria or chronic fungal infection as suggested by CT angiography of chest.  However, it will still be reasonable to rule this out, so QuantiFERON gold will be checked.  Patient will be temporarily placed in isolation  pending the result of this. Patient was empirically started on IV ceftriaxone and azithromycin due to presumed CAP We shall continue with same at this time with plan to de-escalate based on blood culture and procalcitonin Continue Tylenol as needed and continue treating patient as described above.  Chronic leukocytosis with possible superimposed reactive process vs due to CAP WBC 29.8, this was 15.3 about 2 months ago; but has consistently been elevated for at least 1 year  Elevated MCV MCV 101.7, folate and vitamin B-12 will be checked  GERD Continue Protonix  Tobacco abuse Patient was counseled on tobacco abuse cessation.  She states that she has not smoked cigarettes since symptoms started about 4 days ago  Obesity (BMI 32.60 kg/m)  DVT prophylaxis: Lovenox  Code Status: Full code  Family Communication: None at bedside  Disposition Plan:  Patient is from:  home Anticipated DC to:                   SNF or family members home Anticipated DC date:               1- 2 days Anticipated DC barriers:          Patient requires inpatient management due to increased oxygen need in the setting of COPD exacerbation   Consults called: None  Admission status: Observation   Bernadette Hoit MD [facility]  11/14/2021, 2:02 AM

## 2021-11-14 NOTE — Sepsis Progress Note (Signed)
Following per sepsis protocol   

## 2021-11-14 NOTE — Progress Notes (Signed)
Patient seen and evaluated, chart reviewed, please see EMR for updated orders. Please see full H&P dictated by admitting physician Dr Josephine Cables for same date of service.    Brief Summary:- 60 y.o. female with medical history significant for COPD on 3 LPM of oxygen at baseline, seasonal allergies, GERD, tobacco abuse and obesity admitted on 11/14/2021 with acute on chronic hypoxic respiratory failure secondary to presumed COPD exacerbation  A/p 1)Chronic Hypoxic respiratory failure secondary to presumed COPD exacerbation -At baseline patient uses 3 L of oxygen via nasal cannula -Today on 3 L O2 sats was 85% - Oxygen titrated up to 6 L/min to achieve O2 sats of 92 to 93% -COVID and influenza test negative -CT chest suggestive of possible atypical infection including mycobacteria or chronic fungal infection -Reviewed with Dr. Halford Chessman from pulmonary service-who suggested that pt has an  acute infectious process rather than MAI.  If she is able to cough up sputum, then you can send it for AFB smear and culture.  Of course she wouldn't need airborne isolation for this.  She would need sputum positive for AFB from two separate occasion to diagnosis MAI infection, or one sample from bronchoscopy.  Recommend treating r acute infection, and then have her follow up with him in the office to repeat CT chest. -Continue Rocephin and azithromycin as well as bronchodilators and mucolytics and Solu-Medrol -HIV negative, QuantiFERON pending  2)HTN--hold losartan due to contrast allergy  may use IV Hydralazine 10 mg  Every 4 hours Prn for systolic blood pressure over 160 mmhg  3)ETOH Use--lorazepam per CIWA protocol, folic acid and thiamine as ordered  4)B12 deficiency--- elevated MCV noted,- IM B12 shot x1 , May discharge on p.o. B12 -Folate is not low  5)GERD--- continue Protonix especially while on steroids  6) tobacco abuse--- cessation advised, may use nicotine patch  -Total Care time is about 47  minutes-including time spent coordinating care with pulmonologist Dr. Halford Chessman  Patient seen and evaluated, chart reviewed, please see EMR for updated orders. Please see full H&P dictated by admitting physician Dr Josephine Cables for same date of service.   Roxan Hockey, MD

## 2021-11-15 LAB — BASIC METABOLIC PANEL
Anion gap: 7 (ref 5–15)
BUN: 13 mg/dL (ref 6–20)
CO2: 31 mmol/L (ref 22–32)
Calcium: 8.8 mg/dL — ABNORMAL LOW (ref 8.9–10.3)
Chloride: 97 mmol/L — ABNORMAL LOW (ref 98–111)
Creatinine, Ser: 0.48 mg/dL (ref 0.44–1.00)
GFR, Estimated: >60 mL/min (ref >60)
Glucose, Bld: 142 mg/dL — ABNORMAL HIGH (ref 70–99)
Potassium: 4.6 mmol/L (ref 3.5–5.1)
Sodium: 135 mmol/L (ref 135–145)

## 2021-11-15 LAB — PATHOLOGIST SMEAR REVIEW

## 2021-11-15 LAB — CBC
HCT: 41.9 % (ref 36.0–46.0)
Hemoglobin: 13.3 g/dL (ref 12.0–15.0)
MCH: 32.6 pg (ref 26.0–34.0)
MCHC: 31.7 g/dL (ref 30.0–36.0)
MCV: 102.7 fL — ABNORMAL HIGH (ref 80.0–100.0)
Platelets: 292 10*3/uL (ref 150–400)
RBC: 4.08 MIL/uL (ref 3.87–5.11)
RDW: 11.8 % (ref 11.5–15.5)
WBC: 25.9 10*3/uL — ABNORMAL HIGH (ref 4.0–10.5)
nRBC: 0 % (ref 0.0–0.2)

## 2021-11-15 LAB — EXPECTORATED SPUTUM ASSESSMENT W GRAM STAIN, RFLX TO RESP C

## 2021-11-15 MED ORDER — LOSARTAN POTASSIUM 50 MG PO TABS
25.0000 mg | ORAL_TABLET | Freq: Every day | ORAL | Status: DC
  Administered 2021-11-15 – 2021-11-20 (×6): 25 mg via ORAL
  Filled 2021-11-15 (×6): qty 1

## 2021-11-15 MED ORDER — GABAPENTIN 100 MG PO CAPS: 100.0000 mg | ORAL_CAPSULE | Freq: Three times a day (TID) | ORAL | Status: DC | PRN

## 2021-11-15 NOTE — Progress Notes (Signed)
PROGRESS NOTE   Emily Fitzgerald  GHW:[id number] DOB: 09/12/1961 DOA: 11/13/2021 PCP: [facility]   Chief Complaint  Patient presents with   Shortness of Breath   Level of care: Telemetry  Brief Admission History:  60 y.o. female with medical history significant for COPD on 3 LPM of oxygen at baseline, seasonal allergies, GERD, tobacco abuse and obesity admitted on 11/14/2021 with acute on chronic hypoxic respiratory failure secondary to acute COPD exacerbation.    Assessment & Plan:   Principal Problem:   COPD with acute exacerbation ([place]) Active Problems:   Acute on chronic respiratory failure with hypoxia (HCC)   Leukocytosis   Elevated MCV   Tobacco abuse   Obesity (BMI 30.0-34.9)   GERD (gastroesophageal reflux disease)   Acute on chronic respiratory failure with hypoxia secondary to acute COPD exacerbation -Patient reports ongoing shortness of breath cough and wheezing -Continue to wean oxygen as tolerated -Continue current therapies -PT evaluation requested -outpatient follow up with pulmonology clinic  -HIV negative, QuantiFERON pending  Essential hypertension  - resume home losartan 25 mg daily  Chronic alcoholism - continue CIWA protocol  - TOC consulted for substance abuse resources  B12 deficiency  - treated with IM and oral B12 started  GERD  - protonix ordered for GI protection  Tobacco  - nicotine patch ordered   DVT prophylaxis: enoxaparin Code Status: full code  Family Communication: plan discussed with patient  Disposition: anticipate DC home  Status is: Inpatient  Remains inpatient appropriate because: IV antibiotic and IV steroid required  Consultants:  PT   Procedures:  N/a  Antimicrobials:  Ceftriaxone 12/19>> Azithromycin 12/19>>   Subjective: Pt reports shortness of breath and cough and chest congestion.    Objective: Vitals:   11/15/21 0433 11/15/21 0824 11/15/21 0826 11/15/21 1000  BP: (!)  142/73     Pulse: 80     Resp: 19     Temp: 98 F (36.7 C)     TempSrc:      SpO2: 94% 91% 94% 92%  Weight:      Height:        Intake/Output Summary (Last 24 hours) at 11/15/2021 1236 Last data filed at 11/15/2021 0954 Gross per 24 hour  Intake 2114.65 ml  Output 300 ml  Net 1814.65 ml   Filed Weights   11/13/21 2043 11/14/21 0311  Weight: 91.6 kg 91.6 kg    Examination:  General exam: Appears calm and comfortable  Respiratory system: diffuse expiratory wheezing heard bilaterally.  Cardiovascular system: normal S1 & S2 heard. No JVD, murmurs, rubs, gallops or clicks. No pedal edema. Gastrointestinal system: Abdomen is nondistended, soft and nontender. No organomegaly or masses felt. Normal bowel sounds heard. Central nervous system: Alert and oriented. No focal neurological deficits. Extremities: Symmetric 5 x 5 power. Skin: No rashes, lesions or ulcers Psychiatry: Judgement and insight appear normal. Mood & affect appropriate.   Data Reviewed: I have personally reviewed following labs and imaging studies  CBC: Recent Labs  Lab 11/13/21 2150 11/14/21 0613 11/15/21 0557  WBC 29.8* 21.2* 25.9*  NEUTROABS 9.8*  --   --   HGB 15.9* 13.9 13.3  HCT 48.1* 43.3 41.9  MCV 101.7* 102.4* 102.7*  PLT 374 276 789    Basic Metabolic Panel: Recent Labs  Lab 11/13/21 2150 11/14/21 0613 11/15/21 0557  NA 137 137 135  K 4.2 4.4 4.6  CL 95* 100 97*  CO2 31 29 31   GLUCOSE 121* 171* 142*  BUN  17 12 13   CREATININE 0.60 0.47 0.48  CALCIUM 9.2 8.5* 8.8*  MG  --  2.5*  --   PHOS  --  3.4  --     GFR: Estimated Creatinine Clearance: 87.4 mL/min (by C-G formula based on SCr of 0.48 mg/dL).  Liver Function Tests: Recent Labs  Lab 11/13/21 2150 11/14/21 0613  AST 27 21  ALT 26 25  ALKPHOS 101 90  BILITOT 0.4 0.1*  PROT 7.7 6.9  ALBUMIN 3.1* 2.8*    CBG: No results for input(s): GLUCAP in the last 168 hours.  Recent Results (from the past 240 hour(s))  Resp  Panel by RT-PCR (Flu A&B, Covid) Nasopharyngeal Swab     Status: None   Collection Time: 11/13/21  8:51 PM   Specimen: Nasopharyngeal Swab; Nasopharyngeal(NP) swabs in vial transport medium  Result Value Ref Range Status   SARS Coronavirus 2 by RT PCR NEGATIVE NEGATIVE Final    Comment: (NOTE) SARS-CoV-2 target nucleic acids are NOT DETECTED.  The SARS-CoV-2 RNA is generally detectable in upper respiratory specimens during the acute phase of infection. The lowest concentration of SARS-CoV-2 viral copies this assay can detect is 138 copies/mL. A negative result does not preclude SARS-Cov-2 infection and should not be used as the sole basis for treatment or other patient management decisions. A negative result may occur with  improper specimen collection/handling, submission of specimen other than nasopharyngeal swab, presence of viral mutation(s) within the areas targeted by this assay, and inadequate number of viral copies(<138 copies/mL). A negative result must be combined with clinical observations, patient history, and epidemiological information. The expected result is Negative.  Fact Sheet for Patients:  EntrepreneurPulse.com.au  Fact Sheet for Healthcare Providers:  IncredibleEmployment.be  This test is no t yet approved or cleared by the [place] FDA and  has been authorized for detection and/or diagnosis of SARS-CoV-2 by FDA under an Emergency Use Authorization (EUA). This EUA will remain  in effect (meaning this test can be used) for the duration of the COVID-19 declaration under Section 564(b)(1) of the Act, 21 U.S.C.section 360bbb-3(b)(1), unless the authorization is terminated  or revoked sooner.       Influenza A by PCR NEGATIVE NEGATIVE Final   Influenza B by PCR NEGATIVE NEGATIVE Final    Comment: (NOTE) The Xpert Xpress SARS-CoV-2/FLU/RSV plus assay is intended as an aid in the diagnosis of influenza from Nasopharyngeal  swab specimens and should not be used as a sole basis for treatment. Nasal washings and aspirates are unacceptable for Xpert Xpress SARS-CoV-2/FLU/RSV testing.  Fact Sheet for Patients: EntrepreneurPulse.com.au  Fact Sheet for Healthcare Providers: IncredibleEmployment.be  This test is not yet approved or cleared by the [place] FDA and has been authorized for detection and/or diagnosis of SARS-CoV-2 by FDA under an Emergency Use Authorization (EUA). This EUA will remain in effect (meaning this test can be used) for the duration of the COVID-19 declaration under Section 564(b)(1) of the Act, 21 U.S.C. section 360bbb-3(b)(1), unless the authorization is terminated or revoked.  Performed at [facility], [address] [place], [place] [address]   Culture, blood (single)     Status: None (Preliminary result)   Collection Time: 11/13/21 11:38 PM   Specimen: BLOOD RIGHT HAND  Result Value Ref Range Status   Specimen Description BLOOD RIGHT HAND  Final   Special Requests   Final    BOTTLES DRAWN AEROBIC AND ANAEROBIC Blood Culture results may not be optimal due to an excessive volume of  blood received in culture bottles Performed at [facility], [address] [place], [place] [address]    Culture PENDING  Incomplete   Report Status PENDING  Incomplete  Blood culture (routine x 2)     Status: None (Preliminary result)   Collection Time: 11/13/21 11:38 PM   Specimen: BLOOD RIGHT HAND  Result Value Ref Range Status   Specimen Description BLOOD LEFT HAND  Final   Special Requests   Final    BOTTLES DRAWN AEROBIC AND ANAEROBIC Blood Culture results may not be optimal due to an inadequate volume of blood received in culture bottles Performed at [facility], [address] [place], [place] [address]    Culture PENDING  Incomplete   Report Status PENDING  Incomplete  Expectorated Sputum Assessment w Gram Stain, Rflx to Resp Cult     Status:  None   Collection Time: 11/14/21  4:10 PM   Specimen: Sputum  Result Value Ref Range Status   Specimen Description SPUTUM  Final   Special Requests Normal  Final   Sputum evaluation   Final    Sputum specimen not acceptable for testing.  Please recollect.   Performed at [facility], [address], [place], [place] [address]    Report Status 11/14/2021 FINAL  Final     Radiology Studies: DG Chest 2 View  Result Date: 11/13/2021 CLINICAL DATA:  Cough EXAM: CHEST - 2 VIEW COMPARISON:  08/31/2021 FINDINGS: Lungs are well expanded, symmetric, and clear. No pneumothorax or pleural effusion. Cardiac size within normal limits. Pulmonary vascularity is normal. Osseous structures are age-appropriate. No acute bone abnormality. IMPRESSION: No active cardiopulmonary disease. Electronically Signed   By: Fidela Salisbury M.D.   On: 11/13/2021 21:26   CT Angio Chest Pulmonary Embolism (PE) W or WO Contrast  Result Date: 11/14/2021 CLINICAL DATA:  Pulmonary embolism (PE) suspected, high prob. Hypoxia, COPD, productive cough. EXAM: CT ANGIOGRAPHY CHEST WITH CONTRAST TECHNIQUE: Multidetector CT imaging of the chest was performed using the standard protocol during bolus administration of intravenous contrast. Multiplanar CT image reconstructions and MIPs were obtained to evaluate the vascular anatomy. CONTRAST:  142mL OMNIPAQUE IOHEXOL 350 MG/ML SOLN COMPARISON:  None. FINDINGS: Cardiovascular: Adequate opacification of the pulmonary arterial tree. No intraluminal filling defect identified to suggest acute pulmonary embolism. Central pulmonary arteries are of normal caliber. Mild coronary artery calcification. Cardiac size within normal limits. No pericardial effusion. No significant atherosclerotic calcification within the thoracic aorta. No aortic aneurysm. Mediastinum/Nodes: There is pathologic adenopathy within the mediastinum and hilar lymph node groups bilaterally. By example and aortopulmonary lymph node  measures 16 mm in short axis diameter at axial image # 37/4. Right hilar lymph node measures 18 mm in diameter at axial image # 45/4. This may be related to a granulomatous disease, as can be seen with sarcoidosis, granulomatous infection, or lymphoproliferative in nature. Visualized thyroid is unremarkable. Esophagus is unremarkable. Lungs/Pleura: There is bronchial wall thickening in keeping with airway inflammation, scattered areas of airway impaction, best appreciated within the left lower lobe, and tree-in-bud peribronchial nodularity noted diffusely throughout the lungs, most severe within the basilar lingula and left lower lobe. If acute, these findings can be seen in the setting of acute atypical infection including viral infection, or less likely aspiration. If chronic, these findings can be seen the setting of chronic atypical infection such as mycobacterial or atypical fungal pneumonia. Upper Abdomen: Cholelithiasis noted.  No acute abnormality. Musculoskeletal: No acute bone abnormality. No lytic or blastic bone lesion. Review  of the MIP images confirms the above findings. IMPRESSION: No pulmonary embolism. Airway inflammation and peribronchial nodularity in keeping with atypical infection, including viral pneumonia, in the acute setting. If chronic, these can is can be seen in the setting of atypical mycobacterial or chronic fungal infection. Mild coronary artery calcification Pathologic mediastinal and hilar adenopathy, past related to underlying granulomatous disease or lymphoproliferative in nature. Comparison with prior examinations, if available, would be helpful in determining stability. If none are available, PET CT examination may be helpful to assess metabolic activity and direct potential tissue sampling. Electronically Signed   By: Fidela Salisbury M.D.   On: 11/14/2021 02:07    Scheduled Meds:  dextromethorphan-guaiFENesin  1 tablet Oral BID   enoxaparin (LOVENOX) injection  40 mg  Subcutaneous Q24H   feeding supplement  237 mL Oral BID BM   folic acid  1 mg Oral Daily   ipratropium-albuterol  3 mL Nebulization Q6H   methylPREDNISolone (SOLU-MEDROL) injection  40 mg Intravenous Q12H   mometasone-formoterol  2 puff Inhalation BID   multivitamin with minerals  1 tablet Oral Daily   nicotine  21 mg Transdermal Daily   pantoprazole  40 mg Oral Daily   thiamine  100 mg Oral Daily   Or   thiamine  100 mg Intravenous Daily   Continuous Infusions:  sodium chloride Stopped (11/15/21 1017)   azithromycin Stopped (11/15/21 0035)   cefTRIAXone (ROCEPHIN)  IV Stopped (11/15/21 0125)    LOS: 1 day   Time spent: 9 mins   Massiel Stipp Wynetta Emery, MD How to contact the [facility] Attending or Consulting provider [place] or covering provider during after hours [place], for this patient?  Check the care team in [facility] and look for a) attending/consulting TRH provider listed and b) the [facility] team listed Log into [email or web address] and use [place]'s universal password to access. If you do not have the password, please contact the hospital operator. Locate the [facility] provider you are looking for under [facility] and page to a number that you can be directly reached. If you still have difficulty reaching the provider, please page the [facility] (Director on Call) for the Hospitalists listed on amion for assistance.  11/15/2021, 12:36 PM

## 2021-11-15 NOTE — TOC Initial Note (Signed)
Transition of Care [facility]) - Initial/Assessment Note    Patient Details  Name: Emily Fitzgerald MRN: [id number] Date of Birth: 1961/03/14  Transition of [facility]) CM/SW Contact:    Salome Arnt, [place] Phone Number: 11/15/2021, 9:18 AM  Clinical Narrative: Pt admitted due to COPD with acute exacerbation. TOC received consult for substance abuse. Pt reports she lives with some friends. She is independent with ADLs. Pt uses 3L home O2, but is unsure what agency it is through. Pt plans to return home when medically stable. Discussed substance use with pt. She states she drinks 1-2 days a week and usually 3-4 drinks. Pt does not feel her drinking is a problem and declines resources.                   Expected Discharge Plan: Home/Self Care Barriers to Discharge: Continued Medical Work up   Patient Goals and CMS Choice Patient states their goals for this hospitalization and ongoing recovery are:: return home   Choice offered to / list presented to : Patient  Expected Discharge Plan and Services Expected Discharge Plan: Home/Self Care In-house Referral: Clinical Social Work     Living arrangements for the past 2 months: Single Family Home                 DME Arranged: N/A                    Prior Living Arrangements/Services Living arrangements for the past 2 months: Single Family Home Lives with:: Friends Patient language and need for interpreter reviewed:: Yes Do you feel safe going back to the place where you live?: Yes      Need for Family Participation in Patient Care: No (Comment)   Current home services: DME (Home O2) Criminal Activity/Legal Involvement Pertinent to Current Situation/Hospitalization: No - Comment as needed  Activities of Daily Living Home Assistive Devices/Equipment: Oxygen ADL Screening (condition at time of admission) Patient's cognitive ability adequate to safely complete daily activities?: Yes Is the patient deaf or have difficulty  hearing?: No Does the patient have difficulty seeing, even when wearing glasses/contacts?: No Does the patient have difficulty concentrating, remembering, or making decisions?: No Patient able to express need for assistance with ADLs?: Yes Does the patient have difficulty dressing or bathing?: No Independently performs ADLs?: Yes (appropriate for developmental age) Does the patient have difficulty walking or climbing stairs?: No Weakness of Legs: None Weakness of Arms/Hands: None  Permission Sought/Granted                  Emotional Assessment   Attitude/Demeanor/Rapport: Engaged Affect (typically observed): Accepting Orientation: : Oriented to Self, Oriented to Place, Oriented to  Time, Oriented to Situation Alcohol / Substance Use: Alcohol Use Psych Involvement: No (comment)  Admission diagnosis:  COPD with acute exacerbation ([place]) [J44.1] Patient Active Problem List   Diagnosis Date Noted   Leukocytosis 11/14/2021   Elevated MCV 11/14/2021   Tobacco abuse 11/14/2021   Obesity (BMI 30.0-34.9) 11/14/2021   GERD (gastroesophageal reflux disease) 11/14/2021   Transaminasemia    COPD with acute exacerbation ([place]) 01/08/2018   Community acquired pneumonia of left lower lobe of lung 01/04/2018   Acute on chronic respiratory failure with hypoxia ([place]) 01/04/2018   Elevated LFTs 01/04/2018   PCP:  [facility] [place]:   [place], [place] [address] [place] [address] Phone: [phone] Fax: [phone]  Social Determinants of Health (SDOH) Interventions    Readmission Risk Interventions No flowsheet data found.

## 2021-11-16 LAB — QUANTIFERON-TB GOLD PLUS (RQFGPL)
QuantiFERON Mitogen Value: 0.07 IU/mL
QuantiFERON Nil Value: 0 IU/mL
QuantiFERON TB1 Ag Value: 0 IU/mL
QuantiFERON TB2 Ag Value: 0 IU/mL

## 2021-11-16 LAB — QUANTIFERON-TB GOLD PLUS: QuantiFERON-TB Gold Plus: UNDETERMINED — AB

## 2021-11-16 MED ORDER — AMLODIPINE BESYLATE 5 MG PO TABS
5.0000 mg | ORAL_TABLET | Freq: Every day | ORAL | Status: DC
  Administered 2021-11-16 – 2021-11-20 (×5): 5 mg via ORAL
  Filled 2021-11-16 (×5): qty 1

## 2021-11-16 MED ORDER — GUAIFENESIN ER 600 MG PO TB12
1200.0000 mg | ORAL_TABLET | Freq: Two times a day (BID) | ORAL | Status: DC
  Administered 2021-11-16 – 2021-11-20 (×8): 1200 mg via ORAL
  Filled 2021-11-16 (×8): qty 2

## 2021-11-16 NOTE — Progress Notes (Signed)
PROGRESS NOTE   Emily Fitzgerald  [id number]:[id number] DOB: 02-13-1961 DOA: 11/13/2021 [facility]CP: [facility]   Chief Complaint  Patient presents with   Shortness of Breath   Level of care: Telemetry  Brief Admission History:  60 y.o. female with medical history significant for COPD on 3 LPM of oxygen at baseline, seasonal allergies, GERD, tobacco abuse and obesity admitted on 11/14/2021 with acute on chronic hypoxic respiratory failure secondary to acute COPD exacerbation.    Assessment & Plan:   Principal Problem:   COPD with acute exacerbation ([place]) Active Problems:   Acute on chronic respiratory failure with hypoxia (HCC)   Leukocytosis   Elevated MCV   Tobacco abuse   Obesity (BMI 30.0-34.9)   GERD (gastroesophageal reflux disease)   Acute on chronic respiratory failure with hypoxia secondary to acute COPD exacerbation -Patient reports ongoing shortness of breath cough and wheezing -Continue to wean oxygen as tolerated -Continue current therapies -PT evaluation requested -outpatient follow up with pulmonology clinic  -HIV negative, QuantiFERON pending  Essential hypertension  - resume home losartan 25 mg daily - add amlodipine 5 mg daily   Chronic alcoholism - continue CIWA protocol  - TOC consulted for substance abuse resources  B12 deficiency  - treated with IM and oral B12 started  GERD  - protonix ordered for GI protection  Tobacco  - nicotine patch ordered   DVT prophylaxis: enoxaparin Code Status: full code  Family Communication: plan discussed with patient  Disposition: anticipate DC home  Status is: Inpatient  Remains inpatient appropriate because: IV antibiotic and IV steroid required  Consultants:  PT   Procedures:  N/a  Antimicrobials:  Ceftriaxone 12/19>> Azithromycin 12/19>>   Subjective: Pt remains on high flow oxygen, still c/o shortness of breath and not able to walk to bathroom without SOB.     Objective: Vitals:   11/16/21 0803 11/16/21 0810 11/16/21 1311 11/16/21 1410  BP:   (!) 150/72   Pulse:   87   Resp:   20   Temp:   98.3 F (36.8 C)   TempSrc:   Oral   SpO2: 96% 100% 94% 90%  Weight:      Height:        Intake/Output Summary (Last 24 hours) at 11/16/2021 1833 Last data filed at 11/16/2021 1700 Gross per 24 hour  Intake 1050 ml  Output --  Net 1050 ml   Filed Weights   11/13/21 2043 11/14/21 0311  Weight: 91.6 kg 91.6 kg    Examination:  General exam: Appears calm and comfortable  Respiratory system: diffuse expiratory wheezing heard bilaterally.  Cardiovascular system: normal S1 & S2 heard. No JVD, murmurs, rubs, gallops or clicks. No pedal edema. Gastrointestinal system: Abdomen is nondistended, soft and nontender. No organomegaly or masses felt. Normal bowel sounds heard. Central nervous system: Alert and oriented. No focal neurological deficits. Extremities: Symmetric 5 x 5 power. Skin: No rashes, lesions or ulcers Psychiatry: Judgement and insight appear normal. Mood & affect appropriate.   Data Reviewed: I have personally reviewed following labs and imaging studies  CBC: Recent Labs  Lab 11/13/21 2150 11/14/21 0613 11/15/21 0557  WBC 29.8* 21.2* 25.9*  NEUTROABS 9.8*  --   --   HGB 15.9* 13.9 13.3  HCT 48.1* 43.3 41.9  MCV 101.7* 102.4* 102.7*  PLT 374 276 448    Basic Metabolic Panel: Recent Labs  Lab 11/13/21 2150 11/14/21 0613 11/15/21 0557  NA 137 137 135  K 4.2 4.4 4.6  CL 95* 100 97*  CO2 31 29 31   GLUCOSE 121* 171* 142*  BUN 17 12 13   CREATININE 0.60 0.47 0.48  CALCIUM 9.2 8.5* 8.8*  MG  --  2.5*  --   PHOS  --  3.4  --     GFR: Estimated Creatinine Clearance: 87.4 mL/min (by C-G formula based on SCr of 0.48 mg/dL).  Liver Function Tests: Recent Labs  Lab 11/13/21 2150 11/14/21 0613  AST 27 21  ALT 26 25  ALKPHOS 101 90  BILITOT 0.4 0.1*  PROT 7.7 6.9  ALBUMIN 3.1* 2.8*    CBG: No results for  input(s): GLUCAP in the last 168 hours.  Recent Results (from the past 240 hour(s))  Resp Panel by RT-PCR (Flu A&B, Covid) Nasopharyngeal Swab     Status: None   Collection Time: 11/13/21  8:51 PM   Specimen: Nasopharyngeal Swab; Nasopharyngeal(NP) swabs in vial transport medium  Result Value Ref Range Status   SARS Coronavirus 2 by RT PCR NEGATIVE NEGATIVE Final    Comment: (NOTE) SARS-CoV-2 target nucleic acids are NOT DETECTED.  The SARS-CoV-2 RNA is generally detectable in upper respiratory specimens during the acute phase of infection. The lowest concentration of SARS-CoV-2 viral copies this assay can detect is 138 copies/mL. A negative result does not preclude SARS-Cov-2 infection and should not be used as the sole basis for treatment or other patient management decisions. A negative result may occur with  improper specimen collection/handling, submission of specimen other than nasopharyngeal swab, presence of viral mutation(s) within the areas targeted by this assay, and inadequate number of viral copies(<138 copies/mL). A negative result must be combined with clinical observations, patient history, and epidemiological information. The expected result is Negative.  Fact Sheet for Patients:  EntrepreneurPulse.com.au  Fact Sheet for Healthcare Providers:  IncredibleEmployment.be  This test is no t yet approved or cleared by the [place] FDA and  has been authorized for detection and/or diagnosis of SARS-CoV-2 by FDA under an Emergency Use Authorization (EUA). This EUA will remain  in effect (meaning this test can be used) for the duration of the COVID-19 declaration under Section 564(b)(1) of the Act, 21 U.S.C.section 360bbb-3(b)(1), unless the authorization is terminated  or revoked sooner.       Influenza A by PCR NEGATIVE NEGATIVE Final   Influenza B by PCR NEGATIVE NEGATIVE Final    Comment: (NOTE) The Xpert Xpress  SARS-CoV-2/FLU/RSV plus assay is intended as an aid in the diagnosis of influenza from Nasopharyngeal swab specimens and should not be used as a sole basis for treatment. Nasal washings and aspirates are unacceptable for Xpert Xpress SARS-CoV-2/FLU/RSV testing.  Fact Sheet for Patients: EntrepreneurPulse.com.au  Fact Sheet for Healthcare Providers: IncredibleEmployment.be  This test is not yet approved or cleared by the [place] FDA and has been authorized for detection and/or diagnosis of SARS-CoV-2 by FDA under an Emergency Use Authorization (EUA). This EUA will remain in effect (meaning this test can be used) for the duration of the COVID-19 declaration under Section 564(b)(1) of the Act, 21 U.S.C. section 360bbb-3(b)(1), unless the authorization is terminated or revoked.  Performed at [facility], [address], [place], [place] [address]   Culture, blood (single)     Status: None (Preliminary result)   Collection Time: 11/13/21 11:38 PM   Specimen: BLOOD RIGHT HAND  Result Value Ref Range Status   Specimen Description BLOOD RIGHT HAND  Final   Special Requests   Final    BOTTLES DRAWN  AEROBIC AND ANAEROBIC Blood Culture results may not be optimal due to an excessive volume of blood received in culture bottles   Culture   Final    NO GROWTH 3 DAYS Performed at [facility], [address] [place], [place] [address]    Report Status PENDING  Incomplete  Blood culture (routine x 2)     Status: None (Preliminary result)   Collection Time: 11/13/21 11:38 PM   Specimen: BLOOD LEFT HAND  Result Value Ref Range Status   Specimen Description BLOOD LEFT HAND  Final   Special Requests   Final    BOTTLES DRAWN AEROBIC AND ANAEROBIC Blood Culture results may not be optimal due to an inadequate volume of blood received in culture bottles   Culture   Final    NO GROWTH 3 DAYS Performed at [facility], [address], [place], [place]  [address]    Report Status PENDING  Incomplete  Expectorated Sputum Assessment w Gram Stain, Rflx to Resp Cult     Status: None   Collection Time: 11/14/21  4:10 PM   Specimen: Sputum  Result Value Ref Range Status   Specimen Description SPUTUM  Final   Special Requests Normal  Final   Sputum evaluation   Final    Sputum specimen not acceptable for testing.  Please recollect.   Performed at [facility], [address] [place], [place] [address]    Report Status 11/14/2021 FINAL  Final  Expectorated Sputum Assessment w Gram Stain, Rflx to Resp Cult     Status: None   Collection Time: 11/15/21  6:22 PM   Specimen: Sputum  Result Value Ref Range Status   Specimen Description SPU  Final   Special Requests NONE  Final   Sputum evaluation   Final    THIS SPECIMEN IS ACCEPTABLE FOR SPUTUM CULTURE Performed at [facility], [address], [place], [place] [address]    Report Status 11/15/2021 FINAL  Final  Culture, Respiratory w Gram Stain     Status: None (Preliminary result)   Collection Time: 11/15/21  6:22 PM   Specimen: Sputum  Result Value Ref Range Status   Specimen Description   Final    SPU Performed at [facility], [address] [place], [place] [address]    Special Requests   Final    NONE Reflexed from [id number] Performed at [facility], [address], [place], [place] [address]    Gram Stain   Final    ABUNDANT WBC PRESENT, PREDOMINANTLY PMN MODERATE GRAM POSITIVE COCCI IN PAIRS RARE GRAM NEGATIVE RODS RARE GRAM POSITIVE RODS Performed at [facility], [place] [address], [place], [place] [address]    Culture PENDING  Incomplete   Report Status PENDING  Incomplete     Radiology Studies: No results found.  Scheduled Meds:  enoxaparin (LOVENOX) injection  40 mg Subcutaneous Q24H   feeding supplement  237 mL Oral BID BM   folic acid  1 mg Oral Daily   guaiFENesin  1,200 mg Oral BID   ipratropium-albuterol  3 mL Nebulization Q6H   losartan  25 mg Oral Daily    methylPREDNISolone (SOLU-MEDROL) injection  40 mg Intravenous Q12H   mometasone-formoterol  2 puff Inhalation BID   multivitamin with minerals  1 tablet Oral Daily   nicotine  21 mg Transdermal Daily   pantoprazole  40 mg Oral Daily   thiamine  100 mg Oral Daily   Or   thiamine  100 mg  Intravenous Daily   Continuous Infusions:  sodium chloride Stopped (11/15/21 1017)   azithromycin 500 mg (11/16/21 0025)   cefTRIAXone (ROCEPHIN)  IV 2 g (11/15/21 2337)    LOS: 2 days   Time spent: 53 mins   Ettamae Barkett Wynetta Emery, MD How to contact the [facility] Attending or Consulting provider [place] or covering provider during after hours [place], for this patient?  Check the care team in [facility] and look for a) attending/consulting TRH provider listed and b) the [facility] team listed Log into [email or web address] and use [place]'s universal password to access. If you do not have the password, please contact the hospital operator. Locate the [facility] provider you are looking for under [facility] and page to a number that you can be directly reached. If you still have difficulty reaching the provider, please page the [facility] (Director on Call) for the Hospitalists listed on amion for assistance.  11/16/2021, 6:33 PM

## 2021-11-17 MED ORDER — AZITHROMYCIN 250 MG PO TABS
500.0000 mg | ORAL_TABLET | Freq: Every day | ORAL | Status: AC
  Administered 2021-11-17: 21:00:00 500 mg via ORAL
  Filled 2021-11-17: qty 2

## 2021-11-17 NOTE — Progress Notes (Signed)
PT Cancellation Note  Patient Details Name: Emily Fitzgerald MRN: [id number] DOB: [birth year]   Cancelled Treatment:    Reason Eval/Treat Not Completed: PT screened, no needs identified, will sign off. Upon entering room, patient returning from hospital bathroom. On 3 LPM O2. Stood independently to wash hands while conversing with PT. Patient observed to complete sit to stand and step pivot transfers modified independent. PT suggested patient continue to walk around room and potentially request ambulation in hallways with nursing while on needed supplemental oxygen. Thank you for the referral.  Floria Raveling. Hartnett-Rands, MS, PT Per [place] [phone]  Pamala Hurry  Hartnett-Rands 11/17/2021, 11:45 AM

## 2021-11-17 NOTE — Progress Notes (Signed)
PROGRESS NOTE   Emily Fitzgerald  WUJ:[id number] DOB: [birth year] DOA: 11/13/2021 PCP: [facility]   Chief Complaint  Patient presents with   Shortness of Breath   Level of care: Telemetry  Brief Admission History:  60 y.o. female with medical history significant for COPD on 3 LPM of oxygen at baseline, seasonal allergies, GERD, tobacco abuse and obesity admitted on 11/14/2021 with acute on chronic hypoxic respiratory failure secondary to acute COPD exacerbation.    Assessment & Plan:   Principal Problem:   COPD with acute exacerbation ([place]) Active Problems:   Acute on chronic respiratory failure with hypoxia (HCC)   Leukocytosis   Elevated MCV   Tobacco abuse   Obesity (BMI 30.0-34.9)   GERD (gastroesophageal reflux disease)   Acute on chronic respiratory failure with hypoxia secondary to acute COPD exacerbation -Patient reports ongoing shortness of breath cough and wheezing -Continue to wean oxygen as tolerated -Continue current therapies -PT evaluation requested -outpatient follow up with pulmonology clinic  -HIV negative, QuantiFERON negative  Essential hypertension - better controlled - resumed home losartan 25 mg daily - added amlodipine 5 mg daily   Chronic alcoholism - continue CIWA protocol  - TOC consulted for substance abuse resources  B12 deficiency  - treated with IM and oral B12 started  GERD  - protonix ordered for GI protection  Tobacco  - nicotine patch ordered   DVT prophylaxis: enoxaparin Code Status: full code  Family Communication: plan discussed with patient  Disposition: anticipate DC home in 1-2 days  Status is: Inpatient  Remains inpatient appropriate because: IV antibiotic and IV steroid required  Consultants:  PT   Procedures:  N/a  Antimicrobials:  Ceftriaxone 12/19>> Azithromycin 12/19>>   Subjective: Pt remains on high flow oxygen but slowly coming down to 3L, still c/o shortness of breath,  ambulating more.   Objective: Vitals:   11/17/21 0127 11/17/21 0604 11/17/21 0757 11/17/21 1021  BP:  138/84  (!) 141/64  Pulse:  87  78  Resp:  19    Temp:  98.4 F (36.9 C)    TempSrc:  Oral    SpO2: (!) 89% (!) 87% 90%   Weight:      Height:        Intake/Output Summary (Last 24 hours) at 11/17/2021 1613 Last data filed at 11/17/2021 0700 Gross per 24 hour  Intake 680 ml  Output 600 ml  Net 80 ml   Filed Weights   11/13/21 2043 11/14/21 0311  Weight: 91.6 kg 91.6 kg    Examination:  General exam: Appears calm and comfortable  Respiratory system: better air movement, diffuse expiratory wheezing heard bilaterally.  Cardiovascular system: normal S1 & S2 heard. No JVD, murmurs, rubs, gallops or clicks. No pedal edema. Gastrointestinal system: Abdomen is nondistended, soft and nontender. No organomegaly or masses felt. Normal bowel sounds heard. Central nervous system: Alert and oriented. No focal neurological deficits. Extremities: Symmetric 5 x 5 power. Skin: No rashes, lesions or ulcers Psychiatry: Judgement and insight appear normal. Mood & affect appropriate.   Data Reviewed: I have personally reviewed following labs and imaging studies  CBC: Recent Labs  Lab 11/13/21 2150 11/14/21 0613 11/15/21 0557  WBC 29.8* 21.2* 25.9*  NEUTROABS 9.8*  --   --   HGB 15.9* 13.9 13.3  HCT 48.1* 43.3 41.9  MCV 101.7* 102.4* 102.7*  PLT 374 276 956    Basic Metabolic Panel: Recent Labs  Lab 11/13/21 2150 11/14/21 0613 11/15/21 0557  NA  137 137 135  K 4.2 4.4 4.6  CL 95* 100 97*  CO2 31 29 31   GLUCOSE 121* 171* 142*  BUN 17 12 13   CREATININE 0.60 0.47 0.48  CALCIUM 9.2 8.5* 8.8*  MG  --  2.5*  --   PHOS  --  3.4  --     GFR: Estimated Creatinine Clearance: 87.4 mL/min (by C-G formula based on SCr of 0.48 mg/dL).  Liver Function Tests: Recent Labs  Lab 11/13/21 2150 11/14/21 0613  AST 27 21  ALT 26 25  ALKPHOS 101 90  BILITOT 0.4 0.1*  PROT 7.7 6.9   ALBUMIN 3.1* 2.8*    CBG: No results for input(s): GLUCAP in the last 168 hours.  Recent Results (from the past 240 hour(s))  Resp Panel by RT-PCR (Flu A&B, Covid) Nasopharyngeal Swab     Status: None   Collection Time: 11/13/21  8:51 PM   Specimen: Nasopharyngeal Swab; Nasopharyngeal(NP) swabs in vial transport medium  Result Value Ref Range Status   SARS Coronavirus 2 by RT PCR NEGATIVE NEGATIVE Final    Comment: (NOTE) SARS-CoV-2 target nucleic acids are NOT DETECTED.  The SARS-CoV-2 RNA is generally detectable in upper respiratory specimens during the acute phase of infection. The lowest concentration of SARS-CoV-2 viral copies this assay can detect is 138 copies/mL. A negative result does not preclude SARS-Cov-2 infection and should not be used as the sole basis for treatment or other patient management decisions. A negative result may occur with  improper specimen collection/handling, submission of specimen other than nasopharyngeal swab, presence of viral mutation(s) within the areas targeted by this assay, and inadequate number of viral copies(<138 copies/mL). A negative result must be combined with clinical observations, patient history, and epidemiological information. The expected result is Negative.  Fact Sheet for Patients:  EntrepreneurPulse.com.au  Fact Sheet for Healthcare Providers:  IncredibleEmployment.be  This test is no t yet approved or cleared by the [place] FDA and  has been authorized for detection and/or diagnosis of SARS-CoV-2 by FDA under an Emergency Use Authorization (EUA). This EUA will remain  in effect (meaning this test can be used) for the duration of the COVID-19 declaration under Section 564(b)(1) of the Act, 21 U.S.C.section 360bbb-3(b)(1), unless the authorization is terminated  or revoked sooner.       Influenza A by PCR NEGATIVE NEGATIVE Final   Influenza B by PCR NEGATIVE NEGATIVE Final     Comment: (NOTE) The Xpert Xpress SARS-CoV-2/FLU/RSV plus assay is intended as an aid in the diagnosis of influenza from Nasopharyngeal swab specimens and should not be used as a sole basis for treatment. Nasal washings and aspirates are unacceptable for Xpert Xpress SARS-CoV-2/FLU/RSV testing.  Fact Sheet for Patients: EntrepreneurPulse.com.au  Fact Sheet for Healthcare Providers: IncredibleEmployment.be  This test is not yet approved or cleared by the [place] FDA and has been authorized for detection and/or diagnosis of SARS-CoV-2 by FDA under an Emergency Use Authorization (EUA). This EUA will remain in effect (meaning this test can be used) for the duration of the COVID-19 declaration under Section 564(b)(1) of the Act, 21 U.S.C. section 360bbb-3(b)(1), unless the authorization is terminated or revoked.  Performed at [facility], [address] [place], [place] [address]   Culture, blood (single)     Status: None (Preliminary result)   Collection Time: 11/13/21 11:38 PM   Specimen: BLOOD RIGHT HAND  Result Value Ref Range Status   Specimen Description BLOOD RIGHT HAND  Final   Special  Requests   Final    BOTTLES DRAWN AEROBIC AND ANAEROBIC Blood Culture results may not be optimal due to an excessive volume of blood received in culture bottles   Culture   Final    NO GROWTH 4 DAYS Performed at [facility], [address], [place], [place] [address]    Report Status PENDING  Incomplete  Blood culture (routine x 2)     Status: None (Preliminary result)   Collection Time: 11/13/21 11:38 PM   Specimen: BLOOD LEFT HAND  Result Value Ref Range Status   Specimen Description BLOOD LEFT HAND  Final   Special Requests   Final    BOTTLES DRAWN AEROBIC AND ANAEROBIC Blood Culture results may not be optimal due to an inadequate volume of blood received in culture bottles   Culture   Final    NO GROWTH 4 DAYS Performed at [facility], [address], [place], [place] [address]    Report Status PENDING  Incomplete  Expectorated Sputum Assessment w Gram Stain, Rflx to Resp Cult     Status: None   Collection Time: 11/14/21  4:10 PM   Specimen: Sputum  Result Value Ref Range Status   Specimen Description SPUTUM  Final   Special Requests Normal  Final   Sputum evaluation   Final    Sputum specimen not acceptable for testing.  Please recollect.   Performed at [facility], [address] [place], [place] [address]    Report Status 11/14/2021 FINAL  Final  Expectorated Sputum Assessment w Gram Stain, Rflx to Resp Cult     Status: None   Collection Time: 11/15/21  6:22 PM   Specimen: Sputum  Result Value Ref Range Status   Specimen Description SPU  Final   Special Requests NONE  Final   Sputum evaluation   Final    THIS SPECIMEN IS ACCEPTABLE FOR SPUTUM CULTURE Performed at [facility], [address] [place], [place] [address]    Report Status 11/15/2021 FINAL  Final  Culture, Respiratory w Gram Stain     Status: None (Preliminary result)   Collection Time: 11/15/21  6:22 PM   Specimen: Sputum  Result Value Ref Range Status   Specimen Description   Final    SPU Performed at [facility], [address], [place], [place] [address]    Special Requests   Final    NONE Reflexed from [id number] Performed at [facility], [address] [place], [place] [address]    Gram Stain   Final    ABUNDANT WBC PRESENT, PREDOMINANTLY PMN MODERATE GRAM POSITIVE COCCI IN PAIRS RARE GRAM NEGATIVE RODS RARE GRAM POSITIVE RODS    Culture   Final    CULTURE REINCUBATED FOR BETTER GROWTH Performed at [facility], [place] [address], [place], [place] [address]    Report Status PENDING  Incomplete     Radiology Studies: No results found.  Scheduled Meds:  amLODipine  5 mg Oral Daily   azithromycin  500 mg Oral Q2200   enoxaparin (LOVENOX) injection  40 mg Subcutaneous Q24H   feeding supplement  237 mL Oral BID BM   folic  acid  1 mg Oral Daily   guaiFENesin  1,200 mg Oral BID   ipratropium-albuterol  3 mL Nebulization Q6H   losartan  25 mg Oral Daily   methylPREDNISolone (SOLU-MEDROL) injection  40 mg Intravenous Q12H   mometasone-formoterol  2 puff Inhalation BID   multivitamin with minerals  1 tablet Oral  Daily   nicotine  21 mg Transdermal Daily   pantoprazole  40 mg Oral Daily   thiamine  100 mg Oral Daily   Or   thiamine  100 mg Intravenous Daily   Continuous Infusions:  sodium chloride Stopped (11/15/21 1017)   cefTRIAXone (ROCEPHIN)  IV Stopped (11/16/21 2330)    LOS: 3 days   Time spent: 35 mins   Coleman Kalas Wynetta Emery, MD How to contact the [facility] Attending or Consulting provider [place] or covering provider during after hours [place], for this patient?  Check the care team in [facility] and look for a) attending/consulting TRH provider listed and b) the [facility] team listed Log into [email or web address] and use [place]'s universal password to access. If you do not have the password, please contact the hospital operator. Locate the [facility] provider you are looking for under [facility] and page to a number that you can be directly reached. If you still have difficulty reaching the provider, please page the [facility] (Director on Call) for the Hospitalists listed on amion for assistance.  11/17/2021, 4:13 PM

## 2021-11-18 LAB — CULTURE, BLOOD (SINGLE): Culture: NO GROWTH

## 2021-11-18 LAB — CULTURE, RESPIRATORY W GRAM STAIN: Culture: NORMAL

## 2021-11-18 LAB — CULTURE, BLOOD (ROUTINE X 2): Culture: NO GROWTH

## 2021-11-18 MED ORDER — PREDNISONE 20 MG PO TABS
40.0000 mg | ORAL_TABLET | Freq: Every day | ORAL | Status: DC
  Administered 2021-11-19 – 2021-11-20 (×2): 40 mg via ORAL
  Filled 2021-11-18 (×2): qty 2

## 2021-11-18 NOTE — Progress Notes (Signed)
PROGRESS NOTE   Emily Fitzgerald  WUX:[id number] DOB: [birth year] DOA: 11/13/2021 PCP: [facility]   Chief Complaint  Patient presents with   Shortness of Breath   Level of care: Telemetry  Brief Admission History:  60 y.o. female with medical history significant for COPD on 3 LPM of oxygen at baseline, seasonal allergies, GERD, tobacco abuse and obesity admitted on 11/14/2021 with acute on chronic hypoxic respiratory failure secondary to acute COPD exacerbation.    Assessment & Plan:   Principal Problem:   COPD with acute exacerbation ([place]) Active Problems:   Acute on chronic respiratory failure with hypoxia (HCC)   Leukocytosis   Elevated MCV   Tobacco abuse   Obesity (BMI 30.0-34.9)   GERD (gastroesophageal reflux disease)   Acute on chronic respiratory failure with hypoxia secondary to acute COPD exacerbation -Patient reports ongoing shortness of breath cough and wheezing -Continue to wean oxygen as tolerated -Continue current therapies -PT evaluation requested for ambulation  -outpatient follow up with pulmonology clinic  -HIV negative, QuantiFERON negative  Essential hypertension - better controlled - resumed home losartan 25 mg daily - added amlodipine 5 mg daily   Chronic alcoholism - continue CIWA protocol  - TOC consulted for substance abuse resources  B12 deficiency  - treated with IM and oral B12 started  GERD  - protonix ordered for GI protection  Tobacco  - nicotine patch ordered   DVT prophylaxis: enoxaparin Code Status: full code  Family Communication: plan discussed with patient  Disposition: anticipate DC home Monday 12/26  Status is: Inpatient  Remains inpatient appropriate because: IV antibiotic and IV steroid required  Consultants:  PT   Procedures:  N/a  Antimicrobials:  Ceftriaxone 12/19>> Azithromycin 12/19>>   Subjective: Pt having more chest congestion but oxygen is being weaned down further.     Objective: Vitals:   11/17/21 1939 11/17/21 2052 11/18/21 0422 11/18/21 0756  BP:  140/78 (!) 143/87   Pulse:  94 77   Resp:  18 18   Temp:  98 F (36.7 C) 97.8 F (36.6 C)   TempSrc:      SpO2: 90% 97% 93% 95%  Weight:      Height:        Intake/Output Summary (Last 24 hours) at 11/18/2021 1138 Last data filed at 11/18/2021 0500 Gross per 24 hour  Intake 480 ml  Output --  Net 480 ml   Filed Weights   11/13/21 2043 11/14/21 0311  Weight: 91.6 kg 91.6 kg    Examination:  General exam: Appears calm and comfortable  Respiratory system: better air movement, slightly improved expiratory wheezing heard bilaterally.  Cardiovascular system: normal S1 & S2 heard. No JVD, murmurs, rubs, gallops or clicks. No pedal edema. Gastrointestinal system: Abdomen is nondistended, soft and nontender. No organomegaly or masses felt. Normal bowel sounds heard. Central nervous system: Alert and oriented. No focal neurological deficits. Extremities: Symmetric 5 x 5 power. Skin: No rashes, lesions or ulcers Psychiatry: Judgement and insight appear normal. Mood & affect appropriate.   Data Reviewed: I have personally reviewed following labs and imaging studies  CBC: Recent Labs  Lab 11/13/21 2150 11/14/21 0613 11/15/21 0557  WBC 29.8* 21.2* 25.9*  NEUTROABS 9.8*  --   --   HGB 15.9* 13.9 13.3  HCT 48.1* 43.3 41.9  MCV 101.7* 102.4* 102.7*  PLT 374 276 253    Basic Metabolic Panel: Recent Labs  Lab 11/13/21 2150 11/14/21 0613 11/15/21 0557  NA 137 137 135  K 4.2 4.4 4.6  CL 95* 100 97*  CO2 31 29 31   GLUCOSE 121* 171* 142*  BUN 17 12 13   CREATININE 0.60 0.47 0.48  CALCIUM 9.2 8.5* 8.8*  MG  --  2.5*  --   PHOS  --  3.4  --     GFR: Estimated Creatinine Clearance: 87.4 mL/min (by C-G formula based on SCr of 0.48 mg/dL).  Liver Function Tests: Recent Labs  Lab 11/13/21 2150 11/14/21 0613  AST 27 21  ALT 26 25  ALKPHOS 101 90  BILITOT 0.4 0.1*  PROT 7.7 6.9   ALBUMIN 3.1* 2.8*    CBG: No results for input(s): GLUCAP in the last 168 hours.  Recent Results (from the past 240 hour(s))  Resp Panel by RT-PCR (Flu A&B, Covid) Nasopharyngeal Swab     Status: None   Collection Time: 11/13/21  8:51 PM   Specimen: Nasopharyngeal Swab; Nasopharyngeal(NP) swabs in vial transport medium  Result Value Ref Range Status   SARS Coronavirus 2 by RT PCR NEGATIVE NEGATIVE Final    Comment: (NOTE) SARS-CoV-2 target nucleic acids are NOT DETECTED.  The SARS-CoV-2 RNA is generally detectable in upper respiratory specimens during the acute phase of infection. The lowest concentration of SARS-CoV-2 viral copies this assay can detect is 138 copies/mL. A negative result does not preclude SARS-Cov-2 infection and should not be used as the sole basis for treatment or other patient management decisions. A negative result may occur with  improper specimen collection/handling, submission of specimen other than nasopharyngeal swab, presence of viral mutation(s) within the areas targeted by this assay, and inadequate number of viral copies(<138 copies/mL). A negative result must be combined with clinical observations, patient history, and epidemiological information. The expected result is Negative.  Fact Sheet for Patients:  EntrepreneurPulse.com.au  Fact Sheet for Healthcare Providers:  IncredibleEmployment.be  This test is no t yet approved or cleared by the [place] FDA and  has been authorized for detection and/or diagnosis of SARS-CoV-2 by FDA under an Emergency Use Authorization (EUA). This EUA will remain  in effect (meaning this test can be used) for the duration of the COVID-19 declaration under Section 564(b)(1) of the Act, 21 U.S.C.section 360bbb-3(b)(1), unless the authorization is terminated  or revoked sooner.       Influenza A by PCR NEGATIVE NEGATIVE Final   Influenza B by PCR NEGATIVE NEGATIVE Final     Comment: (NOTE) The Xpert Xpress SARS-CoV-2/FLU/RSV plus assay is intended as an aid in the diagnosis of influenza from Nasopharyngeal swab specimens and should not be used as a sole basis for treatment. Nasal washings and aspirates are unacceptable for Xpert Xpress SARS-CoV-2/FLU/RSV testing.  Fact Sheet for Patients: EntrepreneurPulse.com.au  Fact Sheet for Healthcare Providers: IncredibleEmployment.be  This test is not yet approved or cleared by the [place] FDA and has been authorized for detection and/or diagnosis of SARS-CoV-2 by FDA under an Emergency Use Authorization (EUA). This EUA will remain in effect (meaning this test can be used) for the duration of the COVID-19 declaration under Section 564(b)(1) of the Act, 21 U.S.C. section 360bbb-3(b)(1), unless the authorization is terminated or revoked.  Performed at [facility], [address], [place], [place] [address]   Culture, blood (single)     Status: None   Collection Time: 11/13/21 11:38 PM   Specimen: BLOOD RIGHT HAND  Result Value Ref Range Status   Specimen Description BLOOD RIGHT HAND  Final   Special Requests   Final  BOTTLES DRAWN AEROBIC AND ANAEROBIC Blood Culture results may not be optimal due to an excessive volume of blood received in culture bottles   Culture   Final    NO GROWTH 5 DAYS Performed at [facility], [address], [place], [place] [address]    Report Status 11/18/2021 FINAL  Final  Blood culture (routine x 2)     Status: None   Collection Time: 11/13/21 11:38 PM   Specimen: BLOOD LEFT HAND  Result Value Ref Range Status   Specimen Description BLOOD LEFT HAND  Final   Special Requests   Final    BOTTLES DRAWN AEROBIC AND ANAEROBIC Blood Culture results may not be optimal due to an inadequate volume of blood received in culture bottles   Culture   Final    NO GROWTH 5 DAYS Performed at [facility], [address], [place], [place]  [address]    Report Status 11/18/2021 FINAL  Final  Expectorated Sputum Assessment w Gram Stain, Rflx to Resp Cult     Status: None   Collection Time: 11/14/21  4:10 PM   Specimen: Sputum  Result Value Ref Range Status   Specimen Description SPUTUM  Final   Special Requests Normal  Final   Sputum evaluation   Final    Sputum specimen not acceptable for testing.  Please recollect.   Performed at [facility], [address], [place], [place] [address]    Report Status 11/14/2021 FINAL  Final  Expectorated Sputum Assessment w Gram Stain, Rflx to Resp Cult     Status: None   Collection Time: 11/15/21  6:22 PM   Specimen: Sputum  Result Value Ref Range Status   Specimen Description SPU  Final   Special Requests NONE  Final   Sputum evaluation   Final    THIS SPECIMEN IS ACCEPTABLE FOR SPUTUM CULTURE Performed at [facility], [address], [place], [place] [address]    Report Status 11/15/2021 FINAL  Final  Culture, Respiratory w Gram Stain     Status: None   Collection Time: 11/15/21  6:22 PM   Specimen: Sputum  Result Value Ref Range Status   Specimen Description   Final    SPU Performed at [facility], [address] [place], [place] [address]    Special Requests   Final    NONE Reflexed from [id number] Performed at [facility], [address] [place], [place] [address]    Gram Stain   Final    ABUNDANT WBC PRESENT, PREDOMINANTLY PMN MODERATE GRAM POSITIVE COCCI IN PAIRS RARE GRAM NEGATIVE RODS RARE GRAM POSITIVE RODS    Culture   Final    FEW Normal respiratory flora-no Staph aureus or Pseudomonas seen Performed at [facility], [place] [address], [place],  [address]    Report Status 11/18/2021 FINAL  Final     Radiology Studies: No results found.  Scheduled Meds:  amLODipine  5 mg Oral Daily   enoxaparin (LOVENOX) injection  40 mg Subcutaneous Q24H   feeding supplement  237 mL Oral BID BM   folic acid  1 mg Oral Daily   guaiFENesin  1,200 mg Oral BID    ipratropium-albuterol  3 mL Nebulization Q6H   losartan  25 mg Oral Daily   methylPREDNISolone (SOLU-MEDROL) injection  40 mg Intravenous Q12H   mometasone-formoterol  2 puff Inhalation BID   multivitamin with minerals  1 tablet Oral Daily   nicotine  21 mg Transdermal Daily   pantoprazole  40 mg Oral Daily   [START ON 11/19/2021] predniSONE  40 mg Oral Q breakfast   thiamine  100 mg Oral Daily   Or   thiamine  100 mg Intravenous Daily   Continuous Infusions:  sodium chloride Stopped (11/15/21 1017)    LOS: 4 days   Time spent: 35 mins   Mirage Pfefferkorn Wynetta Emery, MD How to contact the [facility] Attending or Consulting provider [place] or covering provider during after hours [place], for this patient?  Check the care team in [facility] and look for a) attending/consulting TRH provider listed and b) the [facility] team listed Log into [email or web address] and use [place]'s universal password to access. If you do not have the password, please contact the hospital operator. Locate the [facility] provider you are looking for under [facility] and page to a number that you can be directly reached. If you still have difficulty reaching the provider, please page the [facility] (Director on Call) for the Hospitalists listed on amion for assistance.  11/18/2021, 11:38 AM

## 2021-11-19 MED ORDER — HYDROCOD POLST-CPM POLST ER 10-8 MG/5ML PO SUER
5.0000 mL | Freq: Two times a day (BID) | ORAL | Status: DC | PRN
  Administered 2021-11-19 – 2021-11-20 (×3): 5 mL via ORAL
  Filled 2021-11-19 (×3): qty 5

## 2021-11-19 NOTE — Progress Notes (Signed)
PROGRESS NOTE   Emily Fitzgerald  IOE:[id number] DOB: 10/31/1961 DOA: 11/13/2021 PCP: [facility]   Chief Complaint  Patient presents with   Shortness of Breath   Level of care: Telemetry  Brief Admission History:  60 y.o. female with medical history significant for COPD on 3 LPM of oxygen at baseline, seasonal allergies, GERD, tobacco abuse and obesity admitted on 11/14/2021 with acute on chronic hypoxic respiratory failure secondary to acute COPD exacerbation.    Assessment & Plan:   Principal Problem:   COPD with acute exacerbation ([place]) Active Problems:   Acute on chronic respiratory failure with hypoxia (HCC)   Leukocytosis   Elevated MCV   Tobacco abuse   Obesity (BMI 30.0-34.9)   GERD (gastroesophageal reflux disease)   Acute on chronic respiratory failure with hypoxia secondary to acute COPD exacerbation -Patient reports ongoing shortness of breath cough and wheezing -Continue to wean oxygen as tolerated -Continue current therapies -PT evaluation requested for ambulation  -outpatient follow up with pulmonology clinic  -HIV negative, QuantiFERON IND -Home O2 eval screen requested for home oxygen orders -add tussionex QHS prn   Essential hypertension - better controlled - resumed home losartan 25 mg daily - added amlodipine 5 mg daily   Chronic alcoholism - continue CIWA protocol  - TOC consulted for substance abuse resources  B12 deficiency  - treated with IM and oral B12 started  GERD  - protonix ordered for GI protection  Tobacco  - nicotine patch ordered   DVT prophylaxis: enoxaparin Code Status: full code  Family Communication: plan discussed with patient  Disposition: anticipate DC home Monday 12/26  Status is: Inpatient  Remains inpatient appropriate because: IV antibiotic and IV steroid required  Consultants:  PT   Procedures:  N/a  Antimicrobials:  Ceftriaxone 12/19>>12/24 Azithromycin 12/19>>  12/23  Subjective: Pt having severe cough at night affecting her ability to sleep.     Objective: Vitals:   11/19/21 0514 11/19/21 0812 11/19/21 0822 11/19/21 0925  BP: (!) 146/86   122/79  Pulse: 93   98  Resp: 19   18  Temp: 97.7 F (36.5 C)     TempSrc:      SpO2: 90% 91% 97% 92%  Weight:      Height:        Intake/Output Summary (Last 24 hours) at 11/19/2021 1122 Last data filed at 11/19/2021 0900 Gross per 24 hour  Intake 480 ml  Output --  Net 480 ml   Filed Weights   11/13/21 2043 11/14/21 0311  Weight: 91.6 kg 91.6 kg    Examination:  General exam: Appears calm and comfortable  Respiratory system: better air movement, slightly improved expiratory wheezing heard bilaterally.  Cardiovascular system: normal S1 & S2 heard. No JVD, murmurs, rubs, gallops or clicks. No pedal edema. Gastrointestinal system: Abdomen is nondistended, soft and nontender. No organomegaly or masses felt. Normal bowel sounds heard. Central nervous system: Alert and oriented. No focal neurological deficits. Extremities: Symmetric 5 x 5 power. Skin: No rashes, lesions or ulcers Psychiatry: Judgement and insight appear normal. Mood & affect appropriate.   Data Reviewed: I have personally reviewed following labs and imaging studies  CBC: Recent Labs  Lab 11/13/21 2150 11/14/21 0613 11/15/21 0557  WBC 29.8* 21.2* 25.9*  NEUTROABS 9.8*  --   --   HGB 15.9* 13.9 13.3  HCT 48.1* 43.3 41.9  MCV 101.7* 102.4* 102.7*  PLT 374 276 182    Basic Metabolic Panel: Recent Labs  Lab  11/13/21 2150 11/14/21 0613 11/15/21 0557  NA 137 137 135  K 4.2 4.4 4.6  CL 95* 100 97*  CO2 31 29 31   GLUCOSE 121* 171* 142*  BUN 17 12 13   CREATININE 0.60 0.47 0.48  CALCIUM 9.2 8.5* 8.8*  MG  --  2.5*  --   PHOS  --  3.4  --     GFR: Estimated Creatinine Clearance: 87.4 mL/min (by C-G formula based on SCr of 0.48 mg/dL).  Liver Function Tests: Recent Labs  Lab 11/13/21 2150 11/14/21 0613   AST 27 21  ALT 26 25  ALKPHOS 101 90  BILITOT 0.4 0.1*  PROT 7.7 6.9  ALBUMIN 3.1* 2.8*    CBG: No results for input(s): GLUCAP in the last 168 hours.  Recent Results (from the past 240 hour(s))  Resp Panel by RT-PCR (Flu A&B, Covid) Nasopharyngeal Swab     Status: None   Collection Time: 11/13/21  8:51 PM   Specimen: Nasopharyngeal Swab; Nasopharyngeal(NP) swabs in vial transport medium  Result Value Ref Range Status   SARS Coronavirus 2 by RT PCR NEGATIVE NEGATIVE Final    Comment: (NOTE) SARS-CoV-2 target nucleic acids are NOT DETECTED.  The SARS-CoV-2 RNA is generally detectable in upper respiratory specimens during the acute phase of infection. The lowest concentration of SARS-CoV-2 viral copies this assay can detect is 138 copies/mL. A negative result does not preclude SARS-Cov-2 infection and should not be used as the sole basis for treatment or other patient management decisions. A negative result may occur with  improper specimen collection/handling, submission of specimen other than nasopharyngeal swab, presence of viral mutation(s) within the areas targeted by this assay, and inadequate number of viral copies(<138 copies/mL). A negative result must be combined with clinical observations, patient history, and epidemiological information. The expected result is Negative.  Fact Sheet for Patients:  EntrepreneurPulse.com.au  Fact Sheet for Healthcare Providers:  IncredibleEmployment.be  This test is no t yet approved or cleared by the [place] FDA and  has been authorized for detection and/or diagnosis of SARS-CoV-2 by FDA under an Emergency Use Authorization (EUA). This EUA will remain  in effect (meaning this test can be used) for the duration of the COVID-19 declaration under Section 564(b)(1) of the Act, 21 U.S.C.section 360bbb-3(b)(1), unless the authorization is terminated  or revoked sooner.       Influenza A by  PCR NEGATIVE NEGATIVE Final   Influenza B by PCR NEGATIVE NEGATIVE Final    Comment: (NOTE) The Xpert Xpress SARS-CoV-2/FLU/RSV plus assay is intended as an aid in the diagnosis of influenza from Nasopharyngeal swab specimens and should not be used as a sole basis for treatment. Nasal washings and aspirates are unacceptable for Xpert Xpress SARS-CoV-2/FLU/RSV testing.  Fact Sheet for Patients: EntrepreneurPulse.com.au  Fact Sheet for Healthcare Providers: IncredibleEmployment.be  This test is not yet approved or cleared by the [place] FDA and has been authorized for detection and/or diagnosis of SARS-CoV-2 by FDA under an Emergency Use Authorization (EUA). This EUA will remain in effect (meaning this test can be used) for the duration of the COVID-19 declaration under Section 564(b)(1) of the Act, 21 U.S.C. section 360bbb-3(b)(1), unless the authorization is terminated or revoked.  Performed at [facility], [address], [place], [place] [address]   Culture, blood (single)     Status: None   Collection Time: 11/13/21 11:38 PM   Specimen: BLOOD RIGHT HAND  Result Value Ref Range Status   Specimen Description BLOOD RIGHT  HAND  Final   Special Requests   Final    BOTTLES DRAWN AEROBIC AND ANAEROBIC Blood Culture results may not be optimal due to an excessive volume of blood received in culture bottles   Culture   Final    NO GROWTH 5 DAYS Performed at [facility], [address], [place], [place] [address]    Report Status 11/18/2021 FINAL  Final  Blood culture (routine x 2)     Status: None   Collection Time: 11/13/21 11:38 PM   Specimen: BLOOD LEFT HAND  Result Value Ref Range Status   Specimen Description BLOOD LEFT HAND  Final   Special Requests   Final    BOTTLES DRAWN AEROBIC AND ANAEROBIC Blood Culture results may not be optimal due to an inadequate volume of blood received in culture bottles   Culture   Final    NO GROWTH 5  DAYS Performed at [facility], [address], [place], [place] [address]    Report Status 11/18/2021 FINAL  Final  Expectorated Sputum Assessment w Gram Stain, Rflx to Resp Cult     Status: None   Collection Time: 11/14/21  4:10 PM   Specimen: Sputum  Result Value Ref Range Status   Specimen Description SPUTUM  Final   Special Requests Normal  Final   Sputum evaluation   Final    Sputum specimen not acceptable for testing.  Please recollect.   Performed at [facility], [address] [place], [place] [address]    Report Status 11/14/2021 FINAL  Final  Expectorated Sputum Assessment w Gram Stain, Rflx to Resp Cult     Status: None   Collection Time: 11/15/21  6:22 PM   Specimen: Sputum  Result Value Ref Range Status   Specimen Description SPU  Final   Special Requests NONE  Final   Sputum evaluation   Final    THIS SPECIMEN IS ACCEPTABLE FOR SPUTUM CULTURE Performed at [facility], [address], [place], [place] [address]    Report Status 11/15/2021 FINAL  Final  Culture, Respiratory w Gram Stain     Status: None   Collection Time: 11/15/21  6:22 PM   Specimen: Sputum  Result Value Ref Range Status   Specimen Description   Final    SPU Performed at [facility], [address] [place], [place] [address]    Special Requests   Final    NONE Reflexed from [id number] Performed at [facility], [address], [place], [place] [address]    Gram Stain   Final    ABUNDANT WBC PRESENT, PREDOMINANTLY PMN MODERATE GRAM POSITIVE COCCI IN PAIRS RARE GRAM NEGATIVE RODS RARE GRAM POSITIVE RODS    Culture   Final    FEW Normal respiratory flora-no Staph aureus or Pseudomonas seen Performed at [facility], [place] [address], [place], [place] [address]    Report Status 11/18/2021 FINAL  Final     Radiology Studies: No results found.  Scheduled Meds:  amLODipine  5 mg Oral Daily   enoxaparin (LOVENOX) injection  40 mg Subcutaneous Q24H   feeding supplement  237 mL Oral BID BM    folic acid  1 mg Oral Daily   guaiFENesin  1,200 mg Oral BID   ipratropium-albuterol  3 mL Nebulization Q6H   losartan  25 mg Oral Daily   mometasone-formoterol  2 puff Inhalation BID   multivitamin with minerals  1 tablet Oral Daily   nicotine  21 mg Transdermal Daily  pantoprazole  40 mg Oral Daily   predniSONE  40 mg Oral Q breakfast   thiamine  100 mg Oral Daily   Or   thiamine  100 mg Intravenous Daily   Continuous Infusions:  sodium chloride Stopped (11/15/21 1017)    LOS: 5 days   Time spent: 35 mins   Epimenio Schetter Wynetta Emery, MD How to contact the [facility] Attending or Consulting provider [place] or covering provider during after hours [place], for this patient?  Check the care team in [facility] and look for a) attending/consulting TRH provider listed and b) the [facility] team listed Log into [email or web address] and use [place]'s universal password to access. If you do not have the password, please contact the hospital operator. Locate the [facility] provider you are looking for under [facility] and page to a number that you can be directly reached. If you still have difficulty reaching the provider, please page the [facility] (Director on Call) for the Hospitalists listed on amion for assistance.  11/19/2021, 11:22 AM

## 2021-11-19 NOTE — Plan of Care (Signed)

## 2021-11-19 NOTE — Progress Notes (Signed)
Pt has o2 to eat and refuse to wear when eating states "o2 makes food taste funny" I explain that her o2 is 86-87% with out it.

## 2021-11-20 MED ORDER — ADULT MULTIVITAMIN W/MINERALS CH: 1.0000 | ORAL_TABLET | Freq: Every day | ORAL | Status: DC

## 2021-11-20 MED ORDER — GUAIFENESIN ER 600 MG PO TB12
1200.0000 mg | ORAL_TABLET | Freq: Two times a day (BID) | ORAL | 0 refills | Status: AC
Start: 2021-11-20 — End: 2021-11-25

## 2021-11-20 MED ORDER — OMEPRAZOLE 20 MG PO CPDR: 20.0000 mg | DELAYED_RELEASE_CAPSULE | Freq: Every day | ORAL | 0 refills | Status: DC

## 2021-11-20 MED ORDER — PREDNISONE 20 MG PO TABS
40.0000 mg | ORAL_TABLET | Freq: Every day | ORAL | 0 refills | Status: AC
Start: 2021-11-21 — End: 2021-11-26

## 2021-11-20 MED ORDER — AMLODIPINE BESYLATE 5 MG PO TABS: 5.0000 mg | ORAL_TABLET | Freq: Every day | ORAL | 1 refills | Status: DC

## 2021-11-20 MED ORDER — DOXYCYCLINE HYCLATE 100 MG PO CAPS: 100.0000 mg | ORAL_CAPSULE | Freq: Two times a day (BID) | ORAL | 0 refills | Status: AC

## 2021-11-20 MED ORDER — HYDROCOD POLST-CPM POLST ER 10-8 MG/5ML PO SUER: 5.0000 mL | Freq: Two times a day (BID) | ORAL | 0 refills | Status: DC | PRN

## 2021-11-20 MED ORDER — FOLIC ACID 1 MG PO TABS: 1.0000 mg | ORAL_TABLET | Freq: Every day | ORAL | 1 refills | Status: DC

## 2021-11-20 MED ORDER — CYANOCOBALAMIN 2000 MCG PO TABS: 2000.0000 ug | ORAL_TABLET | Freq: Every day | ORAL | 1 refills | Status: DC

## 2021-11-20 MED ORDER — THIAMINE HCL 100 MG PO TABS: 100.0000 mg | ORAL_TABLET | Freq: Every day | ORAL | 0 refills | Status: DC

## 2021-11-20 NOTE — Discharge Summary (Signed)
Physician Discharge Summary  Emily Fitzgerald XTK:[id number] DOB: 26-Oct-1961 DOA: 11/13/2021  PCP: [facility] [place] Pulmonology:  [place] date: 11/13/2021 Discharge date: 11/20/2021  Admitted From:  HOME  Disposition:  HOME   Recommendations for Outpatient Follow-up:  Follow up with PCP in 1 weeks Follow up with pulmonology clinic in 2-3 weeks Please check Vitamin B12 level in 1 month to follow up B12 deficiency   Discharge Condition: STABLE   CODE STATUS: FULL  DIET: resume prior heart healthy diet    Brief Hospitalization Summary: Please see all hospital notes, images, labs for full details of the hospitalization. ADMISSION HPI: Emily Fitzgerald is a 60 y.o. female with medical history significant for COPD on 3 LPM of oxygen at baseline, seasonal allergies, GERD, tobacco abuse and obesity who presents to the emergency department due to 4-day onset of cough with production of thick white/yellowish sputum, runny nose, chest congestion, chest tightness and worsening shortness of breath.  Symptoms did not improve with home meds, so she decided to go to the ED for further evaluation and management.  She complained of subjective fever and states that she woke up sweating and with chills last night.  Shortness of breath worsens with ambulation. Patient was admitted from 06/15/2020 to 06/17/2020 due to similar presentation during which she was treated for acute on chronic respiratory failure with hypoxia due to COPD with acute exacerbation, she was treated with IV steroids, nebulizer treatment and antibiotics prior to being discharged home on prednisone taper and doxycycline.   ED Course:  In the emergency department, she was intermittently tachypneic and tachycardic, O2 sat was 74% on room air, this improved to 91% on 5 LPM of oxygen.  Work-up in the ED showed leukocytosis, elevated MCV, normal BMP except mild hyperglycemia, Lactic acid 1.5, D-dimer 0.49.   Influenza A, B, SARS coronavirus 2 was negative.  Chest x-ray showed no active cardiopulmonary disease.  Breathing treatment was provided, Solu-Medrol was given, empiric antibiotics was started due to presumed CAP, IV hydration was provided.  Hospitalist was asked to admit patient for further evaluation and management.  HOSPITAL COURSE BY PROBLEM   Acute on chronic respiratory failure with hypoxia secondary to acute COPD exacerbation -Patient reported ongoing shortness of breath cough and wheezing but now much improved  -weaned oxygen as tolerated -outpatient follow up with pulmonology clinic  -HIV negative, QuantiFERON INDeterminate: follow up in pulmonary clinic -add tussionex QHS prn  -DC home on oral prednisone x 5 more days and doxycycline x 3 days, resume home bronchodilators   Essential hypertension - better controlled - resumed home losartan 25 mg daily - added amlodipine 5 mg daily  - outpatient follow up with PCP    Chronic alcoholism - CIWA protocol in hospital, no acute withdrawal  - TOC consulted for substance abuse resources - vitamins supplemented    B12 deficiency  - treated with IM in hospital and oral B12 started    GERD  - protonix ordered for GI protection in hospital    Tobacco  - nicotine patch ordered, counseled on smoking cessation     DVT prophylaxis: enoxaparin Code Status: full code  Family Communication: plan discussed with patient  Disposition: DC home Monday 12/26  Status is: Inpatient   Consultants:  PT    Procedures:  N/a   Antimicrobials:  Ceftriaxone 12/19>>12/24 Azithromycin 12/19>> 12/23  Discharge Diagnoses:  Principal Problem:   COPD with acute exacerbation ([place]) Active Problems:   Acute on chronic  respiratory failure with hypoxia ([facility])   Leukocytosis   Elevated MCV   Tobacco abuse   Obesity (BMI 30.0-34.9)   GERD (gastroesophageal reflux disease)   Discharge Instructions: Discharge Instructions     Ambulatory  referral to Pulmonology   Complete by: As directed    Hospital follow up 2-3 weeks, already established   Reason for referral: Asthma/COPD      Allergies as of 11/20/2021   No Known Allergies      Medication List     TAKE these medications    acetaminophen 325 MG tablet Commonly known as: TYLENOL Take 325 mg by mouth every 6 (six) hours as needed for moderate pain.   albuterol 108 (90 Base) MCG/ACT inhaler Commonly known as: VENTOLIN HFA Inhale 1-2 puffs into the lungs every 6 (six) hours as needed for wheezing or shortness of breath.   amLODipine 5 MG tablet Commonly known as: NORVASC Take 1 tablet (5 mg total) by mouth daily. Start taking on: November 21, 2021   chlorpheniramine-HYDROcodone 10-8 MG/5ML Suer Commonly known as: TUSSIONEX Take 5 mLs by mouth every 12 (twelve) hours as needed for cough.   cyanocobalamin 2000 MCG tablet Take 1 tablet (2,000 mcg total) by mouth daily.   doxycycline 100 MG capsule Commonly known as: VIBRAMYCIN Take 1 capsule (100 mg total) by mouth 2 (two) times daily for 3 days.   folic acid 1 MG tablet Commonly known as: FOLVITE Take 1 tablet (1 mg total) by mouth daily. Start taking on: November 21, 2021   gabapentin 100 MG capsule Commonly known as: NEURONTIN Take 100 mg by mouth 3 (three) times daily as needed (for pain).   guaiFENesin 600 MG 12 hr tablet Commonly known as: MUCINEX Take 2 tablets (1,200 mg total) by mouth 2 (two) times daily for 5 days.   ipratropium-albuterol 0.5-2.5 (3) MG/3ML Soln Commonly known as: DUONEB Take 3 mLs by nebulization in the morning, at noon, and at bedtime.   losartan 25 MG tablet Commonly known as: COZAAR Take 25 mg by mouth daily.   multivitamin with minerals Tabs tablet Take 1 tablet by mouth daily. Start taking on: November 21, 2021   omeprazole 20 MG capsule Commonly known as: PriLOSEC Take 1 capsule (20 mg total) by mouth daily for 14 days. What changed: when to take this    predniSONE 20 MG tablet Commonly known as: DELTASONE Take 2 tablets (40 mg total) by mouth daily with breakfast for 5 days. Start taking on: November 21, 2021   Symbicort 80-4.5 MCG/ACT inhaler Generic drug: budesonide-formoterol Inhale 2 puffs into the lungs 2 (two) times daily.   thiamine 100 MG tablet Take 1 tablet (100 mg total) by mouth daily. Start taking on: November 21, 2021               Durable Medical Equipment  (From admission, onward)           Start     Ordered   11/19/21 1052  For home use only DME oxygen  Once       Question Answer Comment  Length of Need Lifetime   Mode or (Route) Nasal cannula   Liters per Minute 3   Frequency Continuous (stationary and portable oxygen unit needed)   Oxygen conserving device Yes   Oxygen delivery system Gas      11/19/21 1051            Follow-up Information     Chesley Mires, MD. Schedule an appointment as  soon as possible for a visit in 2 week(s).   Specialty: Pulmonary Disease Why: Hospital Follow Up Contact information: [address] [place] [address] [phone]         [facility]. Schedule an appointment as soon as possible for a visit in 1 week(s).   Why: Hospital Follow Up Contact information: [place] [address] [phone]                No Known Allergies Allergies as of 11/20/2021   No Known Allergies      Medication List     TAKE these medications    acetaminophen 325 MG tablet Commonly known as: TYLENOL Take 325 mg by mouth every 6 (six) hours as needed for moderate pain.   albuterol 108 (90 Base) MCG/ACT inhaler Commonly known as: VENTOLIN HFA Inhale 1-2 puffs into the lungs every 6 (six) hours as needed for wheezing or shortness of breath.   amLODipine 5 MG tablet Commonly known as: NORVASC Take 1 tablet (5 mg total) by mouth daily. Start taking on: November 21, 2021    chlorpheniramine-HYDROcodone 10-8 MG/5ML Suer Commonly known as: TUSSIONEX Take 5 mLs by mouth every 12 (twelve) hours as needed for cough.   cyanocobalamin 2000 MCG tablet Take 1 tablet (2,000 mcg total) by mouth daily.   doxycycline 100 MG capsule Commonly known as: VIBRAMYCIN Take 1 capsule (100 mg total) by mouth 2 (two) times daily for 3 days.   folic acid 1 MG tablet Commonly known as: FOLVITE Take 1 tablet (1 mg total) by mouth daily. Start taking on: November 21, 2021   gabapentin 100 MG capsule Commonly known as: NEURONTIN Take 100 mg by mouth 3 (three) times daily as needed (for pain).   guaiFENesin 600 MG 12 hr tablet Commonly known as: MUCINEX Take 2 tablets (1,200 mg total) by mouth 2 (two) times daily for 5 days.   ipratropium-albuterol 0.5-2.5 (3) MG/3ML Soln Commonly known as: DUONEB Take 3 mLs by nebulization in the morning, at noon, and at bedtime.   losartan 25 MG tablet Commonly known as: COZAAR Take 25 mg by mouth daily.   multivitamin with minerals Tabs tablet Take 1 tablet by mouth daily. Start taking on: November 21, 2021   omeprazole 20 MG capsule Commonly known as: PriLOSEC Take 1 capsule (20 mg total) by mouth daily for 14 days. What changed: when to take this   predniSONE 20 MG tablet Commonly known as: DELTASONE Take 2 tablets (40 mg total) by mouth daily with breakfast for 5 days. Start taking on: November 21, 2021   Symbicort 80-4.5 MCG/ACT inhaler Generic drug: budesonide-formoterol Inhale 2 puffs into the lungs 2 (two) times daily.   thiamine 100 MG tablet Take 1 tablet (100 mg total) by mouth daily. Start taking on: November 21, 2021               Durable Medical Equipment  (From admission, onward)           Start     Ordered   11/19/21 1052  For home use only DME oxygen  Once       Question Answer Comment  Length of Need Lifetime   Mode or (Route) Nasal cannula   Liters per Minute 3   Frequency Continuous  (stationary and portable oxygen unit needed)   Oxygen conserving device Yes   Oxygen delivery system Gas      11/19/21 1051  Procedures/Studies: DG Chest 2 View  Result Date: 11/13/2021 CLINICAL DATA:  Cough EXAM: CHEST - 2 VIEW COMPARISON:  08/31/2021 FINDINGS: Lungs are well expanded, symmetric, and clear. No pneumothorax or pleural effusion. Cardiac size within normal limits. Pulmonary vascularity is normal. Osseous structures are age-appropriate. No acute bone abnormality. IMPRESSION: No active cardiopulmonary disease. Electronically Signed   By: Fidela Salisbury M.D.   On: 11/13/2021 21:26   CT Angio Chest Pulmonary Embolism (PE) W or WO Contrast  Result Date: 11/14/2021 CLINICAL DATA:  Pulmonary embolism (PE) suspected, high prob. Hypoxia, COPD, productive cough. EXAM: CT ANGIOGRAPHY CHEST WITH CONTRAST TECHNIQUE: Multidetector CT imaging of the chest was performed using the standard protocol during bolus administration of intravenous contrast. Multiplanar CT image reconstructions and MIPs were obtained to evaluate the vascular anatomy. CONTRAST:  120mL OMNIPAQUE IOHEXOL 350 MG/ML SOLN COMPARISON:  None. FINDINGS: Cardiovascular: Adequate opacification of the pulmonary arterial tree. No intraluminal filling defect identified to suggest acute pulmonary embolism. Central pulmonary arteries are of normal caliber. Mild coronary artery calcification. Cardiac size within normal limits. No pericardial effusion. No significant atherosclerotic calcification within the thoracic aorta. No aortic aneurysm. Mediastinum/Nodes: There is pathologic adenopathy within the mediastinum and hilar lymph node groups bilaterally. By example and aortopulmonary lymph node measures 16 mm in short axis diameter at axial image # 37/4. Right hilar lymph node measures 18 mm in diameter at axial image # 45/4. This may be related to a granulomatous disease, as can be seen with sarcoidosis, granulomatous infection,  or lymphoproliferative in nature. Visualized thyroid is unremarkable. Esophagus is unremarkable. Lungs/Pleura: There is bronchial wall thickening in keeping with airway inflammation, scattered areas of airway impaction, best appreciated within the left lower lobe, and tree-in-bud peribronchial nodularity noted diffusely throughout the lungs, most severe within the basilar lingula and left lower lobe. If acute, these findings can be seen in the setting of acute atypical infection including viral infection, or less likely aspiration. If chronic, these findings can be seen the setting of chronic atypical infection such as mycobacterial or atypical fungal pneumonia. Upper Abdomen: Cholelithiasis noted.  No acute abnormality. Musculoskeletal: No acute bone abnormality. No lytic or blastic bone lesion. Review of the MIP images confirms the above findings. IMPRESSION: No pulmonary embolism. Airway inflammation and peribronchial nodularity in keeping with atypical infection, including viral pneumonia, in the acute setting. If chronic, these can is can be seen in the setting of atypical mycobacterial or chronic fungal infection. Mild coronary artery calcification Pathologic mediastinal and hilar adenopathy, past related to underlying granulomatous disease or lymphoproliferative in nature. Comparison with prior examinations, if available, would be helpful in determining stability. If none are available, PET CT examination may be helpful to assess metabolic activity and direct potential tissue sampling. Electronically Signed   By: Fidela Salisbury M.D.   On: 11/14/2021 02:07     Subjective: Overall much improved from admission, still with some cough and chest congestion.  No fever.    Discharge Exam: Vitals:   11/20/21 0515 11/20/21 0819  BP: 136/70   Pulse: 97   Resp: 20   Temp: 97.9 F (36.6 C)   SpO2: 95% 95%   Vitals:   11/19/21 2132 11/20/21 0131 11/20/21 0515 11/20/21 0819  BP: 138/70  136/70   Pulse: (!)  107  97   Resp: 19  20   Temp: 98 F (36.7 C)  97.9 F (36.6 C)   TempSrc: Oral  Oral   SpO2: 92% 91% 95% 95%  Weight:  Height:       General: Pt is alert, awake, not in acute distress Cardiovascular: RRR, S1/S2 +, no rubs, no gallops Respiratory: rare expiratory wheeze, better air movement Abdominal: Soft, NT, ND, bowel sounds + Extremities: no edema, no cyanosis   The results of significant diagnostics from this hospitalization (including imaging, microbiology, ancillary and laboratory) are listed below for reference.     Microbiology: Recent Results (from the past 240 hour(s))  Resp Panel by RT-PCR (Flu A&B, Covid) Nasopharyngeal Swab     Status: None   Collection Time: 11/13/21  8:51 PM   Specimen: Nasopharyngeal Swab; Nasopharyngeal(NP) swabs in vial transport medium  Result Value Ref Range Status   SARS Coronavirus 2 by RT PCR NEGATIVE NEGATIVE Final    Comment: (NOTE) SARS-CoV-2 target nucleic acids are NOT DETECTED.  The SARS-CoV-2 RNA is generally detectable in upper respiratory specimens during the acute phase of infection. The lowest concentration of SARS-CoV-2 viral copies this assay can detect is 138 copies/mL. A negative result does not preclude SARS-Cov-2 infection and should not be used as the sole basis for treatment or other patient management decisions. A negative result may occur with  improper specimen collection/handling, submission of specimen other than nasopharyngeal swab, presence of viral mutation(s) within the areas targeted by this assay, and inadequate number of viral copies(<138 copies/mL). A negative result must be combined with clinical observations, patient history, and epidemiological information. The expected result is Negative.  Fact Sheet for Patients:  EntrepreneurPulse.com.au  Fact Sheet for Healthcare Providers:  IncredibleEmployment.be  This test is no t yet approved or cleared by the  [place] FDA and  has been authorized for detection and/or diagnosis of SARS-CoV-2 by FDA under an Emergency Use Authorization (EUA). This EUA will remain  in effect (meaning this test can be used) for the duration of the COVID-19 declaration under Section 564(b)(1) of the Act, 21 U.S.C.section 360bbb-3(b)(1), unless the authorization is terminated  or revoked sooner.       Influenza A by PCR NEGATIVE NEGATIVE Final   Influenza B by PCR NEGATIVE NEGATIVE Final    Comment: (NOTE) The Xpert Xpress SARS-CoV-2/FLU/RSV plus assay is intended as an aid in the diagnosis of influenza from Nasopharyngeal swab specimens and should not be used as a sole basis for treatment. Nasal washings and aspirates are unacceptable for Xpert Xpress SARS-CoV-2/FLU/RSV testing.  Fact Sheet for Patients: EntrepreneurPulse.com.au  Fact Sheet for Healthcare Providers: IncredibleEmployment.be  This test is not yet approved or cleared by the [place] FDA and has been authorized for detection and/or diagnosis of SARS-CoV-2 by FDA under an Emergency Use Authorization (EUA). This EUA will remain in effect (meaning this test can be used) for the duration of the COVID-19 declaration under Section 564(b)(1) of the Act, 21 U.S.C. section 360bbb-3(b)(1), unless the authorization is terminated or revoked.  Performed at [facility], [address] [place], [place] [address]   Culture, blood (single)     Status: None   Collection Time: 11/13/21 11:38 PM   Specimen: BLOOD RIGHT HAND  Result Value Ref Range Status   Specimen Description BLOOD RIGHT HAND  Final   Special Requests   Final    BOTTLES DRAWN AEROBIC AND ANAEROBIC Blood Culture results may not be optimal due to an excessive volume of blood received in culture bottles   Culture   Final    NO GROWTH 5 DAYS Performed at [facility], [address] [place], [place] [address]    Report Status  11/18/2021 FINAL   Final  Blood culture (routine x 2)     Status: None   Collection Time: 11/13/21 11:38 PM   Specimen: BLOOD LEFT HAND  Result Value Ref Range Status   Specimen Description BLOOD LEFT HAND  Final   Special Requests   Final    BOTTLES DRAWN AEROBIC AND ANAEROBIC Blood Culture results may not be optimal due to an inadequate volume of blood received in culture bottles   Culture   Final    NO GROWTH 5 DAYS Performed at [facility], [address], [place], [place] [address]    Report Status 11/18/2021 FINAL  Final  Expectorated Sputum Assessment w Gram Stain, Rflx to Resp Cult     Status: None   Collection Time: 11/14/21  4:10 PM   Specimen: Sputum  Result Value Ref Range Status   Specimen Description SPUTUM  Final   Special Requests Normal  Final   Sputum evaluation   Final    Sputum specimen not acceptable for testing.  Please recollect.   Performed at [facility], [address], [place], [place] [address]    Report Status 11/14/2021 FINAL  Final  Expectorated Sputum Assessment w Gram Stain, Rflx to Resp Cult     Status: None   Collection Time: 11/15/21  6:22 PM   Specimen: Sputum  Result Value Ref Range Status   Specimen Description SPU  Final   Special Requests NONE  Final   Sputum evaluation   Final    THIS SPECIMEN IS ACCEPTABLE FOR SPUTUM CULTURE Performed at [facility], [address] [place], [place] [address]    Report Status 11/15/2021 FINAL  Final  Culture, Respiratory w Gram Stain     Status: None   Collection Time: 11/15/21  6:22 PM   Specimen: Sputum  Result Value Ref Range Status   Specimen Description   Final    SPU Performed at [facility], [address], [place], [place] [address]    Special Requests   Final    NONE Reflexed from [id number] Performed at [facility], [address], [place], [place] [address]    Gram Stain   Final    ABUNDANT WBC PRESENT, PREDOMINANTLY PMN MODERATE GRAM POSITIVE COCCI IN PAIRS RARE GRAM NEGATIVE RODS RARE GRAM POSITIVE  RODS    Culture   Final    FEW Normal respiratory flora-no Staph aureus or Pseudomonas seen Performed at [facility], [place] [address] [place], [place] [address]    Report Status 11/18/2021 FINAL  Final     Labs: BNP (last 3 results) No results for input(s): BNP in the last 8760 hours. Basic Metabolic Panel: Recent Labs  Lab 11/13/21 2150 11/14/21 0613 11/15/21 0557  NA 137 137 135  K 4.2 4.4 4.6  CL 95* 100 97*  CO2 31 29 31   GLUCOSE 121* 171* 142*  BUN 17 12 13   CREATININE 0.60 0.47 0.48  CALCIUM 9.2 8.5* 8.8*  MG  --  2.5*  --   PHOS  --  3.4  --    Liver Function Tests: Recent Labs  Lab 11/13/21 2150 11/14/21 0613  AST 27 21  ALT 26 25  ALKPHOS 101 90  BILITOT 0.4 0.1*  PROT 7.7 6.9  ALBUMIN 3.1* 2.8*   No results for input(s): LIPASE, AMYLASE in the last 168 hours. No results for input(s): AMMONIA in the last 168 hours. CBC: Recent Labs  Lab 11/13/21 2150 11/14/21 0613 11/15/21 0557  WBC  29.8* 21.2* 25.9*  NEUTROABS 9.8*  --   --   HGB 15.9* 13.9 13.3  HCT 48.1* 43.3 41.9  MCV 101.7* 102.4* 102.7*  PLT 374 276 292   Cardiac Enzymes: No results for input(s): CKTOTAL, CKMB, CKMBINDEX, TROPONINI in the last 168 hours. BNP: Invalid input(s): POCBNP CBG: No results for input(s): GLUCAP in the last 168 hours. D-Dimer No results for input(s): DDIMER in the last 72 hours. Hgb A1c No results for input(s): HGBA1C in the last 72 hours. Lipid Profile No results for input(s): CHOL, HDL, LDLCALC, TRIG, CHOLHDL, LDLDIRECT in the last 72 hours. Thyroid function studies No results for input(s): TSH, T4TOTAL, T3FREE, THYROIDAB in the last 72 hours.  Invalid input(s): FREET3 Anemia work up No results for input(s): VITAMINB12, FOLATE, FERRITIN, TIBC, IRON, RETICCTPCT in the last 72 hours. Urinalysis    Component Value Date/Time   COLORURINE YELLOW 06/22/2021 1150   APPEARANCEUR CLEAR 06/22/2021 1150   LABSPEC 1.019 06/22/2021 1150   PHURINE 6.0  06/22/2021 1150   GLUCOSEU NEGATIVE 06/22/2021 1150   HGBUR NEGATIVE 06/22/2021 [place] 06/22/2021 [place] 06/22/2021 [place] 06/22/2021 1150   NITRITE NEGATIVE 06/22/2021 [place] 06/22/2021 1150   Sepsis Labs Invalid input(s): PROCALCITONIN,  WBC,  LACTICIDVEN Microbiology Recent Results (from the past 240 hour(s))  Resp Panel by RT-PCR (Flu A&B, Covid) Nasopharyngeal Swab     Status: None   Collection Time: 11/13/21  8:51 PM   Specimen: Nasopharyngeal Swab; Nasopharyngeal(NP) swabs in vial transport medium  Result Value Ref Range Status   SARS Coronavirus 2 by RT PCR NEGATIVE NEGATIVE Final    Comment: (NOTE) SARS-CoV-2 target nucleic acids are NOT DETECTED.  The SARS-CoV-2 RNA is generally detectable in upper respiratory specimens during the acute phase of infection. The lowest concentration of SARS-CoV-2 viral copies this assay can detect is 138 copies/mL. A negative result does not preclude SARS-Cov-2 infection and should not be used as the sole basis for treatment or other patient management decisions. A negative result may occur with  improper specimen collection/handling, submission of specimen other than nasopharyngeal swab, presence of viral mutation(s) within the areas targeted by this assay, and inadequate number of viral copies(<138 copies/mL). A negative result must be combined with clinical observations, patient history, and epidemiological information. The expected result is Negative.  Fact Sheet for Patients:  EntrepreneurPulse.com.au  Fact Sheet for Healthcare Providers:  IncredibleEmployment.be  This test is no t yet approved or cleared by the [place] FDA and  has been authorized for detection and/or diagnosis of SARS-CoV-2 by FDA under an Emergency Use Authorization (EUA). This EUA will remain  in effect (meaning this test can be used)  for the duration of the COVID-19 declaration under Section 564(b)(1) of the Act, 21 U.S.C.section 360bbb-3(b)(1), unless the authorization is terminated  or revoked sooner.       Influenza A by PCR NEGATIVE NEGATIVE Final   Influenza B by PCR NEGATIVE NEGATIVE Final    Comment: (NOTE) The Xpert Xpress SARS-CoV-2/FLU/RSV plus assay is intended as an aid in the diagnosis of influenza from Nasopharyngeal swab specimens and should not be used as a sole basis for treatment. Nasal washings and aspirates are unacceptable for Xpert Xpress SARS-CoV-2/FLU/RSV testing.  Fact Sheet for Patients: EntrepreneurPulse.com.au  Fact Sheet for Healthcare Providers: IncredibleEmployment.be  This test is not yet approved or cleared by the [place] FDA and has been authorized for detection and/or diagnosis  of SARS-CoV-2 by FDA under an Emergency Use Authorization (EUA). This EUA will remain in effect (meaning this test can be used) for the duration of the COVID-19 declaration under Section 564(b)(1) of the Act, 21 U.S.C. section 360bbb-3(b)(1), unless the authorization is terminated or revoked.  Performed at [facility], [address] [place], [place] [address]   Culture, blood (single)     Status: None   Collection Time: 11/13/21 11:38 PM   Specimen: BLOOD RIGHT HAND  Result Value Ref Range Status   Specimen Description BLOOD RIGHT HAND  Final   Special Requests   Final    BOTTLES DRAWN AEROBIC AND ANAEROBIC Blood Culture results may not be optimal due to an excessive volume of blood received in culture bottles   Culture   Final    NO GROWTH 5 DAYS Performed at [facility], [address] [place], [place] [address]    Report Status 11/18/2021 FINAL  Final  Blood culture (routine x 2)     Status: None   Collection Time: 11/13/21 11:38 PM   Specimen: BLOOD LEFT HAND  Result Value Ref Range Status   Specimen Description BLOOD LEFT HAND  Final    Special Requests   Final    BOTTLES DRAWN AEROBIC AND ANAEROBIC Blood Culture results may not be optimal due to an inadequate volume of blood received in culture bottles   Culture   Final    NO GROWTH 5 DAYS Performed at [facility], [address] [place], [place] [address]    Report Status 11/18/2021 FINAL  Final  Expectorated Sputum Assessment w Gram Stain, Rflx to Resp Cult     Status: None   Collection Time: 11/14/21  4:10 PM   Specimen: Sputum  Result Value Ref Range Status   Specimen Description SPUTUM  Final   Special Requests Normal  Final   Sputum evaluation   Final    Sputum specimen not acceptable for testing.  Please recollect.   Performed at [facility], [address] [place], [place] [address]    Report Status 11/14/2021 FINAL  Final  Expectorated Sputum Assessment w Gram Stain, Rflx to Resp Cult     Status: None   Collection Time: 11/15/21  6:22 PM   Specimen: Sputum  Result Value Ref Range Status   Specimen Description SPU  Final   Special Requests NONE  Final   Sputum evaluation   Final    THIS SPECIMEN IS ACCEPTABLE FOR SPUTUM CULTURE Performed at [facility], [address] [place], [place] [address]    Report Status 11/15/2021 FINAL  Final  Culture, Respiratory w Gram Stain     Status: None   Collection Time: 11/15/21  6:22 PM   Specimen: Sputum  Result Value Ref Range Status   Specimen Description   Final    SPU Performed at [facility], [address] [place], [place] [address]    Special Requests   Final    NONE Reflexed from [id number] Performed at [facility], [address] [place], [place] [address]    Gram Stain   Final    ABUNDANT WBC PRESENT, PREDOMINANTLY PMN MODERATE GRAM POSITIVE COCCI IN PAIRS RARE GRAM NEGATIVE RODS RARE GRAM POSITIVE RODS    Culture   Final    FEW Normal respiratory flora-no Staph aureus or Pseudomonas seen Performed at [facility], [place] [address] [place],  [address]    Report Status 11/18/2021  FINAL  Final   Time coordinating  discharge: 38 mins   SIGNED:  Irwin Brakeman, MD  [facility] 11/20/2021, 10:42 AM How to contact the [facility] Attending or Consulting provider [place] or covering provider during after hours [place], for this patient?  Check the care team in [facility] and look for a) attending/consulting TRH provider listed and b) the [facility] team listed Log into [email or web address] and use [place]'s universal password to access. If you do not have the password, please contact the hospital operator. Locate the [facility] provider you are looking for under [facility] and page to a number that you can be directly reached. If you still have difficulty reaching the provider, please page the [facility] (Director on Call) for the Hospitalists listed on amion for assistance.

## 2021-11-20 NOTE — TOC Transition Note (Signed)
Transition of Care [facility]) - CM/SW Discharge Note   Patient Details  Name: Emily Fitzgerald MRN: [id number] Date of Birth: 11/29/1960  Transition of [facility]) CM/SW Contact:  Shade Flood, LCSW Phone Number: 11/20/2021, 10:40 AM   Clinical Narrative:     Pt stable for dc today per MD. Pt informed MD that she has O2 at home and someone will bring her portable for dc.  There are no TOC needs identified for dc.  Final next level of care: Home/Self Care Barriers to Discharge: Barriers Resolved   Patient Goals and CMS Choice Patient states their goals for this hospitalization and ongoing recovery are:: return home   Choice offered to / list presented to : Patient  Discharge Placement                       Discharge Plan and Services In-house Referral: Clinical Social Work              DME Arranged: N/A                    Social Determinants of Health ([place]) Interventions     Readmission Risk Interventions No flowsheet data found.

## 2021-11-20 NOTE — Discharge Instructions (Signed)
IMPORTANT INFORMATION: PAY CLOSE ATTENTION   PHYSICIAN DISCHARGE INSTRUCTIONS  Follow with Primary care provider  [facility]  and other consultants as instructed by your [place] IF SYMPTOMS COME BACK, WORSEN OR NEW PROBLEM DEVELOPS   Please note: You were cared for by a hospitalist during your hospital stay. Every effort will be made to forward records to your primary care provider.  You can request that your primary care provider send for your hospital records if they have not received them.  Once you are discharged, your primary care physician will handle any further medical issues. Please note that NO REFILLS for any discharge medications will be authorized once you are discharged, as it is imperative that you return to your primary care physician (or establish a relationship with a primary care physician if you do not have one) for your post hospital discharge needs so that they can reassess your need for medications and monitor your lab values.  Please get a complete blood count and chemistry panel checked by your Primary MD at your next visit, and again as instructed by your Primary MD.  Get Medicines reviewed and adjusted: Please take all your medications with you for your next visit with your Primary MD  Laboratory/radiological data: Please request your Primary MD to go over all hospital tests and procedure/radiological results at the follow up, please ask your primary care provider to get all Hospital records sent to his/her office.  In some cases, they will be blood work, cultures and biopsy results pending at the time of your discharge. Please request that your primary care provider follow up on these results.  If you are diabetic, please bring your blood sugar readings with you to your follow up appointment with primary care.    Please call and make your follow up appointments as soon as  possible.    Also Note the following: If you experience worsening of your admission symptoms, develop shortness of breath, life threatening emergency, suicidal or homicidal thoughts you must seek medical attention immediately by calling 911 or calling your MD immediately  if symptoms less severe.  You must read complete instructions/literature along with all the possible adverse reactions/side effects for all the Medicines you take and that have been prescribed to you. Take any new Medicines after you have completely understood and accpet all the possible adverse reactions/side effects.   Do not drive when taking Pain medications or sleeping medications (Benzodiazepines)  Do not take more than prescribed Pain, Sleep and Anxiety Medications. It is not advisable to combine anxiety,sleep and pain medications without talking with your primary care practitioner  Special Instructions: If you have smoked or chewed Tobacco  in the last 2 yrs please stop smoking, stop any regular Alcohol  and or any Recreational drug use.  Wear Seat belts while driving.  Do not drive if taking any narcotic, mind altering or controlled substances or recreational drugs or alcohol.

## 2021-11-20 NOTE — Progress Notes (Signed)
Overnight, patient c/o of pain in abdominal and chest muscles from coughing; RN administered PRN Tylenol. Patient resting comfortably at this time.

## 2021-11-20 NOTE — Progress Notes (Signed)
Nsg Discharge Note  Admit Date:  11/13/2021 Discharge date: 11/20/2021   Juliann Pulse to be D/C'd Home per MD order.  AVS completed.  Copy for chart, and copy for patient signed, and dated. Patient/caregiver able to verbalize understanding.  Discharge Medication: Allergies as of 11/20/2021   No Known Allergies      Medication List     TAKE these medications    acetaminophen 325 MG tablet Commonly known as: TYLENOL Take 325 mg by mouth every 6 (six) hours as needed for moderate pain.   albuterol 108 (90 Base) MCG/ACT inhaler Commonly known as: VENTOLIN HFA Inhale 1-2 puffs into the lungs every 6 (six) hours as needed for wheezing or shortness of breath.   amLODipine 5 MG tablet Commonly known as: NORVASC Take 1 tablet (5 mg total) by mouth daily. Start taking on: November 21, 2021   chlorpheniramine-HYDROcodone 10-8 MG/5ML Suer Commonly known as: TUSSIONEX Take 5 mLs by mouth every 12 (twelve) hours as needed for cough.   cyanocobalamin 2000 MCG tablet Take 1 tablet (2,000 mcg total) by mouth daily.   doxycycline 100 MG capsule Commonly known as: VIBRAMYCIN Take 1 capsule (100 mg total) by mouth 2 (two) times daily for 3 days.   folic acid 1 MG tablet Commonly known as: FOLVITE Take 1 tablet (1 mg total) by mouth daily. Start taking on: November 21, 2021   gabapentin 100 MG capsule Commonly known as: NEURONTIN Take 100 mg by mouth 3 (three) times daily as needed (for pain).   guaiFENesin 600 MG 12 hr tablet Commonly known as: MUCINEX Take 2 tablets (1,200 mg total) by mouth 2 (two) times daily for 5 days.   ipratropium-albuterol 0.5-2.5 (3) MG/3ML Soln Commonly known as: DUONEB Take 3 mLs by nebulization in the morning, at noon, and at bedtime.   losartan 25 MG tablet Commonly known as: COZAAR Take 25 mg by mouth daily.   multivitamin with minerals Tabs tablet Take 1 tablet by mouth daily. Start taking on: November 21, 2021   omeprazole 20 MG  capsule Commonly known as: PriLOSEC Take 1 capsule (20 mg total) by mouth daily for 14 days. What changed: when to take this   predniSONE 20 MG tablet Commonly known as: DELTASONE Take 2 tablets (40 mg total) by mouth daily with breakfast for 5 days. Start taking on: November 21, 2021   Symbicort 80-4.5 MCG/ACT inhaler Generic drug: budesonide-formoterol Inhale 2 puffs into the lungs 2 (two) times daily.   thiamine 100 MG tablet Take 1 tablet (100 mg total) by mouth daily. Start taking on: November 21, 2021               Durable Medical Equipment  (From admission, onward)           Start     Ordered   11/19/21 1052  For home use only DME oxygen  Once       Question Answer Comment  Length of Need Lifetime   Mode or (Route) Nasal cannula   Liters per Minute 3   Frequency Continuous (stationary and portable oxygen unit needed)   Oxygen conserving device Yes   Oxygen delivery system Gas      11/19/21 1051            Discharge Assessment: Vitals:   11/20/21 1332 11/20/21 1352  BP: 117/61   Pulse: (!) 108   Resp: 18   Temp: 98.4 F (36.9 C)   SpO2: 93% 94%   Skin clean, dry  and intact without evidence of skin break down, no evidence of skin tears noted. IV catheter discontinued intact. Site without signs and symptoms of complications - no redness or edema noted at insertion site, patient denies c/o pain - only slight tenderness at site.  Dressing with slight pressure applied.  D/c Instructions-Education: Discharge instructions given to patient/family with verbalized understanding. D/c education completed with patient/family including follow up instructions, medication list, d/c activities limitations if indicated, with other d/c instructions as indicated by MD - patient able to verbalize understanding, all questions fully answered. Patient instructed to return to ED, call 911, or call MD for any changes in condition.  Patient escorted via [place], and D/C home  via private auto.  Dorcas Mcmurray, LPN 06/89/3406 8:40 PM

## 2021-11-21 ENCOUNTER — Telehealth: Payer: Self-pay | Admitting: Pulmonary Disease

## 2021-11-21 NOTE — Telephone Encounter (Signed)
Pt has stopped smoking x2 wks.  Asking if Dr. Halford Chessman could prescribe a nicotine patch.  Please advise.

## 2021-11-21 NOTE — Telephone Encounter (Signed)
Okay to send script for nicotine patch.

## 2021-11-22 ENCOUNTER — Other Ambulatory Visit: Payer: Self-pay

## 2021-11-22 NOTE — Telephone Encounter (Signed)
Dr. Halford Chessman, would you like [place] to send script for 7mg /24 hr, 14mg /24 hr, or 21 mg/24 hr patch?   Thanks!

## 2021-11-25 NOTE — Telephone Encounter (Signed)
14 mcg per 24 hrs

## 2021-11-30 ENCOUNTER — Other Ambulatory Visit: Payer: Self-pay

## 2021-11-30 MED ORDER — NICOTINE 14 MG/24HR TD PT24: 14.0000 mg | MEDICATED_PATCH | Freq: Every day | TRANSDERMAL | 0 refills | Status: AC

## 2021-11-30 NOTE — Telephone Encounter (Signed)
Nicotine patch sent. ATC patient to notify. No VM available

## 2022-01-23 ENCOUNTER — Ambulatory Visit (INDEPENDENT_AMBULATORY_CARE_PROVIDER_SITE_OTHER): Payer: Medicaid Other | Admitting: Pulmonary Disease

## 2022-01-23 ENCOUNTER — Encounter: Payer: Self-pay | Admitting: Pulmonary Disease

## 2022-01-23 ENCOUNTER — Other Ambulatory Visit: Payer: Self-pay

## 2022-01-23 VITALS — BP 136/82 | HR 70 | Temp 98.8°F | Ht 66.0 in | Wt 210.0 lb

## 2022-01-23 DIAGNOSIS — R590 Localized enlarged lymph nodes: Secondary | ICD-10-CM | POA: Diagnosis not present

## 2022-01-23 DIAGNOSIS — J9611 Chronic respiratory failure with hypoxia: Secondary | ICD-10-CM

## 2022-01-23 DIAGNOSIS — J449 Chronic obstructive pulmonary disease, unspecified: Secondary | ICD-10-CM

## 2022-01-23 DIAGNOSIS — D751 Secondary polycythemia: Secondary | ICD-10-CM

## 2022-01-23 NOTE — Patient Instructions (Addendum)
Will schedule CT chest   Will arrange for a portable pulse oximetry; goal oxygen level above 90%  Follow up in 6 months

## 2022-01-23 NOTE — Progress Notes (Signed)
[facility]  Chief Complaint  Patient presents with   Follow-up    SOB has improved a little since last OV per patient. [facility] admit from 12/19-12/26 for pneumonia  3LO2 prn     Constitutional:  BP 136/82 (BP Location: Left Arm, Patient Position: Sitting)    Pulse 70    Temp 98.8 F (37.1 C) (Temporal)    Ht 5\' 6"  (1.676 m)    Wt 210 lb (95.3 kg)    SpO2 91% Comment: ra   BMI 33.89 kg/m   Past Medical History:  Polycythemia, Allergies, Vit D deficiency, Influenza B February 2019, ETOH, Hypertension, B12 deficiency, GERD  Past Surgical History:  Her  has a past surgical history that includes Cesarean section.  Brief Summary:  Emily Fitzgerald is a 61 y.o. female former smoker with COPD and chronic respiratory failure with hypoxia and hypercapnia.       Subjective:   She was in hospital in December for pneumonia and COPD exacerbation.  CT chest from then showed diffuse tree in bud and enlarged mediastinal lymph nodes.    She hasn't smoked since being in the hospital.  Using nicotine patch intermittently.  Breathing better.  Has occasional cough with clear sputum.  Not having wheeze, fever, hemoptysis, chest pain, or gland swelling.  Had breztri in hospital.  This felt stronger than symbicort, but she plans to switch back to symbicort.  Hasn't needed albuterol or nebulizer as much.   She uses 3 liters oxygen intermittently when she feels like she needs it.  She doesn't have a pulse oximeter.  Physical Exam:   Appearance - well kempt   ENMT - no sinus tenderness, no oral exudate, no LAN, Mallampati 3 airway, no stridor  Respiratory - decreased breath sounds bilaterally, no wheezing or rales  CV - s1s2 regular rate and rhythm, no murmurs  Ext - no clubbing, no edema  Skin - no rashes  Psych - normal mood and affect     Pulmonary testing:  A1AT 09/09/20 >> 125, MM ABG 11/14/21 >> pH 7.308, PCO2 60.2, PO2 69.2  Chest  Imaging:  CT angio chest 11/14/21 >> enlarged mediastinal LN, bronchial wall thickening, tree in bud diffusely  Sleep Tests:  ONO with RA 11/14/20 >> test time 10 hrs 40 min.  Baseline SpO2 82%, low SpO2 59%.  Spent 9 hrs 54 min with SpO2 < 88%.  Cardiac Tests:  Echo 09/15/20 >> EF 60 to 65%, mild LVH, grade 1 DD  Social History:  She  reports that she quit smoking about 2 months ago. Her smoking use included cigarettes. She has a 18.50 pack-year smoking history. She has never used smokeless tobacco. She reports current alcohol use. She reports that she does not use drugs.  Family History:  Her family history includes CAD in her brother; Cancer in her mother; Leukemia in her father.    Assessment/Plan:   COPD with emphysema. - continue symbicort 80 two puffs bid - prn albuterol hfa, duoneb - will reschedule PFT  Chronic respiratory failure with hypoxia and hypercapnia. - 3 liters at night and prn during the day - goal SpO2 > 90% - will arrange for portable pulse oximeter  Mediastinal adenopathy seen on CT chest from December 2022. - could be reactive in setting of respiratory infection - will repeat CT chest with IV contrast and call her with results  Tobacco abuse. 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 Lindalou Hose with [facility] polycythemia. - followed by Dr. Silvestre Mesi at [facility]   Time Spent Involved in Patient Care on Day of Examination:  38 minutes  Follow up:   Patient Instructions  Will schedule CT chest   Will arrange for a portable pulse oximetry; goal oxygen level above 90%  Follow up in 6 months  Medication List:   Allergies as of 01/23/2022   No Known Allergies      Medication List        Accurate as of January 23, 2022 10:23 AM. If you have any questions, ask your nurse or doctor.           STOP taking these medications    acetaminophen 325 MG tablet Commonly known as: TYLENOL Stopped by: Chesley Mires, MD   chlorpheniramine-HYDROcodone 10-8 MG/5ML Suer Commonly known as: TUSSIONEX Stopped by: Chesley Mires, MD   losartan 25 MG tablet Commonly known as: COZAAR Stopped by: Chesley Mires, MD   omeprazole 20 MG capsule Commonly known as: PriLOSEC Stopped by: Chesley Mires, MD       TAKE these medications    albuterol 108 (90 Base) MCG/ACT inhaler Commonly known as: VENTOLIN HFA Inhale 1-2 puffs into the lungs every 6 (six) hours as needed for wheezing or shortness of breath.   amLODipine 5 MG tablet Commonly known as: NORVASC Take 1 tablet (5 mg total) by mouth daily.   cyanocobalamin 2000 MCG tablet Take 1 tablet (2,000 mcg total) by mouth daily.   folic acid 1 MG tablet Commonly known as: FOLVITE Take 1 tablet (1 mg total) by mouth daily.   gabapentin 100 MG capsule Commonly known as: NEURONTIN Take 100 mg by mouth 3 (three) times daily as needed (for pain).   ipratropium-albuterol 0.5-2.5 (3) MG/3ML Soln Commonly known as: DUONEB Take 3 mLs by nebulization in the morning, at noon, and at bedtime.   multivitamin with minerals Tabs tablet Take 1 tablet by mouth daily.   Symbicort 80-4.5 MCG/ACT inhaler Generic drug: budesonide-formoterol Inhale 2 puffs into the lungs 2 (two) times daily.   thiamine 100 MG tablet Take 1 tablet (100 mg total) by mouth daily.        Signature:  Chesley Mires, MD [place] Pager - [phone] 01/23/2022, 10:23 AM

## 2022-03-05 ENCOUNTER — Ambulatory Visit (HOSPITAL_COMMUNITY): Payer: Medicaid Other

## 2022-03-30 ENCOUNTER — Other Ambulatory Visit (HOSPITAL_COMMUNITY)
Admission: RE | Admit: 2022-03-30 | Discharge: 2022-03-30 | Disposition: A | Discharge status: Home / Self Care | Payer: Medicaid Other | Source: Ambulatory Visit | Attending: Pulmonary Disease | Admitting: Pulmonary Disease

## 2022-03-30 DIAGNOSIS — Z20822 Contact with and (suspected) exposure to covid-19: Secondary | ICD-10-CM | POA: Diagnosis not present

## 2022-03-30 DIAGNOSIS — Z01818 Encounter for other preprocedural examination: Secondary | ICD-10-CM

## 2022-03-31 LAB — SARS CORONAVIRUS 2 (TAT 6-24 HRS): SARS Coronavirus 2: NEGATIVE

## 2022-04-03 ENCOUNTER — Ambulatory Visit (HOSPITAL_COMMUNITY)
Admission: RE | Admit: 2022-04-03 | Discharge: 2022-04-03 | Disposition: A | Discharge status: Home / Self Care | Payer: Medicaid Other | Source: Ambulatory Visit | Attending: Pulmonary Disease | Admitting: Pulmonary Disease

## 2022-04-03 DIAGNOSIS — J449 Chronic obstructive pulmonary disease, unspecified: Secondary | ICD-10-CM | POA: Diagnosis present

## 2022-04-03 LAB — PULMONARY FUNCTION TEST
DL/VA % pred: 115 %
DL/VA: 4.82 ml/min/mmHg/L
DLCO unc % pred: 62 %
DLCO unc: 13.65 ml/min/mmHg
FEF 25-75 Post: 0.45 L/sec
FEF 25-75 Pre: 0.33 L/sec
FEF2575-%Change-Post: 37 %
FEF2575-%Pred-Post: 17 %
FEF2575-%Pred-Pre: 12 %
FEV1-%Change-Post: 8 %
FEV1-%Pred-Post: 34 %
FEV1-%Pred-Pre: 31 %
FEV1-Post: 0.95 L
FEV1-Pre: 0.88 L
FEV1FVC-%Change-Post: 0 %
FEV1FVC-%Pred-Pre: 60 %
FEV6-%Change-Post: 8 %
FEV6-%Pred-Post: 54 %
FEV6-%Pred-Pre: 50 %
FEV6-Post: 1.89 L
FEV6-Pre: 1.74 L
FEV6FVC-%Change-Post: 0 %
FEV6FVC-%Pred-Post: 97 %
FEV6FVC-%Pred-Pre: 97 %
FVC-%Change-Post: 8 %
FVC-%Pred-Post: 56 %
FVC-%Pred-Pre: 51 %
FVC-Post: 2.01 L
FVC-Pre: 1.85 L
Post FEV1/FVC ratio: 47 %
Post FEV6/FVC ratio: 94 %
Pre FEV1/FVC ratio: 48 %
Pre FEV6/FVC Ratio: 94 %
RV % pred: 267 %
RV: 5.53 L
TLC % pred: 139 %
TLC: 7.48 L

## 2022-04-03 MED ORDER — ALBUTEROL SULFATE (2.5 MG/3ML) 0.083% IN NEBU
2.5000 mg | INHALATION_SOLUTION | Freq: Once | RESPIRATORY_TRACT | Status: AC
  Administered 2022-04-03: 2.5 mg via RESPIRATORY_TRACT

## 2022-04-04 ENCOUNTER — Telehealth: Payer: Self-pay | Admitting: Pulmonary Disease

## 2022-04-04 NOTE — Telephone Encounter (Signed)
Called patient back and gave her pft results. Addressed in result note ? ?

## 2022-04-06 ENCOUNTER — Ambulatory Visit (HOSPITAL_COMMUNITY)
Admission: RE | Admit: 2022-04-06 | Discharge: 2022-04-06 | Disposition: A | Discharge status: Home / Self Care | Payer: Medicaid Other | Source: Ambulatory Visit | Attending: Pulmonary Disease | Admitting: Pulmonary Disease

## 2022-04-06 DIAGNOSIS — R590 Localized enlarged lymph nodes: Secondary | ICD-10-CM | POA: Diagnosis present

## 2022-04-06 MED ORDER — IOHEXOL 300 MG/ML  SOLN
75.0000 mL | Freq: Once | INTRAMUSCULAR | Status: AC | PRN
  Administered 2022-04-06: 75 mL via INTRAVENOUS

## 2022-04-10 ENCOUNTER — Telehealth: Payer: Self-pay | Admitting: Pulmonary Disease

## 2022-04-10 NOTE — Telephone Encounter (Signed)
Called and gave pt results. Doc. In result encounter  ?

## 2022-06-11 ENCOUNTER — Other Ambulatory Visit (HOSPITAL_COMMUNITY): Payer: Self-pay | Admitting: Family Medicine

## 2022-06-11 DIAGNOSIS — R921 Mammographic calcification found on diagnostic imaging of breast: Secondary | ICD-10-CM

## 2022-07-06 ENCOUNTER — Telehealth: Payer: Self-pay | Admitting: Pulmonary Disease

## 2022-07-06 DIAGNOSIS — J449 Chronic obstructive pulmonary disease, unspecified: Secondary | ICD-10-CM

## 2022-07-06 NOTE — Telephone Encounter (Signed)
Called patient and she states nebulizer machine has stopped working.  Patient has spoke to [employer] and states they told her if we get the order placed for a new one and sent to them today, that they will work on getting a new neb machine to patient. Patient is concerned that she will be without the nebulizer machine over the weekend.   Order placed as urgent and PCCs notified. Nothing further needed.

## 2022-08-28 ENCOUNTER — Telehealth: Payer: Self-pay

## 2022-08-28 ENCOUNTER — Other Ambulatory Visit (HOSPITAL_COMMUNITY): Payer: Self-pay

## 2022-08-28 ENCOUNTER — Encounter: Payer: Self-pay | Admitting: Pulmonary Disease

## 2022-08-28 ENCOUNTER — Ambulatory Visit (INDEPENDENT_AMBULATORY_CARE_PROVIDER_SITE_OTHER): Payer: Medicaid Other | Admitting: Pulmonary Disease

## 2022-08-28 VITALS — BP 132/78 | HR 118 | Temp 97.6°F | Ht 66.0 in | Wt 211.6 lb

## 2022-08-28 DIAGNOSIS — J9611 Chronic respiratory failure with hypoxia: Secondary | ICD-10-CM

## 2022-08-28 DIAGNOSIS — J432 Centrilobular emphysema: Secondary | ICD-10-CM | POA: Diagnosis not present

## 2022-08-28 MED ORDER — AZELASTINE HCL 0.15 % NA SOLN: 1.0000 | Freq: Every morning | NASAL | 5 refills | Status: DC

## 2022-08-28 MED ORDER — BREZTRI AEROSPHERE 160-9-4.8 MCG/ACT IN AERO: 2.0000 | INHALATION_SPRAY | Freq: Two times a day (BID) | RESPIRATORY_TRACT | 5 refills | Status: DC

## 2022-08-28 NOTE — Telephone Encounter (Signed)
PA request received through [place] in CoverMyMeds.  PA submitted and awaiting response.   Key: [id number]

## 2022-08-28 NOTE — Progress Notes (Signed)
[facility]  Chief Complaint  Patient presents with   Follow-up    Breathing is doing well most days  Wants to discuss getting a new concentrator and POC    Constitutional:  BP 132/78 (BP Location: Right Arm)   Pulse (!) 118   Temp 97.6 F (36.4 C) (Temporal)   Ht '5\' 6"'$  (1.676 m)   Wt 211 lb 9.6 oz (96 kg)   SpO2 90% Comment: ra  BMI 34.15 kg/m   Past Medical History:  Polycythemia, Allergies, Vit D deficiency, Influenza B February 2019, ETOH, Hypertension, B12 deficiency, GERD  Past Surgical History:  Her  has a past surgical history that includes Cesarean section.  Brief Summary:  Emily Fitzgerald is a 61 y.o. female former smoker with COPD and chronic respiratory failure with hypoxia and hypercapnia.       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  She has more trouble with allergies.  Getting sinus congestion and pressure with post nasal drip.  Not having wheeze, sputum, or chest discomfort.  Using oxygen at night and some during the day.  Needs a new concentrator.  She got her current set up about 7 years ago.  Hb from 07/11/22 was 16.9.  Physical Exam:   Appearance - well kempt   ENMT - no sinus tenderness, no oral exudate, no LAN, Mallampati 3 airway, no stridor  Respiratory - equal breath sounds bilaterally, no wheezing or rales  CV - s1s2 regular rate and rhythm, no murmurs  Ext - no clubbing, no edema  Skin - no rashes  Psych - normal mood and affect      Pulmonary testing:  A1AT 09/09/20 >> 125, MM ABG 11/14/21 >> pH 7.308, PCO2 60.2, PO2 69.2 PFT 04/03/22 >> FEV1 0.95 (34%), FEV1% 47, TLC 7.48 (139%), DLCO 62%  Chest Imaging:  CT angio chest 11/14/21 >> enlarged mediastinal LN, bronchial wall thickening, tree in bud diffusely CT chest 04/09/22 >> resolution of ASD and adenopathy  Sleep Tests:  ONO with RA 11/14/20 >> test time 10 hrs 40 min.  Baseline SpO2 82%, low SpO2 59%.   Spent 9 hrs 54 min with SpO2 < 88%.  Cardiac Tests:  Echo 09/15/20 >> EF 60 to 65%, mild LVH, grade 1 DD  Social History:  She  reports that she quit smoking about 9 months ago. Her smoking use included cigarettes. She has a 18.50 pack-year smoking history. She has never used smokeless tobacco. She reports current alcohol use. She reports that she does not use drugs.  Family History:  Her family history includes CAD in her brother; Cancer in her mother; Leukemia in her father.    Assessment/Plan:   Severe COPD with emphysema. - will change from symbicort to breztri two puffs bid - prn albuterol - she has a nebulizer also  Chronic respiratory failure with hypoxia and hypercapnia. - 3 liters at night and with exertion - goal SpO2 > 90% - she would like to switch to an Inogen device if she can; if not, then she would continue with supplies through [employer]  Rt breast abnormalities. - atypical ductal hyperplasia and radial scarring - followed by Dr. Lindalou Hose with [facility] at [facility] polycythemia. - followed by Dr. Silvestre Mesi at [facility]   Time Spent Involved in Patient Care on Day of Examination:  37 minutes  Follow up:   Patient Instructions  Bretzri two puffs in the  morning and two puffs in the evening, and rinse your mouth after each use.  Stop using symbicort once you start using breztri.  Astepro 1 spray in each nostril in the morning as needed for allergies.  You can use saline sinus rinse like NeilMed or Ayr to help clear sinus congestion before using astepro.  Will see if you can get an Inogen oxygen concentrator.  Follow up in 6 months.  Medication List:   Allergies as of 08/28/2022   No Known Allergies      Medication List        Accurate as of August 28, 2022 12:00 PM. If you have any questions, ask your nurse or doctor.          STOP taking these medications    cyanocobalamin 2000 MCG tablet Stopped by:  Chesley Mires, MD   Symbicort 80-4.5 MCG/ACT inhaler Generic drug: budesonide-formoterol Stopped by: Chesley Mires, MD   thiamine 100 MG tablet Commonly known as: VITAMIN B1 Stopped by: Chesley Mires, MD       TAKE these medications    albuterol 108 (90 Base) MCG/ACT inhaler Commonly known as: VENTOLIN HFA Inhale 1-2 puffs into the lungs every 6 (six) hours as needed for wheezing or shortness of breath.   amLODipine 5 MG tablet Commonly known as: NORVASC Take 1 tablet (5 mg total) by mouth daily.   Azelastine HCl 0.15 % Soln Commonly known as: Astepro Place 1 spray into the nose every morning. Started by: Chesley Mires, MD   Arnell Sieving [phone] MCG/ACT Aero Generic drug: Budeson-Glycopyrrol-Formoterol Inhale 2 puffs into the lungs in the morning and at bedtime. Started by: Chesley Mires, MD   folic acid 1 MG tablet Commonly known as: FOLVITE Take 1 tablet (1 mg total) by mouth daily.   gabapentin 100 MG capsule Commonly known as: NEURONTIN Take 100 mg by mouth 3 (three) times daily as needed (for pain).   ipratropium-albuterol 0.5-2.5 (3) MG/3ML Soln Commonly known as: DUONEB Take 3 mLs by nebulization in the morning, at noon, and at bedtime.   multivitamin with minerals Tabs tablet Take 1 tablet by mouth daily.        Signature:  Chesley Mires, MD [place] Pager - [phone] 08/28/2022, 12:00 PM

## 2022-08-28 NOTE — Patient Instructions (Signed)
Bretzri two puffs in the morning and two puffs in the evening, and rinse your mouth after each use.  Stop using symbicort once you start using breztri.  Astepro 1 spray in each nostril in the morning as needed for allergies.  You can use saline sinus rinse like NeilMed or Ayr to help clear sinus congestion before using astepro.  Will see if you can get an Inogen oxygen concentrator.  Follow up in 6 months.

## 2022-09-04 ENCOUNTER — Telehealth: Payer: Self-pay | Admitting: Pulmonary Disease

## 2022-09-04 DIAGNOSIS — J9611 Chronic respiratory failure with hypoxia: Secondary | ICD-10-CM

## 2022-09-04 NOTE — Telephone Encounter (Signed)
Re entered order with new O2 format. Nothing further needed

## 2022-09-05 ENCOUNTER — Other Ambulatory Visit (HOSPITAL_COMMUNITY): Payer: Self-pay

## 2022-09-05 NOTE — Telephone Encounter (Signed)
PA approved. Per test claim filled 09/01/2022.

## 2022-10-16 ENCOUNTER — Ambulatory Visit (HOSPITAL_COMMUNITY)
Admission: RE | Admit: 2022-10-16 | Discharge: 2022-10-16 | Disposition: A | Discharge status: Home / Self Care | Payer: Medicaid Other | Source: Ambulatory Visit | Attending: Family Medicine | Admitting: Family Medicine

## 2022-10-16 ENCOUNTER — Encounter (HOSPITAL_COMMUNITY): Payer: Self-pay

## 2022-10-16 DIAGNOSIS — R921 Mammographic calcification found on diagnostic imaging of breast: Secondary | ICD-10-CM

## 2022-10-30 ENCOUNTER — Emergency Department (HOSPITAL_COMMUNITY): Payer: Medicaid Other

## 2022-10-30 ENCOUNTER — Encounter (HOSPITAL_COMMUNITY): Payer: Self-pay

## 2022-10-30 ENCOUNTER — Inpatient Hospital Stay (HOSPITAL_COMMUNITY)
Admission: EM | Admit: 2022-10-30 | Discharge: 2022-11-04 | DRG: 189 | Disposition: A | Discharge status: Home / Self Care | Payer: Medicaid Other | Attending: Family Medicine | Admitting: Family Medicine

## 2022-10-30 DIAGNOSIS — J449 Chronic obstructive pulmonary disease, unspecified: Secondary | ICD-10-CM | POA: Diagnosis present

## 2022-10-30 DIAGNOSIS — K219 Gastro-esophageal reflux disease without esophagitis: Secondary | ICD-10-CM | POA: Diagnosis present

## 2022-10-30 DIAGNOSIS — R0602 Shortness of breath: Secondary | ICD-10-CM | POA: Diagnosis present

## 2022-10-30 DIAGNOSIS — E8729 Other acidosis: Secondary | ICD-10-CM | POA: Diagnosis present

## 2022-10-30 DIAGNOSIS — Z7952 Long term (current) use of systemic steroids: Secondary | ICD-10-CM | POA: Diagnosis not present

## 2022-10-30 DIAGNOSIS — J9621 Acute and chronic respiratory failure with hypoxia: Principal | ICD-10-CM | POA: Diagnosis present

## 2022-10-30 DIAGNOSIS — J440 Chronic obstructive pulmonary disease with acute lower respiratory infection: Secondary | ICD-10-CM | POA: Diagnosis present

## 2022-10-30 DIAGNOSIS — F17210 Nicotine dependence, cigarettes, uncomplicated: Secondary | ICD-10-CM | POA: Diagnosis present

## 2022-10-30 DIAGNOSIS — Z79899 Other long term (current) drug therapy: Secondary | ICD-10-CM

## 2022-10-30 DIAGNOSIS — J441 Chronic obstructive pulmonary disease with (acute) exacerbation: Secondary | ICD-10-CM | POA: Diagnosis present

## 2022-10-30 DIAGNOSIS — Z720 Tobacco use: Secondary | ICD-10-CM | POA: Diagnosis present

## 2022-10-30 DIAGNOSIS — D45 Polycythemia vera: Secondary | ICD-10-CM | POA: Diagnosis present

## 2022-10-30 DIAGNOSIS — Z6833 Body mass index (BMI) 33.0-33.9, adult: Secondary | ICD-10-CM

## 2022-10-30 DIAGNOSIS — J189 Pneumonia, unspecified organism: Secondary | ICD-10-CM | POA: Diagnosis present

## 2022-10-30 DIAGNOSIS — Z20822 Contact with and (suspected) exposure to covid-19: Secondary | ICD-10-CM | POA: Diagnosis present

## 2022-10-30 DIAGNOSIS — I10 Essential (primary) hypertension: Secondary | ICD-10-CM | POA: Diagnosis present

## 2022-10-30 DIAGNOSIS — Z9981 Dependence on supplemental oxygen: Secondary | ICD-10-CM | POA: Diagnosis not present

## 2022-10-30 DIAGNOSIS — E669 Obesity, unspecified: Secondary | ICD-10-CM | POA: Diagnosis present

## 2022-10-30 DIAGNOSIS — Z8619 Personal history of other infectious and parasitic diseases: Secondary | ICD-10-CM

## 2022-10-30 DIAGNOSIS — Z8249 Family history of ischemic heart disease and other diseases of the circulatory system: Secondary | ICD-10-CM

## 2022-10-30 LAB — BRAIN NATRIURETIC PEPTIDE: B Natriuretic Peptide: 567 pg/mL — ABNORMAL HIGH (ref 0.0–100.0)

## 2022-10-30 LAB — PROTIME-INR
INR: 0.9 (ref 0.8–1.2)
Prothrombin Time: 12.2 seconds (ref 11.4–15.2)

## 2022-10-30 LAB — URINALYSIS, ROUTINE W REFLEX MICROSCOPIC
Bacteria, UA: NONE SEEN
Bilirubin Urine: NEGATIVE
Glucose, UA: NEGATIVE mg/dL
Hgb urine dipstick: NEGATIVE
Ketones, ur: 5 mg/dL — AB
Leukocytes,Ua: NEGATIVE
Nitrite: NEGATIVE
Protein, ur: 100 mg/dL — AB
Specific Gravity, Urine: 1.026 (ref 1.005–1.030)
pH: 5 (ref 5.0–8.0)

## 2022-10-30 LAB — RESP PANEL BY RT-PCR (FLU A&B, COVID) ARPGX2
Influenza A by PCR: NEGATIVE
Influenza B by PCR: NEGATIVE
SARS Coronavirus 2 by RT PCR: NEGATIVE

## 2022-10-30 LAB — BLOOD GAS, VENOUS
Acid-Base Excess: 8 mmol/L — ABNORMAL HIGH (ref 0.0–2.0)
Bicarbonate: 38.1 mmol/L — ABNORMAL HIGH (ref 20.0–28.0)
Drawn by: 65579
O2 Saturation: 86.5 %
Patient temperature: 37
pCO2, Ven: 74 mmHg (ref 44–60)
pH, Ven: 7.32 (ref 7.25–7.43)
pO2, Ven: 58 mmHg — ABNORMAL HIGH (ref 32–45)

## 2022-10-30 LAB — CBC WITH DIFFERENTIAL/PLATELET
Abs Immature Granulocytes: 0.07 10*3/uL (ref 0.00–0.07)
Basophils Absolute: 0.1 10*3/uL (ref 0.0–0.1)
Basophils Relative: 1 %
Eosinophils Absolute: 0.3 10*3/uL (ref 0.0–0.5)
Eosinophils Relative: 3 %
HCT: 53.4 % — ABNORMAL HIGH (ref 36.0–46.0)
Hemoglobin: 17.5 g/dL — ABNORMAL HIGH (ref 12.0–15.0)
Immature Granulocytes: 1 %
Lymphocytes Relative: 42 %
Lymphs Abs: 4.6 10*3/uL — ABNORMAL HIGH (ref 0.7–4.0)
MCH: 33.2 pg (ref 26.0–34.0)
MCHC: 32.8 g/dL (ref 30.0–36.0)
MCV: 101.3 fL — ABNORMAL HIGH (ref 80.0–100.0)
Monocytes Absolute: 0.4 10*3/uL (ref 0.1–1.0)
Monocytes Relative: 4 %
Neutro Abs: 5.4 10*3/uL (ref 1.7–7.7)
Neutrophils Relative %: 49 %
Platelets: 223 10*3/uL (ref 150–400)
RBC: 5.27 MIL/uL — ABNORMAL HIGH (ref 3.87–5.11)
RDW: 13.2 % (ref 11.5–15.5)
WBC: 10.8 10*3/uL — ABNORMAL HIGH (ref 4.0–10.5)
nRBC: 0 % (ref 0.0–0.2)

## 2022-10-30 LAB — BASIC METABOLIC PANEL
Anion gap: 10 (ref 5–15)
BUN: 9 mg/dL (ref 6–20)
CO2: 31 mmol/L (ref 22–32)
Calcium: 9.2 mg/dL (ref 8.9–10.3)
Chloride: 98 mmol/L (ref 98–111)
Creatinine, Ser: 0.59 mg/dL (ref 0.44–1.00)
GFR, Estimated: >60 mL/min (ref >60)
Glucose, Bld: 110 mg/dL — ABNORMAL HIGH (ref 70–99)
Potassium: 4.4 mmol/L (ref 3.5–5.1)
Sodium: 139 mmol/L (ref 135–145)

## 2022-10-30 LAB — PROCALCITONIN: Procalcitonin: <0.1 ng/mL

## 2022-10-30 LAB — APTT: aPTT: 25 seconds (ref 24–36)

## 2022-10-30 LAB — LACTIC ACID, PLASMA
Lactic Acid, Venous: 1 mmol/L (ref 0.5–1.9)
Lactic Acid, Venous: 0.8 mmol/L (ref 0.5–1.9)

## 2022-10-30 MED ORDER — GUAIFENESIN-CODEINE 100-10 MG/5ML PO SOLN
10.0000 mL | Freq: Once | ORAL | Status: AC
  Administered 2022-10-30: 10 mL via ORAL
  Filled 2022-10-30: qty 10

## 2022-10-30 MED ORDER — ALBUTEROL SULFATE (2.5 MG/3ML) 0.083% IN NEBU
INHALATION_SOLUTION | RESPIRATORY_TRACT | Status: AC
  Administered 2022-10-30: 2.5 mg via RESPIRATORY_TRACT
  Filled 2022-10-30: qty 3

## 2022-10-30 MED ORDER — METHYLPREDNISOLONE SODIUM SUCC 125 MG IJ SOLR
80.0000 mg | Freq: Two times a day (BID) | INTRAMUSCULAR | Status: AC
  Administered 2022-10-31 (×2): 80 mg via INTRAVENOUS
  Filled 2022-10-30 (×2): qty 2

## 2022-10-30 MED ORDER — ALBUTEROL SULFATE (2.5 MG/3ML) 0.083% IN NEBU
2.5000 mg | INHALATION_SOLUTION | Freq: Once | RESPIRATORY_TRACT | Status: AC
  Administered 2022-10-30: 2.5 mg via RESPIRATORY_TRACT

## 2022-10-30 MED ORDER — ONDANSETRON HCL 4 MG PO TABS: 4.0000 mg | ORAL_TABLET | Freq: Four times a day (QID) | ORAL | Status: DC | PRN

## 2022-10-30 MED ORDER — ACETAMINOPHEN 325 MG PO TABS
650.0000 mg | ORAL_TABLET | Freq: Four times a day (QID) | ORAL | Status: DC | PRN
  Administered 2022-10-31 – 2022-11-04 (×6): 650 mg via ORAL
  Filled 2022-10-30 (×6): qty 2

## 2022-10-30 MED ORDER — SODIUM CHLORIDE 0.9 % IV SOLN
1.0000 g | INTRAVENOUS | Status: DC
  Administered 2022-10-31 – 2022-11-01 (×2): 1 g via INTRAVENOUS
  Filled 2022-10-30 (×2): qty 10

## 2022-10-30 MED ORDER — SODIUM CHLORIDE 0.9 % IV SOLN
1.0000 g | Freq: Once | INTRAVENOUS | Status: AC
  Administered 2022-10-30: 1 g via INTRAVENOUS
  Filled 2022-10-30: qty 10

## 2022-10-30 MED ORDER — SODIUM CHLORIDE 0.9 % IV SOLN
500.0000 mg | INTRAVENOUS | Status: DC
  Administered 2022-10-31: 500 mg via INTRAVENOUS
  Filled 2022-10-30: qty 5

## 2022-10-30 MED ORDER — POLYETHYLENE GLYCOL 3350 17 G PO PACK: 17.0000 g | PACK | Freq: Every day | ORAL | Status: DC | PRN

## 2022-10-30 MED ORDER — ACETAMINOPHEN 500 MG PO TABS
1000.0000 mg | ORAL_TABLET | Freq: Once | ORAL | Status: AC
  Administered 2022-10-30: 1000 mg via ORAL
  Filled 2022-10-30: qty 2

## 2022-10-30 MED ORDER — PREDNISONE 20 MG PO TABS: 40.0000 mg | ORAL_TABLET | Freq: Every day | ORAL | Status: DC

## 2022-10-30 MED ORDER — MOMETASONE FURO-FORMOTEROL FUM 100-5 MCG/ACT IN AERO
2.0000 | INHALATION_SPRAY | Freq: Two times a day (BID) | RESPIRATORY_TRACT | Status: DC
  Administered 2022-10-30 – 2022-11-04 (×10): 2 via RESPIRATORY_TRACT
  Filled 2022-10-30 (×2): qty 8.8

## 2022-10-30 MED ORDER — ENOXAPARIN SODIUM 40 MG/0.4ML IJ SOSY
40.0000 mg | PREFILLED_SYRINGE | INTRAMUSCULAR | Status: DC
  Administered 2022-10-30 – 2022-11-03 (×5): 40 mg via SUBCUTANEOUS
  Filled 2022-10-30 (×5): qty 0.4

## 2022-10-30 MED ORDER — GABAPENTIN 100 MG PO CAPS: 100.0000 mg | ORAL_CAPSULE | Freq: Three times a day (TID) | ORAL | Status: DC | PRN

## 2022-10-30 MED ORDER — LACTATED RINGERS IV SOLN: INTRAVENOUS | Status: DC

## 2022-10-30 MED ORDER — CHLORHEXIDINE GLUCONATE CLOTH 2 % EX PADS: 6.0000 | MEDICATED_PAD | Freq: Every day | CUTANEOUS | Status: DC

## 2022-10-30 MED ORDER — ONDANSETRON HCL 4 MG/2ML IJ SOLN: 4.0000 mg | Freq: Four times a day (QID) | INTRAMUSCULAR | Status: DC | PRN

## 2022-10-30 MED ORDER — IPRATROPIUM-ALBUTEROL 0.5-2.5 (3) MG/3ML IN SOLN
3.0000 mL | RESPIRATORY_TRACT | Status: DC | PRN
  Administered 2022-11-01 – 2022-11-03 (×3): 3 mL via RESPIRATORY_TRACT
  Filled 2022-10-30 (×5): qty 3

## 2022-10-30 MED ORDER — MAGNESIUM SULFATE 2 GM/50ML IV SOLN
2.0000 g | Freq: Once | INTRAVENOUS | Status: AC
  Administered 2022-10-30: 2 g via INTRAVENOUS
  Filled 2022-10-30: qty 50

## 2022-10-30 MED ORDER — METHYLPREDNISOLONE SODIUM SUCC 125 MG IJ SOLR
125.0000 mg | Freq: Once | INTRAMUSCULAR | Status: AC
  Administered 2022-10-30: 125 mg via INTRAVENOUS
  Filled 2022-10-30: qty 2

## 2022-10-30 MED ORDER — ACETAMINOPHEN 650 MG RE SUPP: 650.0000 mg | Freq: Four times a day (QID) | RECTAL | Status: DC | PRN

## 2022-10-30 MED ORDER — GUAIFENESIN-DM 100-10 MG/5ML PO SYRP
10.0000 mL | ORAL_SOLUTION | Freq: Three times a day (TID) | ORAL | Status: AC
  Administered 2022-10-31 – 2022-11-01 (×5): 10 mL via ORAL
  Filled 2022-10-30 (×5): qty 10

## 2022-10-30 MED ORDER — IPRATROPIUM-ALBUTEROL 0.5-2.5 (3) MG/3ML IN SOLN: 3.0000 mL | Freq: Once | RESPIRATORY_TRACT | Status: AC

## 2022-10-30 MED ORDER — IPRATROPIUM-ALBUTEROL 0.5-2.5 (3) MG/3ML IN SOLN
RESPIRATORY_TRACT | Status: AC
  Administered 2022-10-30: 3 mL via RESPIRATORY_TRACT
  Filled 2022-10-30: qty 3

## 2022-10-30 MED ORDER — SODIUM CHLORIDE 0.9 % IV SOLN
500.0000 mg | Freq: Once | INTRAVENOUS | Status: AC
  Administered 2022-10-30: 500 mg via INTRAVENOUS
  Filled 2022-10-30: qty 5

## 2022-10-30 MED ORDER — IPRATROPIUM-ALBUTEROL 0.5-2.5 (3) MG/3ML IN SOLN
3.0000 mL | Freq: Four times a day (QID) | RESPIRATORY_TRACT | Status: AC
  Administered 2022-10-30 – 2022-10-31 (×5): 3 mL via RESPIRATORY_TRACT
  Filled 2022-10-30 (×5): qty 3

## 2022-10-30 NOTE — ED Triage Notes (Signed)
Pt BIBA from home. Pt c/o SHOB, hx COPD. Pt was 70% on RA. Pt was placed on NRB at 15L to 98%. Pt has Insp wheezing, decresead left upper, rhonchi right Albuterol neb at 0800, nothing since.   NSR/ST with EMS.   CBG 129  99.3

## 2022-10-30 NOTE — Assessment & Plan Note (Addendum)
-   Elevated BP-improving Systolic 437C to 052B. -Norvasc and losartan listed on chart, patient does not appear to be taking these -Restarted Norvasc at 10 mg daily

## 2022-10-30 NOTE — ED Provider Notes (Signed)
[facility] EMERGENCY DEPARTMENT Provider Note   CSN: [id number] Arrival date & time: 10/30/22  1552     History  Chief Complaint  Patient presents with   Shortness of Breath    Emily Fitzgerald is a 61 y.o. female.  The history is provided by the patient, the EMS personnel and medical records. No language interpreter was used.  Shortness of Breath    Emily Fitzgerald is a 61 y.o. female with a history of COPD and GERD who presents with SOB. Pt states that she has been feeling SOB for 2 days. Typically she uses breathing treatments Q6H, but missed her 2pm treatment today. Pt also states that she began smoking 1/2ppd. Endorses productive cough with white sputum, decreased appetite, and some mild weakness. Pt denies recent sick contacts, but states that she had an ear infection 3 weeks ago for which she was given Amoxicillin. Pt endorses nausea, but denies fever, and vomiting.   Home Medications Prior to Admission medications   Medication Sig Start Date End Date Taking? Authorizing Provider  albuterol (VENTOLIN HFA) 108 (90 Base) MCG/ACT inhaler Inhale 1-2 puffs into the lungs every 6 (six) hours as needed for wheezing or shortness of breath. 07/17/21   Chesley Mires, MD  amLODipine (NORVASC) 5 MG tablet Take 1 tablet (5 mg total) by mouth daily. 11/21/21   Johnson, Clanford L, MD  Azelastine HCl (ASTEPRO) 0.15 % SOLN Place 1 spray into the nose every morning. 08/28/22   Chesley Mires, MD  Budeson-Glycopyrrol-Formoterol (BREZTRI AEROSPHERE) 160-9-4.8 MCG/ACT AERO Inhale 2 puffs into the lungs in the morning and at bedtime. 08/28/22   Chesley Mires, MD  folic acid (FOLVITE) 1 MG tablet Take 1 tablet (1 mg total) by mouth daily. 11/21/21   Johnson, Clanford L, MD  gabapentin (NEURONTIN) 100 MG capsule Take 100 mg by mouth 3 (three) times daily as needed (for pain).  10/02/18   [provider]  ipratropium-albuterol (DUONEB) 0.5-2.5 (3) MG/3ML SOLN Take 3 mLs by nebulization in the morning, at  noon, and at bedtime. 07/17/21   Chesley Mires, MD  Multiple Vitamin (MULTIVITAMIN WITH MINERALS) TABS tablet Take 1 tablet by mouth daily. 11/21/21   Murlean Iba, MD      Allergies    Patient has no known allergies.    Review of Systems   Review of Systems  Respiratory:  Positive for shortness of breath.   All other systems reviewed and are negative.   Physical Exam Updated Vital Signs BP (!) 176/81 (BP Location: Left Arm)   Pulse (!) 111   Temp 98.6 F (37 C) (Oral)   Resp (!) 22   Ht '5\' 6"'$  (1.676 m)   Wt 95.3 kg   SpO2 92%   BMI 33.89 kg/m  Physical Exam Vitals and nursing note reviewed.  Constitutional:      General: She is not in acute distress.    Appearance: She is well-developed.  HENT:     Head: Atraumatic.  Eyes:     Conjunctiva/sclera: Conjunctivae normal.  Cardiovascular:     Rate and Rhythm: Tachycardia present.  Pulmonary:     Effort: Pulmonary effort is normal. Tachypnea present. No respiratory distress.     Breath sounds: No stridor. Decreased breath sounds and wheezing present. No rhonchi or rales.  Chest:     Chest wall: No tenderness.  Musculoskeletal:     Cervical back: Neck supple.     Right lower leg: No edema.     Left lower leg:  No edema.  Skin:    Capillary Refill: Capillary refill takes less than 2 seconds.     Findings: No rash.  Neurological:     Mental Status: She is alert and oriented to person, place, and time.  Psychiatric:        Mood and Affect: Mood normal.     ED Results / Procedures / Treatments   Labs (all labs ordered are listed, but only abnormal results are displayed) Labs Reviewed  BASIC METABOLIC PANEL - Abnormal; Notable for the following components:      Result Value   Glucose, Bld 110 (*)    All other components within normal limits  BRAIN NATRIURETIC PEPTIDE - Abnormal; Notable for the following components:   B Natriuretic Peptide 567.0 (*)    All other components within normal limits  CBC WITH  DIFFERENTIAL/PLATELET - Abnormal; Notable for the following components:   WBC 10.8 (*)    RBC 5.27 (*)    Hemoglobin 17.5 (*)    HCT 53.4 (*)    MCV 101.3 (*)    Lymphs Abs 4.6 (*)    All other components within normal limits  BLOOD GAS, VENOUS - Abnormal; Notable for the following components:   pCO2, Ven 74 (*)    pO2, Ven 58 (*)    Bicarbonate 38.1 (*)    Acid-Base Excess 8.0 (*)    All other components within normal limits  CULTURE, BLOOD (ROUTINE X 2)  CULTURE, BLOOD (ROUTINE X 2)  RESP PANEL BY RT-PCR (FLU A&B, COVID) ARPGX2  URINE CULTURE  LACTIC ACID, PLASMA  PROTIME-INR  APTT  LACTIC ACID, PLASMA  URINALYSIS, ROUTINE W REFLEX MICROSCOPIC  POC URINE PREG, ED    EKG EKG Interpretation  Date/Time:  Tuesday October 30 2022 16:08:46 EST Ventricular Rate:  109 PR Interval:  154 QRS Duration: 84 QT Interval:  336 QTC Calculation: 453 R Axis:   252 Text Interpretation: Sinus tachycardia Probable left atrial enlargement Left anterior fascicular block Anteroseptal infarct, age indeterminate no changes since 12/22 Confirmed by Noemi Chapel [phone]) on 10/30/2022 4:11:04 PM  Radiology DG Chest Port 1 View  Result Date: 10/30/2022 CLINICAL DATA:  Shortness of breath. EXAM: PORTABLE CHEST 1 VIEW COMPARISON:  Radiograph 11/13/2021, CT 04/06/2022 FINDINGS: Stable heart size and mediastinal contours. Mild hyperinflation and bronchial thickening. Minimal ill-defined patchy opacity in the periphery of the right lung base. Mild scarring in the periphery of the left lung base. No pulmonary edema, pleural effusion or pneumothorax. On limited assessment, no acute osseous findings. IMPRESSION: 1. Minimal ill-defined patchy opacity in the periphery of the right lung base, may be atelectasis or pneumonia. 2. Mild hyperinflation and bronchial thickening, likely sequela of smoking. Electronically Signed   By: Keith Rake M.D.   On: 10/30/2022 16:33    Procedures .Critical  Care  Performed by: Domenic Moras, PA-C Authorized by: Domenic Moras, PA-C   Critical care provider statement:    Critical care time (minutes):  30   Critical care was time spent personally by me on the following activities:  Development of treatment plan with patient or surrogate, discussions with consultants, evaluation of patient's response to treatment, examination of patient, ordering and review of laboratory studies, ordering and review of radiographic studies, ordering and performing treatments and interventions, pulse oximetry, re-evaluation of patient's condition and review of old charts     Medications Ordered in ED Medications  magnesium sulfate IVPB 2 g 50 mL (2 g Intravenous New Bag/Given 10/30/22 1657)  cefTRIAXone (  ROCEPHIN) 1 g in sodium chloride 0.9 % 100 mL IVPB (1 g Intravenous New Bag/Given 10/30/22 1720)  azithromycin (ZITHROMAX) 500 mg in sodium chloride 0.9 % 250 mL IVPB (500 mg Intravenous New Bag/Given 10/30/22 1733)  lactated ringers infusion ( Intravenous New Bag/Given 10/30/22 1732)  albuterol (PROVENTIL) (2.5 MG/3ML) 0.083% nebulizer solution 2.5 mg (2.5 mg Nebulization Given 10/30/22 1618)  ipratropium-albuterol (DUONEB) 0.5-2.5 (3) MG/3ML nebulizer solution 3 mL (3 mLs Nebulization Given 10/30/22 1613)  methylPREDNISolone sodium succinate (SOLU-MEDROL) 125 mg/2 mL injection 125 mg (125 mg Intravenous Given 10/30/22 1652)  acetaminophen (TYLENOL) tablet 1,000 mg (1,000 mg Oral Given 10/30/22 1736)    ED Course/ Medical Decision Making/ A&P                           Medical Decision Making Amount and/or Complexity of Data Reviewed Labs: ordered. Radiology: ordered.  Risk OTC drugs. Prescription drug management. Decision regarding hospitalization.   BP (!) 176/81 (BP Location: Left Arm)   Pulse (!) 111   Temp 98.6 F (37 C) (Oral)   Resp (!) 22   Ht '5\' 6"'$  (1.676 m)   Wt 95.3 kg   SpO2 95%   BMI 33.89 kg/m   4:25 PM Giamarie Bueche is a 61 y.o. female with a  history of COPD and GERD who presents with SOB. Pt states that she has been feeling SOB for 2 days. Typically she uses breathing treatments Q6H, but missed her 2pm treatment today. Pt also states that she began smoking 1/2ppd. Endorses productive cough with white sputum, decreased appetite, and some mild weakness. Pt denies recent sick contacts, but states that she had an ear infection 3 weeks ago for which she was given Amoxicillin. Pt endorses nausea, but denies fever, and vomiting.  She also endorsed having headache, body aches, myalgias, and decreased appetite.  She normally wears supplemental oxygen at home at 3 L   On exam this is a chronically ill obese female wearing a nonrebreather with moderate respiratory discomfort.  She is tachypneic, tachycardic, with expiratory wheezes heard and decreased lung sounds throughout.  She does not appear to be fluid overloaded.  She is able to speak with 3-5 words per sentence.  This patient presents to the ED for concern of sob, this involves an extensive number of treatment options, and is a complaint that carries with it a high risk of complications and morbidity.  The differential diagnosis includes sepsis, covid, flu, copd exacerbation, PE, ACS, viral illness, chf  Co morbidities that complicate the patient evaluation COPD, polycythemia vera, tobacco use Additional history obtained:  Additional history obtained from EMS External records from outside source obtained and reviewed including EMR including prior labs and imaging  Lab Tests:  I Ordered, and personally interpreted labs.  The pertinent results include:  PCO2 of 74 which reflects respiratory acidosis.  BNP 567, an improvement from prior value.  WBC 10.8, non specific but could reflect infectious cause  Imaging Studies ordered:  I ordered imaging studies including CXR I independently visualized and interpreted imaging which showed miminal ill-defined pathcy opacity in the R lung base I  agree with the radiologist interpretation  Cardiac Monitoring:  The patient was maintained on a cardiac monitor.  I personally viewed and interpreted the cardiac monitored which showed an underlying rhythm of: sinus tachycardia  Medicines ordered and prescription drug management:  I ordered medication including IVF, abx, steroid, duonebs  for COPD exacerbation in the setting  of lung infection Reevaluation of the patient after these medicines showed that the patient improved I have reviewed the patients home medicines and have made adjustments as needed  Test Considered: as above   Critical Interventions: code sepsis  Consultations Obtained:  I requested consultation with the hospitalist DR. Emokpae,  and discussed lab and imaging findings as well as pertinent plan - they recommend: admission   Problem List / ED Course: SOB  Cold sxs  Reevaluation:  After the interventions noted above, I reevaluated the patient and found that they have :improved  Social Determinants of Health: tobacco use, recommend cessation  Dispostion:  After consideration of the diagnostic results and the patients response to treatment, I feel that the patent would benefit from admission.   6:12 PM After receiving mental oxygen, magnesium, Solu-Medrol, and continuous nebs, patient reported feeling a bit better.  O2 sats improved to 91% on 5 L of supplemental oxygen.  Lung aeration appears mildly improved.  Given chest x-ray shows signs of pneumonia and patient has cold symptoms and having progressive worsening shortness of breath despite home treatment, I have consulted with [facility] hospitalist who agrees to see and will admit patient to stepdown for further management of her condition.  Patient is agreeable with plan.  Care discussed with Dr. Sabra Heck        Final Clinical Impression(s) / ED Diagnoses Final diagnoses:  Acute on chronic hypoxic respiratory failure [facility])  Community acquired pneumonia,  unspecified laterality    Rx / DC Orders ED Discharge Orders     None         Domenic Moras, PA-C 10/30/22 1814    Noemi Chapel, MD 10/30/22 2027

## 2022-10-30 NOTE — H&P (Signed)
History and Physical    Emily Fitzgerald YIF:[id number] DOB: July 01, 1961 DOA: 10/30/2022  PCP: Gwenlyn Saran [place]   Patient coming from: Home  I have personally briefly reviewed patient's old medical records in [place]  Chief Complaint: Difficulty breathing  HPI: Emily Fitzgerald is a 61 y.o. 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  Patient presented to the ED with complaints of difficulty breathing, and worsening cough and wheezing over the past 2 days, typical of her COPD exacerbation and pneumonia.  She reports cough productive of whitish sputum.  No chest pain.  No lower extremity swelling.  She reports about 2 weeks ago she was diagnosed with an ear infection and completed course of amoxicillin. Over the past 2 days she has also had bodyaches, nasal congestion, sore throat and headaches.  ED Course: Tmax 98.6.  Heart rate 97- 111, rate rate 19-22.  Blood pressure systolic 867-672.  O2 sats 70% on home 3 L.  She was initially placed on 8 L nasal cannula then switched to high flow nasal cannula at 6 L. Patient wheezing diffusely.  WBC 10.8.  BNP- 567.  VBG showed pH of 7.3, pCO2 of 74.  Reviewed and influenza negative.  Chest x-ray showed ill-defined patchy opacity right lung base-atelectasis or pneumonia.  DuoNebs, magnesium, Solu-Medrol 125, ceftriaxone and azithromycin given.  Review of Systems: As per HPI all other systems reviewed and negative.  Past Medical History:  Diagnosis Date   COPD (chronic obstructive pulmonary disease) ([place])    Influenza B 12/2017   Polycythemia    Seasonal allergies    Vitamin D deficiency     Past Surgical History:  Procedure Laterality Date   CESAREAN SECTION       reports that she has been smoking cigarettes. She has a 18.50 pack-year smoking history. She has never used smokeless tobacco. She reports current alcohol use. She reports that she does not use drugs.  No Known  Allergies  Family History  Problem Relation Age of Onset   Cancer Mother    Leukemia Father    CAD Brother     Prior to Admission medications   Medication Sig Start Date End Date Taking? Authorizing Provider  albuterol (VENTOLIN HFA) 108 (90 Base) MCG/ACT inhaler Inhale 1-2 puffs into the lungs every 6 (six) hours as needed for wheezing or shortness of breath. Patient taking differently: Inhale 2 puffs into the lungs every 6 (six) hours as needed for wheezing or shortness of breath. 07/17/21  Yes Chesley Mires, MD  budesonide-formoterol (SYMBICORT) 80-4.5 MCG/ACT inhaler Inhale 2 puffs into the lungs 2 (two) times daily.   Yes [provider]  gabapentin (NEURONTIN) 100 MG capsule Take 100 mg by mouth 3 (three) times daily as needed (for pain).  10/02/18  Yes [provider]  ipratropium-albuterol (DUONEB) 0.5-2.5 (3) MG/3ML SOLN Take 3 mLs by nebulization in the morning, at noon, and at bedtime. 07/17/21  Yes Chesley Mires, MD  OXYGEN Inhale 3 L into the lungs continuous.   Yes [provider]  amLODipine (NORVASC) 5 MG tablet Take 1 tablet (5 mg total) by mouth daily. Patient not taking: Reported on 10/30/2022 11/21/21   Irwin Brakeman L, MD  Azelastine HCl (ASTEPRO) 0.15 % SOLN Place 1 spray into the nose every morning. Patient not taking: Reported on 10/30/2022 08/28/22   Chesley Mires, MD  Budeson-Glycopyrrol-Formoterol (BREZTRI AEROSPHERE) 160-9-4.8 MCG/ACT AERO Inhale 2 puffs into the lungs in the morning and  at bedtime. Patient not taking: Reported on 10/30/2022 08/28/22   Chesley Mires, MD  folic acid (FOLVITE) 1 MG tablet Take 1 tablet (1 mg total) by mouth daily. Patient not taking: Reported on 10/30/2022 11/21/21   Murlean Iba, MD  losartan (COZAAR) 25 MG tablet Take 25 mg by mouth daily.    [provider]  Multiple Vitamin (MULTIVITAMIN WITH MINERALS) TABS tablet Take 1 tablet by mouth daily. Patient not taking: Reported on 10/30/2022 11/21/21    Murlean Iba, MD    Physical Exam: Vitals:   10/30/22 1606 10/30/22 1613 10/30/22 1630 10/30/22 1730  BP: (!) 176/81   (!) 151/68  Pulse: (!) 111  (!) 109 (!) 101  Resp: (!) 22  (!) 22 19  Temp: 98.6 F (37 C)     TempSrc: Oral     SpO2: 92% 95% 95% (!) 89%  Weight:      Height:        Constitutional: NAD, calm, comfortable Vitals:   10/30/22 1606 10/30/22 1613 10/30/22 1630 10/30/22 1730  BP: (!) 176/81   (!) 151/68  Pulse: (!) 111  (!) 109 (!) 101  Resp: (!) 22  (!) 22 19  Temp: 98.6 F (37 C)     TempSrc: Oral     SpO2: 92% 95% 95% (!) 89%  Weight:      Height:       Eyes: PERRL, lids and conjunctivae normal ENMT: Mucous membranes are moist.  Neck: normal, supple, no masses, no thyromegaly Respiratory: Diffuse expiratory wheezing worse on the left lung, mild increased work of breathing, . No accessory muscle use.  Cardiovascular: Tachycardic, regular rate and rhythm, no murmurs / rubs / gallops. No extremity edema.  Extremities warm.  Abdomen: no tenderness, no masses palpated. No hepatosplenomegaly. Bowel sounds positive.  Musculoskeletal: no clubbing / cyanosis. No joint deformity upper and lower extremities. Good ROM, no contractures. Normal muscle tone.  Skin: no rashes, lesions, ulcers. No induration Neurologic: No apparent cranial nerve abnormalities, moving extremities spontaneously.  Psychiatric: Normal judgment and insight. Alert and oriented x 3. Normal mood.   Labs on Admission: I have personally reviewed following labs and imaging studies  CBC: Recent Labs  Lab 10/30/22 1614  WBC 10.8*  NEUTROABS 5.4  HGB 17.5*  HCT 53.4*  MCV 101.3*  PLT 983   Basic Metabolic Panel: Recent Labs  Lab 10/30/22 1614  NA 139  K 4.4  CL 98  CO2 31  GLUCOSE 110*  BUN 9  CREATININE 0.59  CALCIUM 9.2   Coagulation Profile: Recent Labs  Lab 10/30/22 1614  INR 0.9   Radiological Exams on Admission: DG Chest Port 1 View  Result Date:  10/30/2022 CLINICAL DATA:  Shortness of breath. EXAM: PORTABLE CHEST 1 VIEW COMPARISON:  Radiograph 11/13/2021, CT 04/06/2022 FINDINGS: Stable heart size and mediastinal contours. Mild hyperinflation and bronchial thickening. Minimal ill-defined patchy opacity in the periphery of the right lung base. Mild scarring in the periphery of the left lung base. No pulmonary edema, pleural effusion or pneumothorax. On limited assessment, no acute osseous findings. IMPRESSION: 1. Minimal ill-defined patchy opacity in the periphery of the right lung base, may be atelectasis or pneumonia. 2. Mild hyperinflation and bronchial thickening, likely sequela of smoking. Electronically Signed   By: Keith Rake M.D.   On: 10/30/2022 16:33    EKG: Independently reviewed.  Sinus rhythm rate 109, QTc 453.  LAFB.  No significant change from prior.  Assessment/Plan Principal Problem:  Acute on chronic hypoxic respiratory failure (HCC) Active Problems:   COPD with acute exacerbation (HCC)   Tobacco abuse   HTN (hypertension)   Assessment and Plan: * Acute on chronic hypoxic respiratory failure (HCC) O2 sats down to 70% on home 3 L, she was placed on 6 L high flow nasal cannula.  Sats now greater than 92%. Likely 2/2 COPD exacerbation.  Chest X-ray-right lung base ill-defined patchy opacity atelectasis or pneumonia. -Check procalcitonin -Steroids, antibiotics, DuoNebs  COPD with acute exacerbation ([place]) With acute on chronic respiratory failure on 3 L.  Wheezing, productive cough, dyspnea, ongoing tobacco abuse, reports she smokes 1 to 2 cigarettes a week.  Chest x-ray-atelectasis or pneumonia.  Showed pH of 7.3, pCO2 of 74. -IV ceftriaxone and azithromycin -DuoNebs as needed and scheduled -IV Solu-Medrol 125 mg given, continue 80 twice daily - Mucolytic's scheduled -Flutter valve -Check procalcitonin  HTN (hypertension) Systolic 591M to 384Y. -Norvasc and losartan listed on chart, patient does not appear to  be taking these - monitor for now   DVT prophylaxis: Lovenox Code Status: Full Family Communication: None At bedside Disposition Plan: ~ 2 days Consults called: None Admission status: Inpatient, stepdown I certify that at the point of admission it is my clinical judgment that the patient will require inpatient hospital care spanning beyond 2 midnights from the point of admission due to high intensity of service, high risk for further deterioration and high frequency of surveillance required.    Author: Bethena Roys, MD 10/30/2022 8:32 PM  For on call review [email or web address].

## 2022-10-30 NOTE — Assessment & Plan Note (Addendum)
-  As above   -With goal to reduce the patient down to her baseline of 3 L of oxygen by nasal cannula Chest x-ray-atelectasis or pneumonia.   Showed pH of 7.3, pCO2 of 74. -IV ceftriaxone and azithromycin >> switching to p.o. Levaquin -DuoNebs as needed and scheduled -IV Solu-Medrol 125 mg given, tapering down slowly - Mucolytic's scheduled -Flutter valve

## 2022-10-30 NOTE — Sepsis Progress Note (Signed)
Code sepsis protocol being monitored by eLink. 

## 2022-10-30 NOTE — Assessment & Plan Note (Addendum)
-   Worsening respiratory distress, back to 7 L of oxygen, satting 97% (Patient was as low as 5 L yesterday satting 94%... Overnight dipped down to 82%)  Blood pressure (!) 161/80, pulse (!) 108, temp. 98.2 F (36.8 C), resp. rate 20, SpO2 97 % on 7 L of oxygen    -Admission : ABG: 7.3 2/74/58/8 0.0 with bicarb of 38.1  -On admission O2 sats down to 70% on home 3 L,  - Likely 2/2 COPD exacerbation.   - Chest X-ray-right lung base ill-defined patchy opacity atelectasis or pneumonia. -Influenza A, B, SARS-CoV-2 all negative  -On IV steroids Solu-Medrol, increased dose to 80 mg twice daily  -Empiric anabiotic's  -Scheduled DuoNeb bronchodilator treatments -Continue mucolytic's -Encourage use of the spirometer and flutter valve

## 2022-10-31 ENCOUNTER — Other Ambulatory Visit: Payer: Self-pay

## 2022-10-31 ENCOUNTER — Encounter (HOSPITAL_COMMUNITY): Payer: Self-pay | Admitting: Internal Medicine

## 2022-10-31 DIAGNOSIS — J9621 Acute and chronic respiratory failure with hypoxia: Secondary | ICD-10-CM | POA: Diagnosis not present

## 2022-10-31 LAB — CBC
HCT: 51.8 % — ABNORMAL HIGH (ref 36.0–46.0)
Hemoglobin: 16.3 g/dL — ABNORMAL HIGH (ref 12.0–15.0)
MCH: 32.7 pg (ref 26.0–34.0)
MCHC: 31.5 g/dL (ref 30.0–36.0)
MCV: 103.8 fL — ABNORMAL HIGH (ref 80.0–100.0)
Platelets: 191 10*3/uL (ref 150–400)
RBC: 4.99 MIL/uL (ref 3.87–5.11)
RDW: 13 % (ref 11.5–15.5)
WBC: 7.8 10*3/uL (ref 4.0–10.5)
nRBC: 0 % (ref 0.0–0.2)

## 2022-10-31 LAB — BASIC METABOLIC PANEL
Anion gap: 7 (ref 5–15)
BUN: 9 mg/dL (ref 6–20)
CO2: 34 mmol/L — ABNORMAL HIGH (ref 22–32)
Calcium: 8.9 mg/dL (ref 8.9–10.3)
Chloride: 99 mmol/L (ref 98–111)
Creatinine, Ser: 0.55 mg/dL (ref 0.44–1.00)
GFR, Estimated: >60 mL/min (ref >60)
Glucose, Bld: 130 mg/dL — ABNORMAL HIGH (ref 70–99)
Potassium: 4.3 mmol/L (ref 3.5–5.1)
Sodium: 140 mmol/L (ref 135–145)

## 2022-10-31 LAB — GLUCOSE, CAPILLARY
Glucose-Capillary: 131 mg/dL — ABNORMAL HIGH (ref 70–99)
Glucose-Capillary: 106 mg/dL — ABNORMAL HIGH (ref 70–99)

## 2022-10-31 LAB — URINE CULTURE: Culture: NO GROWTH

## 2022-10-31 LAB — MRSA NEXT GEN BY PCR, NASAL: MRSA by PCR Next Gen: NOT DETECTED

## 2022-10-31 MED ORDER — CHLORHEXIDINE GLUCONATE CLOTH 2 % EX PADS
6.0000 | MEDICATED_PAD | Freq: Every day | CUTANEOUS | Status: DC
  Administered 2022-10-31 – 2022-11-01 (×2): 6 via TOPICAL

## 2022-10-31 MED ORDER — IBUPROFEN 400 MG PO TABS
400.0000 mg | ORAL_TABLET | Freq: Once | ORAL | Status: AC
  Administered 2022-10-31: 400 mg via ORAL
  Filled 2022-10-31: qty 1

## 2022-10-31 MED ORDER — FUROSEMIDE 10 MG/ML IJ SOLN
40.0000 mg | Freq: Once | INTRAMUSCULAR | Status: AC
  Administered 2022-10-31: 40 mg via INTRAVENOUS
  Filled 2022-10-31: qty 4

## 2022-10-31 MED ORDER — AMLODIPINE BESYLATE 5 MG PO TABS
5.0000 mg | ORAL_TABLET | Freq: Every day | ORAL | Status: DC
  Administered 2022-10-31 – 2022-11-01 (×2): 5 mg via ORAL
  Filled 2022-10-31 (×2): qty 1

## 2022-10-31 NOTE — Assessment & Plan Note (Signed)
-   Counseled regarding smoking cessation

## 2022-10-31 NOTE — Hospital Course (Signed)
Leaha Cuervo is a 62 y.o. 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  Presented to the ED with complaints of difficulty breathing, and worsening cough and wheezing over the past 2 days, typical of her COPD exacerbation and pneumonia.  She reports cough productive of whitish sputum.  No chest pain.  No lower extremity swelling.  She reports about 2 weeks ago she was diagnosed with an ear infection and completed course of amoxicillin. Over the past 2 days she has also had bodyaches, nasal congestion, sore throat and headaches.   ED Course: Tmax 98.6.  Heart rate 97- 111, rate rate 19-22.  Blood pressure systolic 297-989.  O2 sats 70% on home 3 L.  She was initially placed on 8 L nasal cannula then switched to high flow nasal cannula at 6 L. Patient wheezing diffusely.  WBC 10.8.  BNP- 567.  VBG showed pH of 7.3, pCO2 of 74.  Reviewed and influenza negative.  Chest x-ray showed ill-defined patchy opacity right lung base-atelectasis or pneumonia.  DuoNebs, magnesium, Solu-Medrol 125, ceftriaxone and azithromycin given.

## 2022-10-31 NOTE — Progress Notes (Signed)
PROGRESS NOTE    Patient: Emily Fitzgerald                            PCP: [facility]                    DOB: [birth year]            DOA: 10/30/2022 [id number]             DOS: 10/31/2022, 1:24 PM   LOS: 1 day   Date of Service: The patient was seen and examined on 10/31/2022  Subjective:   The patient was seen and examined this morning. Stable at this time. Still complaining of SOB and wheezing    Brief Narrative:   Emily Fitzgerald is a 61 y.o. 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  Presented to the ED with complaints of difficulty breathing, and worsening cough and wheezing over the past 2 days, typical of her COPD exacerbation and pneumonia.  She reports cough productive of whitish sputum.  No chest pain.  No lower extremity swelling.  She reports about 2 weeks ago she was diagnosed with an ear infection and completed course of amoxicillin. Over the past 2 days she has also had bodyaches, nasal congestion, sore throat and headaches.   ED Course: Tmax 98.6.  Heart rate 97- 111, rate rate 19-22.  Blood pressure systolic 283-662.  O2 sats 70% on home 3 L.  She was initially placed on 8 L nasal cannula then switched to high flow nasal cannula at 6 L. Patient wheezing diffusely.  WBC 10.8.  BNP- 567.  VBG showed pH of 7.3, pCO2 of 74.  Reviewed and influenza negative.  Chest x-ray showed ill-defined patchy opacity right lung base-atelectasis or pneumonia.  DuoNebs, magnesium, Solu-Medrol 125, ceftriaxone and azithromycin given.      Assessment & Plan:   Principal Problem:   Acute on chronic hypoxic respiratory failure (HCC) Active Problems:   COPD with acute exacerbation (HCC)   Tobacco abuse   HTN (hypertension)     Assessment and Plan: * Acute on chronic hypoxic respiratory failure (HCC) - Improving Blood pressure (!) 158/76, Fitzgerald 96, temperature 97.6 F (36.4 C),  resp. rate 16, height  SpO2 96 %  on 7 L of supplemental oxygen high flow  -Admission : ABG: 7.3 2/74/58/8 0.0 with bicarb of 38.1  O2 sats down to 70% on home 3 L,  - Likely 2/2 COPD exacerbation.   - Chest X-ray-right lung base ill-defined patchy opacity atelectasis or pneumonia. -Influenza A, B, SARS-CoV-2 all negative -Steroids, antibiotics, DuoNebs  COPD with acute exacerbation ([place]) -Remain in exacerbation, currently requiring 7 L of oxygen, satting 96%  With acute on chronic respiratory failure on 3 L.  Wheezing, productive cough, dyspnea, ongoing tobacco abuse, reports she smokes 1 to 2 cigarettes a week.   Chest x-ray-atelectasis or pneumonia.  Showed pH of 7.3, pCO2 of 74. -IV ceftriaxone and azithromycin -DuoNebs as needed and scheduled -IV Solu-Medrol 125 mg given, continue 80 twice daily - Mucolytic's scheduled -Flutter valve -Check procalcitonin  HTN (hypertension) This morning BP 947/65 Systolic 465K to 354S. -Norvasc and losartan listed on chart, patient does not appear to be taking these - monitor for now -Will resume home medication  Tobacco abuse - Counseled regarding smoking cessation       ----------------------------------------------------------------------------------------------------------------------------------------------- Nutritional status:  The patient's BMI is: Body  mass index is 33.94 kg/m. I agree with the assessment and plan as outlined  ---------------------------------------------------------------------------------------------------------------------------------------------------- Cultures; Blood Cultures x 2 >> NGT Urine Culture  >>> NGT  Sputum Culture >> NGT   ------------------------------------------------------------------------------------------------------------------------------------------------  DVT prophylaxis:  enoxaparin (LOVENOX) injection 40 mg Start: 10/30/22 2115   Code Status:   Code Status: Full Code  Family Communication: No family  member present at bedside- attempt will be made to update daily The above findings and plan of care has been discussed with patient (and family)  in detail,  they expressed understanding and agreement of above. -Advance care planning has been discussed.   Admission status:   Status is: Inpatient Remains inpatient appropriate because: IVF, IV Abx and high flow oxygen      Procedures:   No admission procedures for hospital encounter.   Antimicrobials:  Anti-infectives (From admission, onward)    Start     Dose/Rate Route Frequency Ordered Stop   10/31/22 1600  cefTRIAXone (ROCEPHIN) 1 g in sodium chloride 0.9 % 100 mL IVPB        1 g 200 mL/hr over 30 Minutes Intravenous Every 24 hours 10/30/22 2028 11/05/22 1559   10/31/22 1600  azithromycin (ZITHROMAX) 500 mg in sodium chloride 0.9 % 250 mL IVPB        500 mg 250 mL/hr over 60 Minutes Intravenous Every 24 hours 10/30/22 2028     10/30/22 1645  cefTRIAXone (ROCEPHIN) 1 g in sodium chloride 0.9 % 100 mL IVPB        1 g 200 mL/hr over 30 Minutes Intravenous  Once 10/30/22 1639 10/30/22 1750   10/30/22 1645  azithromycin (ZITHROMAX) 500 mg in sodium chloride 0.9 % 250 mL IVPB        500 mg 250 mL/hr over 60 Minutes Intravenous  Once 10/30/22 1639 10/30/22 1842        Medication:   Chlorhexidine Gluconate Cloth  6 each Topical Daily   enoxaparin (LOVENOX) injection  40 mg Subcutaneous Q24H   guaiFENesin-dextromethorphan  10 mL Oral Q8H   ipratropium-albuterol  3 mL Nebulization Q6H   methylPREDNISolone (SOLU-MEDROL) injection  80 mg Intravenous Q12H   Followed by   Derrill Memo ON 11/01/2022] predniSONE  40 mg Oral Q breakfast   mometasone-formoterol  2 puff Inhalation BID    acetaminophen **OR** acetaminophen, gabapentin, ipratropium-albuterol, ondansetron **OR** ondansetron (ZOFRAN) IV, polyethylene glycol   Objective:   Vitals:   10/31/22 0900 10/31/22 1000 10/31/22 1100 10/31/22 1152  BP: (!) 158/76     Fitzgerald: 91 96  82 96  Resp: '17 17 15 16  '$ Temp:    97.6 F (36.4 C)  TempSrc:    Oral  SpO2: 95% 96% 96% 96%  Weight:      Height:        Intake/Output Summary (Last 24 hours) at 10/31/2022 1324 Last data filed at 10/31/2022 0755 Gross per 24 hour  Intake --  Output 900 ml  Net -900 ml   Filed Weights   10/30/22 1604 10/30/22 2008  Weight: 95.3 kg 98.3 kg     Examination:   Physical Exam  Constitution:  Alert, cooperative, no distress,  Appears calm and comfortable  Psychiatric:   Normal and stable mood and affect, cognition intact,   HEENT:        Normocephalic, PERRL, otherwise with in Normal limits  Chest:         Chest symmetric Cardio vascular:  S1/S2, RRR, No murmure, No Rubs or Gallops  pulmonary:  respirations unlabored, ++ wheezes / crackles Abdomen: Soft, non-tender, non-distended, bowel sounds,no masses, no organomegaly Muscular skeletal: Limited exam - in bed, able to move all 4 extremities,   Neuro: CNII-XII intact. , normal motor and sensation, reflexes intact  Extremities: No pitting edema lower extremities, +2 pulses  Skin: Dry, warm to touch, negative for any Rashes, No open wounds Wounds: per nursing documentation   ------------------------------------------------------------------------------------------------------------------------------------------    LABs:     Latest Ref Rng & Units 10/31/2022    3:39 AM 10/30/2022    4:14 PM 11/15/2021    5:57 AM  CBC  WBC 4.0 - 10.5 K/uL 7.8  10.8  25.9   Hemoglobin 12.0 - 15.0 g/dL 16.3  17.5  13.3   Hematocrit 36.0 - 46.0 % 51.8  53.4  41.9   Platelets 150 - 400 K/uL 191  223  292       Latest Ref Rng & Units 10/31/2022    3:39 AM 10/30/2022    4:14 PM 11/15/2021    5:57 AM  CMP  Glucose 70 - 99 mg/dL 130  110  142   BUN 6 - 20 mg/dL '9  9  13   '$ Creatinine 0.44 - 1.00 mg/dL 0.55  0.59  0.48   Sodium 135 - 145 mmol/L 140  139  135   Potassium 3.5 - 5.1 mmol/L 4.3  4.4  4.6   Chloride 98 - 111 mmol/L 99  98  97    CO2 22 - 32 mmol/L 34  31  31   Calcium 8.9 - 10.3 mg/dL 8.9  9.2  8.8        Micro Results Recent Results (from the past 240 hour(s))  Blood Culture (routine x 2)     Status: None (Preliminary result)   Collection Time: 10/30/22  5:07 PM   Specimen: Left Antecubital; Blood  Result Value Ref Range Status   Specimen Description LEFT ANTECUBITAL  Final   Special Requests   Final    BOTTLES DRAWN AEROBIC AND ANAEROBIC Blood Culture adequate volume   Culture   Final    NO GROWTH < 24 HOURS Performed at [facility], [address] [place], [place] [address]    Report Status PENDING  Incomplete  Blood Culture (routine x 2)     Status: None (Preliminary result)   Collection Time: 10/30/22  5:13 PM   Specimen: Right Antecubital; Blood  Result Value Ref Range Status   Specimen Description RIGHT ANTECUBITAL  Final   Special Requests   Final    BOTTLES DRAWN AEROBIC AND ANAEROBIC Blood Culture results may not be optimal due to an excessive volume of blood received in culture bottles   Culture   Final    NO GROWTH < 24 HOURS Performed at [facility], [address], [place], [place] [address]    Report Status PENDING  Incomplete  Resp Panel by RT-PCR (Flu A&B, Covid) Anterior Nasal Swab     Status: None   Collection Time: 10/30/22  6:00 PM   Specimen: Anterior Nasal Swab  Result Value Ref Range Status   SARS Coronavirus 2 by RT PCR NEGATIVE NEGATIVE Final    Comment: (NOTE) SARS-CoV-2 target nucleic acids are NOT DETECTED.  The SARS-CoV-2 RNA is generally detectable in upper respiratory specimens during the acute phase of infection. The lowest concentration of SARS-CoV-2 viral copies this assay can detect is 138 copies/mL. A negative result does not preclude SARS-Cov-2 infection and should not be used as the  sole basis for treatment or other patient management decisions. A negative result may occur with  improper specimen collection/handling, submission of specimen other than  nasopharyngeal swab, presence of viral mutation(s) within the areas targeted by this assay, and inadequate number of viral copies(<138 copies/mL). A negative result must be combined with clinical observations, patient history, and epidemiological information. The expected result is Negative.  Fact Sheet for Patients:  EntrepreneurPulse.com.au  Fact Sheet for Healthcare Providers:  IncredibleEmployment.be  This test is no t yet approved or cleared by the [place] FDA and  has been authorized for detection and/or diagnosis of SARS-CoV-2 by FDA under an Emergency Use Authorization (EUA). This EUA will remain  in effect (meaning this test can be used) for the duration of the COVID-19 declaration under Section 564(b)(1) of the Act, 21 U.S.C.section 360bbb-3(b)(1), unless the authorization is terminated  or revoked sooner.       Influenza A by PCR NEGATIVE NEGATIVE Final   Influenza B by PCR NEGATIVE NEGATIVE Final    Comment: (NOTE) The Xpert Xpress SARS-CoV-2/FLU/RSV plus assay is intended as an aid in the diagnosis of influenza from Nasopharyngeal swab specimens and should not be used as a sole basis for treatment. Nasal washings and aspirates are unacceptable for Xpert Xpress SARS-CoV-2/FLU/RSV testing.  Fact Sheet for Patients: EntrepreneurPulse.com.au  Fact Sheet for Healthcare Providers: IncredibleEmployment.be  This test is not yet approved or cleared by the [place] FDA and has been authorized for detection and/or diagnosis of SARS-CoV-2 by FDA under an Emergency Use Authorization (EUA). This EUA will remain in effect (meaning this test can be used) for the duration of the COVID-19 declaration under Section 564(b)(1) of the Act, 21 U.S.C. section 360bbb-3(b)(1), unless the authorization is terminated or revoked.  Performed at [facility], [address] [place], [place] [address]   MRSA  Next Gen by PCR, Nasal     Status: None   Collection Time: 10/31/22 10:51 AM   Specimen: Nasal Mucosa; Nasal Swab  Result Value Ref Range Status   MRSA by PCR Next Gen NOT DETECTED NOT DETECTED Final    Comment: (NOTE) The GeneXpert MRSA Assay (FDA approved for NASAL specimens only), is one component of a comprehensive MRSA colonization surveillance program. It is not intended to diagnose MRSA infection nor to guide or monitor treatment for MRSA infections. Test performance is not FDA approved in patients less than 63 years old. Performed at [facility], [address] [place], [place] [address]     Radiology Reports DG Chest [place] 1 View  Result Date: 10/30/2022 CLINICAL DATA:  Shortness of breath. EXAM: PORTABLE CHEST 1 VIEW COMPARISON:  Radiograph 11/13/2021, CT 04/06/2022 FINDINGS: Stable heart size and mediastinal contours. Mild hyperinflation and bronchial thickening. Minimal ill-defined patchy opacity in the periphery of the right lung base. Mild scarring in the periphery of the left lung base. No pulmonary edema, pleural effusion or pneumothorax. On limited assessment, no acute osseous findings. IMPRESSION: 1. Minimal ill-defined patchy opacity in the periphery of the right lung base, may be atelectasis or pneumonia. 2. Mild hyperinflation and bronchial thickening, likely sequela of smoking. Electronically Signed   By: Keith Rake M.D.   On: 10/30/2022 16:33    SIGNED: Deatra James, MD, FHM. [facility],  Pager (please use [email or web address] to page/text) Please use Epic Secure Chat for non-urgent communication (7AM-7PM)  If 7PM-7AM, please contact night-coverage [email or web address], 10/31/2022, 1:24 PM

## 2022-10-31 NOTE — TOC Transition Note (Signed)
Transition of Care (TOC) - CM/SW Discharge Note    Transition of Care [facility]) Screening Note   Patient Details  Name: Emily Fitzgerald Date of Birth: 1961-02-07   Transition of Care [facility]) CM/SW Contact:    Boneta Lucks, RN Phone Number: 10/31/2022, 2:10 PM    Transition of Care Department [facility]) has reviewed patient and no TOC needs have been identified at this time. We will continue to monitor patient advancement through interdisciplinary progression rounds. If new patient transition needs arise, please place a TOC consult.    Barriers to Discharge: Continued Medical Work up

## 2022-11-01 DIAGNOSIS — J9621 Acute and chronic respiratory failure with hypoxia: Secondary | ICD-10-CM | POA: Diagnosis not present

## 2022-11-01 LAB — GLUCOSE, CAPILLARY
Glucose-Capillary: 156 mg/dL — ABNORMAL HIGH (ref 70–99)
Glucose-Capillary: 133 mg/dL — ABNORMAL HIGH (ref 70–99)

## 2022-11-01 LAB — BRAIN NATRIURETIC PEPTIDE: B Natriuretic Peptide: 182 pg/mL — ABNORMAL HIGH (ref 0.0–100.0)

## 2022-11-01 MED ORDER — KETOROLAC TROMETHAMINE 15 MG/ML IJ SOLN
15.0000 mg | Freq: Once | INTRAMUSCULAR | Status: AC
  Administered 2022-11-01: 15 mg via INTRAVENOUS
  Filled 2022-11-01: qty 1

## 2022-11-01 MED ORDER — AMLODIPINE BESYLATE 5 MG PO TABS
10.0000 mg | ORAL_TABLET | Freq: Every day | ORAL | Status: DC
  Administered 2022-11-02 – 2022-11-04 (×3): 10 mg via ORAL
  Filled 2022-11-01 (×3): qty 2

## 2022-11-01 MED ORDER — FUROSEMIDE 10 MG/ML IJ SOLN
40.0000 mg | Freq: Once | INTRAMUSCULAR | Status: AC
  Administered 2022-11-01: 40 mg via INTRAVENOUS
  Filled 2022-11-01: qty 4

## 2022-11-01 MED ORDER — AZITHROMYCIN 250 MG PO TABS
500.0000 mg | ORAL_TABLET | Freq: Every day | ORAL | Status: DC
  Administered 2022-11-01: 500 mg via ORAL
  Filled 2022-11-01: qty 2

## 2022-11-01 MED ORDER — METHYLPREDNISOLONE SODIUM SUCC 125 MG IJ SOLR
80.0000 mg | Freq: Two times a day (BID) | INTRAMUSCULAR | Status: DC
  Administered 2022-11-01 (×2): 80 mg via INTRAVENOUS
  Filled 2022-11-01 (×2): qty 2

## 2022-11-01 MED ORDER — IPRATROPIUM-ALBUTEROL 0.5-2.5 (3) MG/3ML IN SOLN
3.0000 mL | Freq: Three times a day (TID) | RESPIRATORY_TRACT | Status: DC
  Administered 2022-11-02 – 2022-11-04 (×7): 3 mL via RESPIRATORY_TRACT
  Filled 2022-11-01 (×7): qty 3

## 2022-11-01 NOTE — Evaluation (Signed)
Physical Therapy Evaluation Patient Details Name: Emily Fitzgerald MRN: [id number] DOB: 02-11-1961 Today's Date: 11/01/2022  History of Present Illness  Emily Fitzgerald is a 61 y.o. female who was admitted on 10/30/22 with difficulty breathing and worsening cough/wheezing. 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   Clinical Impression  Patient able to sit up at bedside with increased time and c/o lightheadness with BP at 167/77 mmHg. Patient's SpO2 at 95% on 4 LPM O2. Patient then able to transfer to chair without AD and ambulate in hallway with min guard and use of RW due to continued lightheadedness and slight unsteadiness on feet with no loss of balance and SpO2 decreasing to 87%. Patient tolerated sitting up in chair after therapy with SpO2 increasing to 91% after resting. Patient will benefit from continued skilled physical therapy in hospital and recommended venue below to increase strength, balance, endurance for safe ADLs and gait.     Recommendations for follow up therapy are one component of a multi-disciplinary discharge planning process, led by the attending physician.  Recommendations may be updated based on patient status, additional functional criteria and insurance authorization.  Follow Up Recommendations Home health PT      Assistance Recommended at Discharge Set up Supervision/Assistance  Patient can return home with the following  A little help with walking and/or transfers;Assistance with cooking/housework;Help with stairs or ramp for entrance    Equipment Recommendations Rolling walker (2 wheels)  Recommendations for Other Services       Functional Status Assessment Patient has had a recent decline in their functional status and demonstrates the ability to make significant improvements in function in a reasonable and predictable amount of time.     Precautions / Restrictions Precautions Precautions: Fall Restrictions Weight Bearing Restrictions: No       Mobility  Bed Mobility Overal bed mobility: Modified Independent             General bed mobility comments: Increased time, c/o lightheadness once sitting up at bedside with BP at 167/77    Transfers Overall transfer level: Needs assistance Equipment used: None Transfers: Sit to/from Stand, Bed to chair/wheelchair/BSC Sit to Stand: Supervision   Step pivot transfers: Min guard       General transfer comment: patient able to stand and transfer to chair with min guard and no AD, c/o lightheadedness and slightly unsteady on feet    Ambulation/Gait Ambulation/Gait assistance: Min guard, Supervision Gait Distance (Feet): 100 Feet Assistive device: Rolling walker (2 wheels) Gait Pattern/deviations: Decreased step length - right, Decreased step length - left, Decreased stride length Gait velocity: decreased     General Gait Details: patient able to ambulate in hallway with min guard and use of RW for stability/due to lightheadness with no loss of balance. Pt on 4 LPM O2, SpO2 decreased to 87% during ambulation, increased to 91% with rest following  Stairs            Wheelchair Mobility    Modified Rankin (Stroke Patients Only)       Balance Overall balance assessment: Needs assistance Sitting-balance support: No upper extremity supported, Feet supported Sitting balance-Leahy Scale: Good Sitting balance - Comments: seated EOB   Standing balance support: Bilateral upper extremity supported, During functional activity Standing balance-Leahy Scale: Fair Standing balance comment: fair/good with RW                             Pertinent Vitals/Pain Pain  Assessment Pain Assessment: Faces Faces Pain Scale: Hurts little more Pain Location: headache Pain Descriptors / Indicators: Headache, Grimacing Pain Intervention(s): Limited activity within patient's tolerance, Monitored during session, Repositioned    Home Living Family/patient expects to  be discharged to:: Private residence Living Arrangements: Non-relatives/Friends Available Help at Discharge: Friend(s);Available 24 hours/day Type of Home: House Home Access: Stairs to enter Entrance Stairs-Rails: None Entrance Stairs-Number of Steps: 3 Alternate Level Stairs-Number of Steps: 3 Home Layout: Two level Home Equipment: None      Prior Function Prior Level of Function : Independent/Modified Independent             Mobility Comments: Community ambulator w/o AD, does not drive ADLs Comments: Independent     Hand Dominance        Extremity/Trunk Assessment   Upper Extremity Assessment Upper Extremity Assessment: Overall WFL for tasks assessed    Lower Extremity Assessment Lower Extremity Assessment: Overall WFL for tasks assessed    Cervical / Trunk Assessment Cervical / Trunk Assessment: Normal  Communication      Cognition Arousal/Alertness: Awake/alert Behavior During Therapy: WFL for tasks assessed/performed Overall Cognitive Status: Within Functional Limits for tasks assessed                                          General Comments      Exercises     Assessment/Plan    PT Assessment Patient needs continued PT services  PT Problem List Decreased strength;Decreased activity tolerance;Decreased balance;Decreased mobility       PT Treatment Interventions DME instruction;Balance training;Gait training;Stair training;Functional mobility training;Patient/family education;Therapeutic activities;Therapeutic exercise    PT Goals (Current goals can be found in the Care Plan section)  Acute Rehab PT Goals Patient Stated Goal: return home PT Goal Formulation: With patient Time For Goal Achievement: 11/15/22 Potential to Achieve Goals: Good    Frequency Min 2X/week     Co-evaluation               AM-PAC PT "6 Clicks" Mobility  Outcome Measure Help needed turning from your back to your side while in a flat bed  without using bedrails?: None Help needed moving from lying on your back to sitting on the side of a flat bed without using bedrails?: None Help needed moving to and from a bed to a chair (including a wheelchair)?: None Help needed standing up from a chair using your arms (e.g., wheelchair or bedside chair)?: None Help needed to walk in hospital room?: A Little Help needed climbing 3-5 steps with a railing? : A Lot 6 Click Score: 21    End of Session Equipment Utilized During Treatment: Oxygen Activity Tolerance: Patient tolerated treatment well;Patient limited by fatigue Patient left: in chair;with call bell/phone within reach Nurse Communication: Mobility status PT Visit Diagnosis: Unsteadiness on feet (R26.81);Other abnormalities of gait and mobility (R26.89);Muscle weakness (generalized) (M62.81)    Time: 7353-2992 PT Time Calculation (min) (ACUTE ONLY): 30 min   Charges:   PT Evaluation $PT Eval Moderate Complexity: 1 Mod PT Treatments $Therapeutic Activity: 23-37 mins        Zigmund Gottron, SPT

## 2022-11-01 NOTE — Plan of Care (Signed)
  Problem: Acute Rehab PT Goals(only PT should resolve) Goal: Pt Will Go Supine/Side To Sit Outcome: Progressing Flowsheets (Taken 11/01/2022 1054) Pt will go Supine/Side to Sit: Independently Goal: Patient Will Transfer Sit To/From Stand Outcome: Progressing Flowsheets (Taken 11/01/2022 1054) Patient will transfer sit to/from stand: with modified independence Goal: Pt Will Transfer Bed To Chair/Chair To Bed Outcome: Progressing Flowsheets (Taken 11/01/2022 1054) Pt will Transfer Bed to Chair/Chair to Bed: with supervision Goal: Pt Will Ambulate Outcome: Progressing Flowsheets (Taken 11/01/2022 1054) Pt will Ambulate:  with supervision  > 125 feet  with least restrictive assistive device Note: Wean off [place], SPT

## 2022-11-01 NOTE — TOC Initial Note (Signed)
Transition of Care [facility]) - Initial/Assessment Note    Patient Details  Name: Emily Fitzgerald MRN: [id number] Date of Birth: 1961/07/06  Transition of Care [facility]) CM/SW Contact:    Salome Arnt, [place] Phone Number: 11/01/2022, 11:04 AM  Clinical Narrative:  Pt admitted with acute on chronic hypoxic respiratory failure. Assessment completed with pt. TOC received consult for home health/medication assist/DME. Pt reports she lives with a friend. She is independent with ADLs at baseline. Pt is on 3L home O2. Pt reports she gets her medications for $4 with her Medicaid. PT evaluated pt and recommend home health and rolling walker. Discussed both with pt and she does not feel they are necessary, but will notify TOC if needed. No other needs reported.                 Expected Discharge Plan: Home/Self Care Barriers to Discharge: Continued Medical Work up   Patient Goals and CMS Choice Patient states their goals for this hospitalization and ongoing recovery are:: return home   Choice offered to / list presented to : Patient  Expected Discharge Plan and Services Expected Discharge Plan: Home/Self Care In-house Referral: Clinical Social Work     Living arrangements for the past 2 months: Single Family Home                           HH Arranged: Refused HH          Prior Living Arrangements/Services Living arrangements for the past 2 months: Single Family Home Lives with:: Friends Patient language and need for interpreter reviewed:: Yes Do you feel safe going back to the place where you live?: Yes      Need for Family Participation in Patient Care: No (Comment)   Current home services: DME (O2) Criminal Activity/Legal Involvement Pertinent to Current Situation/Hospitalization: No - Comment as needed  Activities of Daily Living Home Assistive Devices/Equipment: None ADL Screening (condition at time of admission) Patient's cognitive ability adequate to safely complete  daily activities?: Yes Is the patient deaf or have difficulty hearing?: No Does the patient have difficulty seeing, even when wearing glasses/contacts?: No Does the patient have difficulty concentrating, remembering, or making decisions?: No Patient able to express need for assistance with ADLs?: Yes Does the patient have difficulty dressing or bathing?: No Independently performs ADLs?: Yes (appropriate for developmental age) Does the patient have difficulty walking or climbing stairs?: No Weakness of Legs: None Weakness of Arms/Hands: None  Permission Sought/Granted                  Emotional Assessment     Affect (typically observed): Appropriate Orientation: : Oriented to Self, Oriented to Place, Oriented to  Time, Oriented to Situation Alcohol / Substance Use: Not Applicable Psych Involvement: No (comment)  Admission diagnosis:  Community acquired pneumonia, unspecified laterality [J18.9] Acute on chronic hypoxic respiratory failure ([place]) [J96.21] Patient Active Problem List   Diagnosis Date Noted   Acute on chronic hypoxic respiratory failure ([place]) 10/30/2022   HTN (hypertension) 10/30/2022   Leukocytosis 11/14/2021   Elevated MCV 11/14/2021   Tobacco abuse 11/14/2021   Obesity (BMI 30.0-34.9) 11/14/2021   GERD (gastroesophageal reflux disease) 11/14/2021   Transaminasemia    COPD with acute exacerbation ([place]) 01/08/2018   Community acquired pneumonia of left lower lobe of lung 01/04/2018   Acute on chronic respiratory failure with hypoxia ([place]) 01/04/2018   Elevated LFTs 01/04/2018   PCP:  Gwenlyn Saran, [place]  Healthcare Pharmacy:   [facility] [address] [place] [address] [place] [address] Phone: [phone] Fax: [phone]     Social Determinants of Health (SDOH) Interventions Food Insecurity Interventions: Other (Comment), Inpatient TOC (Pt states she has food stamps and in the past was concerned about  access to food. Pt declines resources.)  Readmission Risk Interventions    11/20/2021   10:41 AM  Readmission Risk Prevention Plan  Medication Screening Complete  Transportation Screening Complete

## 2022-11-01 NOTE — Progress Notes (Signed)
PROGRESS NOTE    Patient: Emily Fitzgerald                            PCP: [facility]                    DOB: 03-Nov-1961            DOA: 10/30/2022 HQI:[id number]             DOS: 11/01/2022, 10:16 AM   LOS: 2 days   Date of Service: The patient was seen and examined on 11/01/2022  Subjective:   The patient was seen and examined stable down from 7 L of oxygen to 4 L, satting 90%, still audibly wheezing Shortness of breath with minimal exertion   Brief Narrative:   Emily Fitzgerald is a 61 y.o. 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  Presented to the ED with complaints of difficulty breathing, and worsening cough and wheezing over the past 2 days, typical of her COPD exacerbation and pneumonia.  She reports cough productive of whitish sputum.  No chest pain.  No lower extremity swelling.  She reports about 2 weeks ago she was diagnosed with an ear infection and completed course of amoxicillin. Over the past 2 days she has also had bodyaches, nasal congestion, sore throat and headaches.   ED Course: Tmax 98.6.  Heart rate 97- 111, rate rate 19-22.  Blood pressure systolic 132-440.  O2 sats 70% on home 3 L.  She was initially placed on 8 L nasal cannula then switched to high flow nasal cannula at 6 L. Patient wheezing diffusely.  WBC 10.8.  BNP- 567.  VBG showed pH of 7.3, pCO2 of 74.  Reviewed and influenza negative.  Chest x-ray showed ill-defined patchy opacity right lung base-atelectasis or pneumonia.  DuoNebs, magnesium, Solu-Medrol 125, ceftriaxone and azithromycin given.      Assessment & Plan:   Principal Problem:   Acute on chronic hypoxic respiratory failure (HCC) Active Problems:   COPD with acute exacerbation (HCC)   Tobacco abuse   HTN (hypertension)     Assessment and Plan: * Acute on chronic hypoxic respiratory failure (HCC) - Continue to improve down from 7 L to 4 L of oxygen, satting 90% this  morning  Blood pressure (!) 163/75, Fitzgerald 76, temp. 98.3 F (36.8 C), SpO2 96 % on 4 L of oxygen   -Admission : ABG: 7.3 2/74/58/8 0.0 with bicarb of 38.1  -On admission O2 sats down to 70% on home 3 L,  - Likely 2/2 COPD exacerbation.   - Chest X-ray-right lung base ill-defined patchy opacity atelectasis or pneumonia. -Influenza A, B, SARS-CoV-2 all negative  -Continuing high-dose IV steroids  -Empiric anabiotic's  -Scheduled DuoNeb bronchodilator treatments  COPD with acute exacerbation (HCC) - Improving down from 7 L to 4 L of oxygen via nasal cannula high flow, satting 90% -Will continue scheduled bronchodilators, IV steroids, empiric antibiotics  With acute on chronic respiratory failure on 3 L.   Chest x-ray-atelectasis or pneumonia.   Showed pH of 7.3, pCO2 of 74. -IV ceftriaxone and azithromycin -DuoNebs as needed and scheduled -IV Solu-Medrol 125 mg given, continue 80 twice daily - Mucolytic's scheduled -Flutter valve   HTN (hypertension) This morning BP 102/72 Systolic 536U to 440H. -Norvasc and losartan listed on chart, patient does not appear to be taking these - monitor for now -Will  resume home medication  Tobacco abuse - Counseled regarding smoking cessation       ----------------------------------------------------------------------------------------------------------------------------------------------- Nutritional status:  The patient's BMI is: Body mass index is 33.94 kg/m. I agree with the assessment and plan as outlined  ---------------------------------------------------------------------------------------------------------------------------------------------------- Cultures; Blood Cultures x 2 >> NGT Urine Culture  >>> NGT  Sputum Culture >> NGT   ------------------------------------------------------------------------------------------------------------------------------------------------  DVT prophylaxis:  enoxaparin (LOVENOX)  injection 40 mg Start: 10/30/22 2115   Code Status:   Code Status: Full Code  Family Communication: No family member present at bedside- attempt will be made to update daily The above findings and plan of care has been discussed with patient (and family)  in detail,  they expressed understanding and agreement of above. -Advance care planning has been discussed.   Admission status:   Status is: Inpatient Remains inpatient appropriate because: IVF, IV Abx and high flow oxygen      Procedures:   No admission procedures for hospital encounter.   Antimicrobials:  Anti-infectives (From admission, onward)    Start     Dose/Rate Route Frequency Ordered Stop   10/31/22 1600  cefTRIAXone (ROCEPHIN) 1 g in sodium chloride 0.9 % 100 mL IVPB        1 g 200 mL/hr over 30 Minutes Intravenous Every 24 hours 10/30/22 2028 11/05/22 1559   10/31/22 1600  azithromycin (ZITHROMAX) 500 mg in sodium chloride 0.9 % 250 mL IVPB        500 mg 250 mL/hr over 60 Minutes Intravenous Every 24 hours 10/30/22 2028     10/30/22 1645  cefTRIAXone (ROCEPHIN) 1 g in sodium chloride 0.9 % 100 mL IVPB        1 g 200 mL/hr over 30 Minutes Intravenous  Once 10/30/22 1639 10/30/22 1750   10/30/22 1645  azithromycin (ZITHROMAX) 500 mg in sodium chloride 0.9 % 250 mL IVPB        500 mg 250 mL/hr over 60 Minutes Intravenous  Once 10/30/22 1639 10/30/22 1842        Medication:   amLODipine  5 mg Oral Daily   Chlorhexidine Gluconate Cloth  6 each Topical Daily   enoxaparin (LOVENOX) injection  40 mg Subcutaneous Q24H   guaiFENesin-dextromethorphan  10 mL Oral Q8H   methylPREDNISolone (SOLU-MEDROL) injection  80 mg Intravenous Q12H   mometasone-formoterol  2 puff Inhalation BID    acetaminophen **OR** acetaminophen, gabapentin, ipratropium-albuterol, ondansetron **OR** ondansetron (ZOFRAN) IV, polyethylene glycol   Objective:   Vitals:   11/01/22 0700 11/01/22 0731 11/01/22 0800 11/01/22 0841  BP: (!)  164/75  (!) 163/75   Fitzgerald: 72 81 76   Resp: '16 13 14   '$ Temp:  98.3 F (36.8 C)    TempSrc:  Oral    SpO2: 92% 93% 95% 96%  Weight:      Height:        Intake/Output Summary (Last 24 hours) at 11/01/2022 1016 Last data filed at 11/01/2022 0946 Gross per 24 hour  Intake 3045.98 ml  Output 3900 ml  Net -854.02 ml   Filed Weights   10/30/22 1604 10/30/22 2008 11/01/22 0500  Weight: 95.3 kg 98.3 kg 98.3 kg     Examination:      Physical Exam:   General:  AAO x 3,  cooperative, no distress;   HEENT:  Normocephalic, PERRL, otherwise with in Normal limits   Neuro:  CNII-XII intact. , normal motor and sensation, reflexes intact   Lungs:   Shortness of breath with exertion, scattered wheezes  and rhonchi  Cardio:    S1/S2, RRR, No murmure, No Rubs or Gallops   Abdomen:  Soft, non-tender, bowel sounds active all four quadrants, no guarding or peritoneal signs.  Muscular  skeletal:  Limited exam -global generalized weaknesses - in bed, able to move all 4 extremities,   2+ pulses,  symmetric, No pitting edema  Skin:  Dry, warm to touch, negative for any Rashes,  Wounds: Please see nursing documentation         ------------------------------------------------------------------------------------------------------------------------------------------    LABs:     Latest Ref Rng & Units 10/31/2022    3:39 AM 10/30/2022    4:14 PM 11/15/2021    5:57 AM  CBC  WBC 4.0 - 10.5 K/uL 7.8  10.8  25.9   Hemoglobin 12.0 - 15.0 g/dL 16.3  17.5  13.3   Hematocrit 36.0 - 46.0 % 51.8  53.4  41.9   Platelets 150 - 400 K/uL 191  223  292       Latest Ref Rng & Units 10/31/2022    3:39 AM 10/30/2022    4:14 PM 11/15/2021    5:57 AM  CMP  Glucose 70 - 99 mg/dL 130  110  142   BUN 6 - 20 mg/dL '9  9  13   '$ Creatinine 0.44 - 1.00 mg/dL 0.55  0.59  0.48   Sodium 135 - 145 mmol/L 140  139  135   Potassium 3.5 - 5.1 mmol/L 4.3  4.4  4.6   Chloride 98 - 111 mmol/L 99  98  97   CO2 22 - 32  mmol/L 34  31  31   Calcium 8.9 - 10.3 mg/dL 8.9  9.2  8.8        Micro Results Recent Results (from the past 240 hour(s))  Blood Culture (routine x 2)     Status: None (Preliminary result)   Collection Time: 10/30/22  5:07 PM   Specimen: Left Antecubital; Blood  Result Value Ref Range Status   Specimen Description LEFT ANTECUBITAL  Final   Special Requests   Final    BOTTLES DRAWN AEROBIC AND ANAEROBIC Blood Culture adequate volume   Culture   Final    NO GROWTH 2 DAYS Performed at [facility], [address], [place], [place] [address]    Report Status PENDING  Incomplete  Blood Culture (routine x 2)     Status: None (Preliminary result)   Collection Time: 10/30/22  5:13 PM   Specimen: Right Antecubital; Blood  Result Value Ref Range Status   Specimen Description RIGHT ANTECUBITAL  Final   Special Requests   Final    BOTTLES DRAWN AEROBIC AND ANAEROBIC Blood Culture results may not be optimal due to an excessive volume of blood received in culture bottles   Culture   Final    NO GROWTH 2 DAYS Performed at [facility], [address] [place], [place] [address]    Report Status PENDING  Incomplete  Resp Panel by RT-PCR (Flu A&B, Covid) Anterior Nasal Swab     Status: None   Collection Time: 10/30/22  6:00 PM   Specimen: Anterior Nasal Swab  Result Value Ref Range Status   SARS Coronavirus 2 by RT PCR NEGATIVE NEGATIVE Final    Comment: (NOTE) SARS-CoV-2 target nucleic acids are NOT DETECTED.  The SARS-CoV-2 RNA is generally detectable in upper respiratory specimens during the acute phase of infection. The lowest concentration of SARS-CoV-2 viral copies this assay can detect is 138 copies/mL.  A negative result does not preclude SARS-Cov-2 infection and should not be used as the sole basis for treatment or other patient management decisions. A negative result may occur with  improper specimen collection/handling, submission of specimen other than nasopharyngeal swab,  presence of viral mutation(s) within the areas targeted by this assay, and inadequate number of viral copies(<138 copies/mL). A negative result must be combined with clinical observations, patient history, and epidemiological information. The expected result is Negative.  Fact Sheet for Patients:  EntrepreneurPulse.com.au  Fact Sheet for Healthcare Providers:  IncredibleEmployment.be  This test is no t yet approved or cleared by the [place] FDA and  has been authorized for detection and/or diagnosis of SARS-CoV-2 by FDA under an Emergency Use Authorization (EUA). This EUA will remain  in effect (meaning this test can be used) for the duration of the COVID-19 declaration under Section 564(b)(1) of the Act, 21 U.S.C.section 360bbb-3(b)(1), unless the authorization is terminated  or revoked sooner.       Influenza A by PCR NEGATIVE NEGATIVE Final   Influenza B by PCR NEGATIVE NEGATIVE Final    Comment: (NOTE) The Xpert Xpress SARS-CoV-2/FLU/RSV plus assay is intended as an aid in the diagnosis of influenza from Nasopharyngeal swab specimens and should not be used as a sole basis for treatment. Nasal washings and aspirates are unacceptable for Xpert Xpress SARS-CoV-2/FLU/RSV testing.  Fact Sheet for Patients: EntrepreneurPulse.com.au  Fact Sheet for Healthcare Providers: IncredibleEmployment.be  This test is not yet approved or cleared by the [place] FDA and has been authorized for detection and/or diagnosis of SARS-CoV-2 by FDA under an Emergency Use Authorization (EUA). This EUA will remain in effect (meaning this test can be used) for the duration of the COVID-19 declaration under Section 564(b)(1) of the Act, 21 U.S.C. section 360bbb-3(b)(1), unless the authorization is terminated or revoked.  Performed at [facility], [address] [place], [place] [address]   Urine Culture     Status:  None   Collection Time: 10/30/22  6:45 PM   Specimen: Urine, Clean Catch  Result Value Ref Range Status   Specimen Description   Final    URINE, CLEAN CATCH Performed at [facility], [address], [place], [place] [address]    Special Requests   Final    NONE Performed at [facility], [address], [place], [place] [address]    Culture   Final    NO GROWTH Performed at [facility], [place] [address], [place], [place] [address]    Report Status 10/31/2022 FINAL  Final  MRSA Next Gen by PCR, Nasal     Status: None   Collection Time: 10/31/22 10:51 AM   Specimen: Nasal Mucosa; Nasal Swab  Result Value Ref Range Status   MRSA by PCR Next Gen NOT DETECTED NOT DETECTED Final    Comment: (NOTE) The GeneXpert MRSA Assay (FDA approved for NASAL specimens only), is one component of a comprehensive MRSA colonization surveillance program. It is not intended to diagnose MRSA infection nor to guide or monitor treatment for MRSA infections. Test performance is not FDA approved in patients less than 63 years old. Performed at [facility], [address], [place],  [address]     Radiology Reports No results found.  SIGNED: Deatra James, MD, FHM. [facility],  Pager (please use [email or web address] to page/text) Please use Epic Secure Chat for non-urgent communication (7AM-7PM)  If 7PM-7AM, please contact night-coverage [email or web address], 11/01/2022, 10:16 AM

## 2022-11-02 ENCOUNTER — Inpatient Hospital Stay (HOSPITAL_COMMUNITY): Payer: Medicaid Other

## 2022-11-02 DIAGNOSIS — J9621 Acute and chronic respiratory failure with hypoxia: Secondary | ICD-10-CM | POA: Diagnosis not present

## 2022-11-02 LAB — CBC
HCT: 51.8 % — ABNORMAL HIGH (ref 36.0–46.0)
Hemoglobin: 16.7 g/dL — ABNORMAL HIGH (ref 12.0–15.0)
MCH: 32.8 pg (ref 26.0–34.0)
MCHC: 32.2 g/dL (ref 30.0–36.0)
MCV: 101.8 fL — ABNORMAL HIGH (ref 80.0–100.0)
Platelets: 201 10*3/uL (ref 150–400)
RBC: 5.09 MIL/uL (ref 3.87–5.11)
RDW: 12.4 % (ref 11.5–15.5)
WBC: 9.2 10*3/uL (ref 4.0–10.5)
nRBC: 0 % (ref 0.0–0.2)

## 2022-11-02 LAB — BRAIN NATRIURETIC PEPTIDE: B Natriuretic Peptide: 113 pg/mL — ABNORMAL HIGH (ref 0.0–100.0)

## 2022-11-02 LAB — BASIC METABOLIC PANEL
Anion gap: 7 (ref 5–15)
BUN: 19 mg/dL (ref 6–20)
CO2: 39 mmol/L — ABNORMAL HIGH (ref 22–32)
Calcium: 9.2 mg/dL (ref 8.9–10.3)
Chloride: 91 mmol/L — ABNORMAL LOW (ref 98–111)
Creatinine, Ser: 0.47 mg/dL (ref 0.44–1.00)
GFR, Estimated: >60 mL/min (ref >60)
Glucose, Bld: 183 mg/dL — ABNORMAL HIGH (ref 70–99)
Potassium: 4.6 mmol/L (ref 3.5–5.1)
Sodium: 137 mmol/L (ref 135–145)

## 2022-11-02 LAB — GLUCOSE, CAPILLARY
Glucose-Capillary: 159 mg/dL — ABNORMAL HIGH (ref 70–99)
Glucose-Capillary: 133 mg/dL — ABNORMAL HIGH (ref 70–99)

## 2022-11-02 MED ORDER — METHYLPREDNISOLONE SODIUM SUCC 40 MG IJ SOLR: 40.0000 mg | Freq: Two times a day (BID) | INTRAMUSCULAR | Status: DC

## 2022-11-02 MED ORDER — LEVOFLOXACIN 500 MG PO TABS
750.0000 mg | ORAL_TABLET | Freq: Every day | ORAL | Status: DC
  Administered 2022-11-02 – 2022-11-04 (×3): 750 mg via ORAL
  Filled 2022-11-02 (×3): qty 2

## 2022-11-02 MED ORDER — TRAZODONE HCL 50 MG PO TABS
50.0000 mg | ORAL_TABLET | Freq: Every day | ORAL | Status: DC
  Administered 2022-11-02: 50 mg via ORAL
  Filled 2022-11-02: qty 1

## 2022-11-02 MED ORDER — METHYLPREDNISOLONE SODIUM SUCC 125 MG IJ SOLR
80.0000 mg | Freq: Two times a day (BID) | INTRAMUSCULAR | Status: DC
  Administered 2022-11-02 (×2): 80 mg via INTRAVENOUS
  Filled 2022-11-02 (×2): qty 2

## 2022-11-02 MED ORDER — GUAIFENESIN-DM 100-10 MG/5ML PO SYRP
5.0000 mL | ORAL_SOLUTION | ORAL | Status: DC | PRN
  Administered 2022-11-02 – 2022-11-03 (×2): 5 mL via ORAL
  Filled 2022-11-02 (×2): qty 5

## 2022-11-02 MED ORDER — FUROSEMIDE 10 MG/ML IJ SOLN
40.0000 mg | Freq: Once | INTRAMUSCULAR | Status: AC
  Administered 2022-11-02: 40 mg via INTRAVENOUS
  Filled 2022-11-02: qty 4

## 2022-11-02 MED ORDER — LORATADINE 10 MG PO TABS
10.0000 mg | ORAL_TABLET | Freq: Every day | ORAL | Status: DC
  Administered 2022-11-02 – 2022-11-03 (×2): 10 mg via ORAL
  Filled 2022-11-02 (×2): qty 1

## 2022-11-02 NOTE — Progress Notes (Signed)
Physical Therapy Treatment Patient Details Name: Emily Fitzgerald MRN: [id number] DOB: [birth year] Today's Date: 11/02/2022   History of Present Illness Emily Fitzgerald is a 61 y.o. female who was admitted on 10/30/22 with difficulty breathing and worsening cough/wheezing. 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    PT Comments    Patient agreeable to participate and completes bed mobility without assist to don socks at bedside. She transfers to standing and ambulates with RW for support without loss of balance. Patient able to ambulate increased distance today. Patient will benefit from continued skilled physical therapy in hospital and recommended venue below to increase strength, balance, endurance for safe ADLs and gait.    Recommendations for follow up therapy are one component of a multi-disciplinary discharge planning process, led by the attending physician.  Recommendations may be updated based on patient status, additional functional criteria and insurance authorization.  Follow Up Recommendations  Home health PT     Assistance Recommended at Discharge Set up Supervision/Assistance  Patient can return home with the following A little help with walking and/or transfers;Assistance with cooking/housework;Help with stairs or ramp for entrance   Equipment Recommendations  Rolling walker (2 wheels)    Recommendations for Other Services       Precautions / Restrictions Precautions Precautions: Fall Restrictions Weight Bearing Restrictions: No     Mobility  Bed Mobility Overal bed mobility: Modified Independent                  Transfers Overall transfer level: Needs assistance   Transfers: Sit to/from Stand Sit to Stand: Modified independent (Device/Increase time)   Step pivot transfers: Min guard            Ambulation/Gait Ambulation/Gait assistance: Min guard Gait Distance (Feet): 200 Feet Assistive device: Rolling walker (2  wheels) Gait Pattern/deviations: Decreased step length - right, Decreased step length - left, Decreased stride length       General Gait Details: ambulates with RW without loss of balance, no unsteadiness   Stairs             Wheelchair Mobility    Modified Rankin (Stroke Patients Only)       Balance Overall balance assessment: Needs assistance Sitting-balance support: No upper extremity supported, Feet supported Sitting balance-Leahy Scale: Good Sitting balance - Comments: seated EOB   Standing balance support: Bilateral upper extremity supported, During functional activity Standing balance-Leahy Scale: Good Standing balance comment: good with RW                            Cognition Arousal/Alertness: Awake/alert Behavior During Therapy: WFL for tasks assessed/performed Overall Cognitive Status: Within Functional Limits for tasks assessed                                          Exercises      General Comments        Pertinent Vitals/Pain Pain Assessment Pain Assessment: No/denies pain    Home Living                          Prior Function            PT Goals (current goals can now be found in the care plan section) Acute Rehab PT Goals Patient Stated Goal: return home PT  Goal Formulation: With patient Time For Goal Achievement: 11/15/22 Potential to Achieve Goals: Good Progress towards PT goals: Progressing toward goals    Frequency    Min 2X/week      PT Plan Current plan remains appropriate    Co-evaluation              AM-PAC PT "6 Clicks" Mobility   Outcome Measure  Help needed turning from your back to your side while in a flat bed without using bedrails?: None Help needed moving from lying on your back to sitting on the side of a flat bed without using bedrails?: None Help needed moving to and from a bed to a chair (including a wheelchair)?: None Help needed standing up from a  chair using your arms (e.g., wheelchair or bedside chair)?: None Help needed to walk in hospital room?: A Little Help needed climbing 3-5 steps with a railing? : A Lot 6 Click Score: 21    End of Session Equipment Utilized During Treatment: Oxygen Activity Tolerance: Patient tolerated treatment well;Patient limited by fatigue Patient left: with call bell/phone within reach;in bed Nurse Communication: Mobility status PT Visit Diagnosis: Unsteadiness on feet (R26.81);Other abnormalities of gait and mobility (R26.89);Muscle weakness (generalized) (M62.81)     Time: 8675-4492 PT Time Calculation (min) (ACUTE ONLY): 20 min  Charges:  $Therapeutic Activity: 8-22 mins                    2:49 PM, 11/02/22 Mearl Latin PT, DPT Physical Therapist at [facility]

## 2022-11-02 NOTE — Progress Notes (Signed)
PROGRESS NOTE    Patient: Emily Fitzgerald                            PCP: [facility]                    DOB: [birth year]            DOA: 10/30/2022 GYI:[id number]             DOS: 11/02/2022, 11:36 AM   LOS: 3 days   Date of Service: The patient was seen and examined on 11/02/2022  Subjective:   The patient was seen and examined this morning, continues to have audible wheezing, rhonchi Complaining of shortness of breath mild at rest, worse with minimal exertion   Brief Narrative:   Emily Fitzgerald is a 61 y.o. 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  Presented to the ED with complaints of difficulty breathing, and worsening cough and wheezing over the past 2 days, typical of her COPD exacerbation and pneumonia.  She reports cough productive of whitish sputum.  No chest pain.  No lower extremity swelling.  She reports about 2 weeks ago she was diagnosed with an ear infection and completed course of amoxicillin. Over the past 2 days she has also had bodyaches, nasal congestion, sore throat and headaches.   ED Course: Tmax 98.6.  Heart rate 97- 111, rate rate 19-22.  Blood pressure systolic 350-093.  O2 sats 70% on home 3 L.  She was initially placed on 8 L nasal cannula then switched to high flow nasal cannula at 6 L. Patient wheezing diffusely.  WBC 10.8.  BNP- 567.  VBG showed pH of 7.3, pCO2 of 74.  Reviewed and influenza negative.  Chest x-ray showed ill-defined patchy opacity right lung base-atelectasis or pneumonia.  DuoNebs, magnesium, Solu-Medrol 125, ceftriaxone and azithromycin given.      Assessment & Plan:   Principal Problem:   Acute on chronic hypoxic respiratory failure (HCC) Active Problems:   COPD with acute exacerbation (HCC)   Tobacco abuse   HTN (hypertension)     Assessment and Plan: * Acute on chronic hypoxic respiratory failure (HCC) - Worsening respiratory distress, back to 7 L of  oxygen, satting 97% (Patient was as low as 5 L yesterday satting 94%... Overnight dipped down to 82%)  Blood pressure (!) 161/80, Fitzgerald (!) 108, temp. 98.2 F (36.8 C), resp. rate 20, SpO2 97 % on 7 L of oxygen    -Admission : ABG: 7.3 2/74/58/8 0.0 with bicarb of 38.1  -On admission O2 sats down to 70% on home 3 L,  - Likely 2/2 COPD exacerbation.   - Chest X-ray-right lung base ill-defined patchy opacity atelectasis or pneumonia. -Influenza A, B, SARS-CoV-2 all negative  -On IV steroids Solu-Medrol, increased dose to 80 mg twice daily  -Empiric anabiotic's  -Scheduled DuoNeb bronchodilator treatments -Continue mucolytic's -Encourage use of the spirometer and flutter valve  COPD with acute exacerbation (HCC) - Back up to 7 L of oxygen, satting 97%, audible wheezing and rhonchi -Revamping IV steroids, continue antibiotics, scheduled DuoNeb bronchodilator treatments  With acute on chronic respiratory failure on 3 L.   Chest x-ray-atelectasis or pneumonia.   Showed pH of 7.3, pCO2 of 74. -IV ceftriaxone and azithromycin >> switching to p.o. Levaquin -DuoNebs as needed and scheduled -IV Solu-Medrol 125 mg given, continue 80 twice daily - Mucolytic's scheduled -  Flutter valve   HTN (hypertension) This morning BP 856/31 Systolic 497W to 263Z. -Norvasc and losartan listed on chart, patient does not appear to be taking these - monitor for now -Will resume home medication  Tobacco abuse - Counseled regarding smoking cessation       ----------------------------------------------------------------------------------------------------------------------------------------------- Nutritional status:  The patient's BMI is: Body mass index is 33.94 kg/m. I agree with the assessment and plan as outlined  ---------------------------------------------------------------------------------------------------------------------------------------------------- Cultures; Blood Cultures x 2  >> NGT Sputum Culture >> NGT   ------------------------------------------------------------------------------------------------------------------------------------------------  DVT prophylaxis:  enoxaparin (LOVENOX) injection 40 mg Start: 10/30/22 2115   Code Status:   Code Status: Full Code  Family Communication: No family member present at bedside- attempt will be made to update daily The above findings and plan of care has been discussed with patient (and family)  in detail,  they expressed understanding and agreement of above. -Advance care planning has been discussed.   Admission status:   Status is: Inpatient Remains inpatient appropriate because: IVF, IV Abx and high flow oxygen      Procedures:   No admission procedures for hospital encounter.   Antimicrobials:  Anti-infectives (From admission, onward)    Start     Dose/Rate Route Frequency Ordered Stop   11/02/22 1000  levofloxacin (LEVAQUIN) tablet 750 mg        750 mg Oral Daily 11/02/22 0717 11/07/22 0959   11/01/22 1115  azithromycin (ZITHROMAX) tablet 500 mg  Status:  Discontinued        500 mg Oral Daily 11/01/22 1017 11/02/22 0717   10/31/22 1600  cefTRIAXone (ROCEPHIN) 1 g in sodium chloride 0.9 % 100 mL IVPB  Status:  Discontinued        1 g 200 mL/hr over 30 Minutes Intravenous Every 24 hours 10/30/22 2028 11/02/22 0717   10/31/22 1600  azithromycin (ZITHROMAX) 500 mg in sodium chloride 0.9 % 250 mL IVPB  Status:  Discontinued        500 mg 250 mL/hr over 60 Minutes Intravenous Every 24 hours 10/30/22 2028 11/01/22 1017   10/30/22 1645  cefTRIAXone (ROCEPHIN) 1 g in sodium chloride 0.9 % 100 mL IVPB        1 g 200 mL/hr over 30 Minutes Intravenous  Once 10/30/22 1639 10/30/22 1750   10/30/22 1645  azithromycin (ZITHROMAX) 500 mg in sodium chloride 0.9 % 250 mL IVPB        500 mg 250 mL/hr over 60 Minutes Intravenous  Once 10/30/22 1639 10/30/22 1842        Medication:   amLODipine  10 mg Oral  Daily   enoxaparin (LOVENOX) injection  40 mg Subcutaneous Q24H   ipratropium-albuterol  3 mL Nebulization TID   levofloxacin  750 mg Oral Daily   loratadine  10 mg Oral Daily   methylPREDNISolone (SOLU-MEDROL) injection  80 mg Intravenous Q12H   mometasone-formoterol  2 puff Inhalation BID   traZODone  50 mg Oral QHS    acetaminophen **OR** acetaminophen, gabapentin, ipratropium-albuterol, ondansetron **OR** ondansetron (ZOFRAN) IV, polyethylene glycol   Objective:   Vitals:   11/01/22 2003 11/01/22 2042 11/02/22 0125 11/02/22 0357  BP:  (!) 148/76 (!) 141/77 (!) 161/80  Fitzgerald:  88 71 (!) 108  Resp:  '18 18 20  '$ Temp:  98.3 F (36.8 C) 98.1 F (36.7 C) 98.2 F (36.8 C)  TempSrc:   Oral   SpO2: 95% 93% 94% 97%  Weight:      Height:  Intake/Output Summary (Last 24 hours) at 11/02/2022 1136 Last data filed at 11/02/2022 2505 Gross per 24 hour  Intake 840 ml  Output --  Net 840 ml   Filed Weights   10/30/22 1604 10/30/22 2008 11/01/22 0500  Weight: 95.3 kg 98.3 kg 98.3 kg     Examination:      General:  AAO x 3,  cooperative, shortness of breath at rest and worsening with minimal exertion  HEENT:  Normocephalic, PERRL, otherwise with in Normal limits   Neuro:  CNII-XII intact. , normal motor and sensation, reflexes intact   Lungs:   Respiratory effort mildly labored, diffuse wheezing, rhonchi minimal lower lobe crackles    Cardio:    S1/S2, RRR, No murmure, No Rubs or Gallops   Abdomen:  Soft, non-tender, bowel sounds active all four quadrants, no guarding or peritoneal signs.  Muscular  skeletal:  Limited exam -global generalized weaknesses - in bed, able to move all 4 extremities,   2+ pulses,  symmetric, No pitting edema  Skin:  Dry, warm to touch, negative for any Rashes,  Wounds: Please see nursing documentation           ------------------------------------------------------------------------------------------------------------------------------------------    LABs:     Latest Ref Rng & Units 11/02/2022    5:11 AM 10/31/2022    3:39 AM 10/30/2022    4:14 PM  CBC  WBC 4.0 - 10.5 K/uL 9.2  7.8  10.8   Hemoglobin 12.0 - 15.0 g/dL 16.7  16.3  17.5   Hematocrit 36.0 - 46.0 % 51.8  51.8  53.4   Platelets 150 - 400 K/uL 201  191  223       Latest Ref Rng & Units 11/02/2022    5:11 AM 10/31/2022    3:39 AM 10/30/2022    4:14 PM  CMP  Glucose 70 - 99 mg/dL 183  130  110   BUN 6 - 20 mg/dL '19  9  9   '$ Creatinine 0.44 - 1.00 mg/dL 0.47  0.55  0.59   Sodium 135 - 145 mmol/L 137  140  139   Potassium 3.5 - 5.1 mmol/L 4.6  4.3  4.4   Chloride 98 - 111 mmol/L 91  99  98   CO2 22 - 32 mmol/L 39  34  31   Calcium 8.9 - 10.3 mg/dL 9.2  8.9  9.2        Micro Results Recent Results (from the past 240 hour(s))  Blood Culture (routine x 2)     Status: None (Preliminary result)   Collection Time: 10/30/22  5:07 PM   Specimen: Left Antecubital; Blood  Result Value Ref Range Status   Specimen Description LEFT ANTECUBITAL  Final   Special Requests   Final    BOTTLES DRAWN AEROBIC AND ANAEROBIC Blood Culture adequate volume   Culture   Final    NO GROWTH 2 DAYS Performed at [facility], [address], [place], [place] [address]    Report Status PENDING  Incomplete  Blood Culture (routine x 2)     Status: None (Preliminary result)   Collection Time: 10/30/22  5:13 PM   Specimen: Right Antecubital; Blood  Result Value Ref Range Status   Specimen Description RIGHT ANTECUBITAL  Final   Special Requests   Final    BOTTLES DRAWN AEROBIC AND ANAEROBIC Blood Culture results may not be optimal due to an excessive volume of blood received in culture bottles   Culture   Final  NO GROWTH 2 DAYS Performed at [facility], [address], [place], [place] [address]    Report Status PENDING  Incomplete  Resp Panel  by RT-PCR (Flu A&B, Covid) Anterior Nasal Swab     Status: None   Collection Time: 10/30/22  6:00 PM   Specimen: Anterior Nasal Swab  Result Value Ref Range Status   SARS Coronavirus 2 by RT PCR NEGATIVE NEGATIVE Final    Comment: (NOTE) SARS-CoV-2 target nucleic acids are NOT DETECTED.  The SARS-CoV-2 RNA is generally detectable in upper respiratory specimens during the acute phase of infection. The lowest concentration of SARS-CoV-2 viral copies this assay can detect is 138 copies/mL. A negative result does not preclude SARS-Cov-2 infection and should not be used as the sole basis for treatment or other patient management decisions. A negative result may occur with  improper specimen collection/handling, submission of specimen other than nasopharyngeal swab, presence of viral mutation(s) within the areas targeted by this assay, and inadequate number of viral copies(<138 copies/mL). A negative result must be combined with clinical observations, patient history, and epidemiological information. The expected result is Negative.  Fact Sheet for Patients:  EntrepreneurPulse.com.au  Fact Sheet for Healthcare Providers:  IncredibleEmployment.be  This test is no t yet approved or cleared by the [place] FDA and  has been authorized for detection and/or diagnosis of SARS-CoV-2 by FDA under an Emergency Use Authorization (EUA). This EUA will remain  in effect (meaning this test can be used) for the duration of the COVID-19 declaration under Section 564(b)(1) of the Act, 21 U.S.C.section 360bbb-3(b)(1), unless the authorization is terminated  or revoked sooner.       Influenza A by PCR NEGATIVE NEGATIVE Final   Influenza B by PCR NEGATIVE NEGATIVE Final    Comment: (NOTE) The Xpert Xpress SARS-CoV-2/FLU/RSV plus assay is intended as an aid in the diagnosis of influenza from Nasopharyngeal swab specimens and should not be used as a sole basis  for treatment. Nasal washings and aspirates are unacceptable for Xpert Xpress SARS-CoV-2/FLU/RSV testing.  Fact Sheet for Patients: EntrepreneurPulse.com.au  Fact Sheet for Healthcare Providers: IncredibleEmployment.be  This test is not yet approved or cleared by the [place] FDA and has been authorized for detection and/or diagnosis of SARS-CoV-2 by FDA under an Emergency Use Authorization (EUA). This EUA will remain in effect (meaning this test can be used) for the duration of the COVID-19 declaration under Section 564(b)(1) of the Act, 21 U.S.C. section 360bbb-3(b)(1), unless the authorization is terminated or revoked.  Performed at [facility], [address], [place], [place] [address]   Urine Culture     Status: None   Collection Time: 10/30/22  6:45 PM   Specimen: Urine, Clean Catch  Result Value Ref Range Status   Specimen Description   Final    URINE, CLEAN CATCH Performed at [facility], [address], [place], [place] [address]    Special Requests   Final    NONE Performed at [facility], [address] [place], [place] [address]    Culture   Final    NO GROWTH Performed at [facility], [place] [address] [place],  [address]    Report Status 10/31/2022 FINAL  Final  MRSA Next Gen by PCR, Nasal     Status: None   Collection Time: 10/31/22 10:51 AM   Specimen: Nasal Mucosa; Nasal Swab  Result Value Ref Range Status   MRSA by PCR Next Gen NOT DETECTED NOT DETECTED Final  Comment: (NOTE) The GeneXpert MRSA Assay (FDA approved for NASAL specimens only), is one component of a comprehensive MRSA colonization surveillance program. It is not intended to diagnose MRSA infection nor to guide or monitor treatment for MRSA infections. Test performance is not FDA approved in patients less than 21 years old. Performed at [facility] [address], [place], [place] [address]     Radiology Reports DG CHEST PORT 1  VIEW  Result Date: 11/02/2022 CLINICAL DATA:  Shortness of breath EXAM: PORTABLE CHEST 1 VIEW COMPARISON:  Chest x-ray dated October 30, 2022 FINDINGS: Patient is rotated to the left. Cardiac and mediastinal contours are within normal limits. Increased opacity of the left costophrenic angle. No large pleural effusion or evidence of pneumothorax. IMPRESSION: Increased opacity of the left costophrenic angle, concerning for infection or worsening atelectasis. Electronically Signed   By: Yetta Glassman M.D.   On: 11/02/2022 09:33    SIGNED: Deatra James, MD, FHM. [facility],  Pager (please use [email or web address] to page/text) Please use Epic Secure Chat for non-urgent communication (7AM-7PM)  If 7PM-7AM, please contact night-coverage [email or web address], 11/02/2022, 11:36 AM

## 2022-11-03 DIAGNOSIS — J9621 Acute and chronic respiratory failure with hypoxia: Secondary | ICD-10-CM | POA: Diagnosis not present

## 2022-11-03 LAB — CBC
HCT: 50.8 % — ABNORMAL HIGH (ref 36.0–46.0)
Hemoglobin: 16.4 g/dL — ABNORMAL HIGH (ref 12.0–15.0)
MCH: 32.9 pg (ref 26.0–34.0)
MCHC: 32.3 g/dL (ref 30.0–36.0)
MCV: 101.8 fL — ABNORMAL HIGH (ref 80.0–100.0)
Platelets: 216 10*3/uL (ref 150–400)
RBC: 4.99 MIL/uL (ref 3.87–5.11)
RDW: 12.5 % (ref 11.5–15.5)
WBC: 12.6 10*3/uL — ABNORMAL HIGH (ref 4.0–10.5)
nRBC: 0 % (ref 0.0–0.2)

## 2022-11-03 LAB — BASIC METABOLIC PANEL
Anion gap: 8 (ref 5–15)
BUN: 23 mg/dL — ABNORMAL HIGH (ref 6–20)
CO2: 36 mmol/L — ABNORMAL HIGH (ref 22–32)
Calcium: 9.3 mg/dL (ref 8.9–10.3)
Chloride: 91 mmol/L — ABNORMAL LOW (ref 98–111)
Creatinine, Ser: 0.56 mg/dL (ref 0.44–1.00)
GFR, Estimated: >60 mL/min (ref >60)
Glucose, Bld: 218 mg/dL — ABNORMAL HIGH (ref 70–99)
Potassium: 4.4 mmol/L (ref 3.5–5.1)
Sodium: 135 mmol/L (ref 135–145)

## 2022-11-03 LAB — GLUCOSE, CAPILLARY
Glucose-Capillary: 176 mg/dL — ABNORMAL HIGH (ref 70–99)
Glucose-Capillary: 241 mg/dL — ABNORMAL HIGH (ref 70–99)

## 2022-11-03 LAB — BRAIN NATRIURETIC PEPTIDE: B Natriuretic Peptide: 43 pg/mL (ref 0.0–100.0)

## 2022-11-03 MED ORDER — FUROSEMIDE 10 MG/ML IJ SOLN: 40.0000 mg | Freq: Once | INTRAMUSCULAR | Status: DC

## 2022-11-03 MED ORDER — GABAPENTIN 100 MG PO CAPS
100.0000 mg | ORAL_CAPSULE | Freq: Two times a day (BID) | ORAL | Status: DC
  Administered 2022-11-04: 100 mg via ORAL
  Filled 2022-11-03 (×2): qty 1

## 2022-11-03 MED ORDER — DIPHENHYDRAMINE HCL 25 MG PO CAPS
50.0000 mg | ORAL_CAPSULE | Freq: Every day | ORAL | Status: DC
  Administered 2022-11-03: 50 mg via ORAL
  Filled 2022-11-03: qty 2

## 2022-11-03 MED ORDER — HYDROCOD POLI-CHLORPHE POLI ER 10-8 MG/5ML PO SUER
5.0000 mL | Freq: Two times a day (BID) | ORAL | Status: DC
  Administered 2022-11-03 – 2022-11-04 (×3): 5 mL via ORAL
  Filled 2022-11-03 (×3): qty 5

## 2022-11-03 MED ORDER — METHYLPREDNISOLONE SODIUM SUCC 125 MG IJ SOLR
60.0000 mg | INTRAMUSCULAR | Status: DC
  Administered 2022-11-03: 60 mg via INTRAVENOUS
  Filled 2022-11-03: qty 2

## 2022-11-03 MED ORDER — METHYLPREDNISOLONE SODIUM SUCC 125 MG IJ SOLR: 80.0000 mg | INTRAMUSCULAR | Status: DC

## 2022-11-03 MED ORDER — FUROSEMIDE 10 MG/ML IJ SOLN
20.0000 mg | Freq: Once | INTRAMUSCULAR | Status: AC
  Administered 2022-11-03: 20 mg via INTRAVENOUS
  Filled 2022-11-03: qty 2

## 2022-11-03 MED ORDER — GUAIFENESIN-DM 100-10 MG/5ML PO SYRP
10.0000 mL | ORAL_SOLUTION | Freq: Four times a day (QID) | ORAL | Status: DC
  Administered 2022-11-03 – 2022-11-04 (×4): 10 mL via ORAL
  Filled 2022-11-03 (×4): qty 10

## 2022-11-03 NOTE — Progress Notes (Signed)
PROGRESS NOTE    Patient: Emily Fitzgerald                            PCP: [facility]                    DOB: May 18, 1961            DOA: 10/30/2022 ZOX:[id number]             DOS: 11/03/2022, 11:50 AM   LOS: 4 days   Date of Service: The patient was seen and examined on 11/03/2022  Subjective:   The patient was seen and examined this morning. Hemodynamically stable Reporting of persistent cough overnight Was satting 99% on 3 L Patient was tapered down to 3 L satting 92% at rest, with ambulation dropped to 83%    Brief Narrative:   Moreen Piggott is a 61 y.o. 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  Presented to the ED with complaints of difficulty breathing, and worsening cough and wheezing over the past 2 days, typical of her COPD exacerbation and pneumonia.  She reports cough productive of whitish sputum.  No chest pain.  No lower extremity swelling.  She reports about 2 weeks ago she was diagnosed with an ear infection and completed course of amoxicillin. Over the past 2 days she has also had bodyaches, nasal congestion, sore throat and headaches.   ED Course: Tmax 98.6.  Heart rate 97- 111, rate rate 19-22.  Blood pressure systolic 811-914.  O2 sats 70% on home 3 L.  She was initially placed on 8 L nasal cannula then switched to high flow nasal cannula at 6 L. Patient wheezing diffusely.  WBC 10.8.  BNP- 567.  VBG showed pH of 7.3, pCO2 of 74.  Reviewed and influenza negative.  Chest x-ray showed ill-defined patchy opacity right lung base-atelectasis or pneumonia.  DuoNebs, magnesium, Solu-Medrol 125, ceftriaxone and azithromycin given.      Assessment & Plan:   Principal Problem:   Acute on chronic hypoxic respiratory failure (HCC) Active Problems:   COPD with acute exacerbation (HCC)   Tobacco abuse   HTN (hypertension)     Assessment and Plan: * Acute on chronic hypoxic respiratory failure  (HCC) - Remains to be in respiratory distress, down from 7 L to 5 L of oxygen, was satting 99% this morning Supplemental O2 was reduced to 3 L-satting 92% at rest, with ambulation dropped to 83%     -Admission : ABG: 7.3 2/74/58/8 0.0 with bicarb of 38.1 -On admission O2 sats down to 70% on home 3 L,  - Likely 2/2 COPD exacerbation.   - Chest X-ray-right lung base ill-defined patchy opacity atelectasis or pneumonia. -Influenza A, B, SARS-CoV-2 all negative  -On IV steroids Solu-Medrol, 80 mg twice daily >>> reducing to 60 mg IV daily  -Empiric anabiotic's  -Scheduled DuoNeb bronchodilator treatments -Continue mucolytic's -Encourage use of the spirometer and flutter valve  COPD with acute exacerbation (HCC)  -As above   -With goal to reduce the patient down to her baseline of 3 L of oxygen by nasal cannula Chest x-ray-atelectasis or pneumonia.   Showed pH of 7.3, pCO2 of 74. -IV ceftriaxone and azithromycin >> switching to p.o. Levaquin -DuoNebs as needed and scheduled -IV Solu-Medrol 125 mg given, tapering down slowly - Mucolytic's scheduled -Flutter valve   HTN (hypertension) - Elevated BP-improving Systolic 782N to  170s. -Norvasc and losartan listed on chart, patient does not appear to be taking these -Restarted Norvasc at 10 mg daily  Tobacco abuse - Counseled regarding smoking cessation       ----------------------------------------------------------------------------------------------------------------------------------------------- Nutritional status:  The patient's BMI is: Body mass index is 33.94 kg/m. I agree with the assessment and plan as outlined  ---------------------------------------------------------------------------------------------------------------------------------------------------- Cultures; Blood Cultures x 2 >> NGT Sputum Culture >> NGT    ------------------------------------------------------------------------------------------------------------------------------------------------  DVT prophylaxis:  enoxaparin (LOVENOX) injection 40 mg Start: 10/30/22 2115   Code Status:   Code Status: Full Code  Family Communication: No family member present at bedside- attempt will be made to update daily The above findings and plan of care has been discussed with patient (and family)  in detail,  they expressed understanding and agreement of above. -Advance care planning has been discussed.   Admission status:   Status is: Inpatient Remains inpatient appropriate because: IVF, IV Abx and high flow oxygen      Procedures:   No admission procedures for hospital encounter.   Antimicrobials:  Anti-infectives (From admission, onward)    Start     Dose/Rate Route Frequency Ordered Stop   11/02/22 1000  levofloxacin (LEVAQUIN) tablet 750 mg        750 mg Oral Daily 11/02/22 0717 11/07/22 0959   11/01/22 1115  azithromycin (ZITHROMAX) tablet 500 mg  Status:  Discontinued        500 mg Oral Daily 11/01/22 1017 11/02/22 0717   10/31/22 1600  cefTRIAXone (ROCEPHIN) 1 g in sodium chloride 0.9 % 100 mL IVPB  Status:  Discontinued        1 g 200 mL/hr over 30 Minutes Intravenous Every 24 hours 10/30/22 2028 11/02/22 0717   10/31/22 1600  azithromycin (ZITHROMAX) 500 mg in sodium chloride 0.9 % 250 mL IVPB  Status:  Discontinued        500 mg 250 mL/hr over 60 Minutes Intravenous Every 24 hours 10/30/22 2028 11/01/22 1017   10/30/22 1645  cefTRIAXone (ROCEPHIN) 1 g in sodium chloride 0.9 % 100 mL IVPB        1 g 200 mL/hr over 30 Minutes Intravenous  Once 10/30/22 1639 10/30/22 1750   10/30/22 1645  azithromycin (ZITHROMAX) 500 mg in sodium chloride 0.9 % 250 mL IVPB        500 mg 250 mL/hr over 60 Minutes Intravenous  Once 10/30/22 1639 10/30/22 1842        Medication:   amLODipine  10 mg Oral Daily   enoxaparin (LOVENOX)  injection  40 mg Subcutaneous Q24H   ipratropium-albuterol  3 mL Nebulization TID   levofloxacin  750 mg Oral Daily   loratadine  10 mg Oral Daily   methylPREDNISolone (SOLU-MEDROL) injection  60 mg Intravenous Q24H   mometasone-formoterol  2 puff Inhalation BID   traZODone  50 mg Oral QHS    acetaminophen **OR** acetaminophen, gabapentin, guaiFENesin-dextromethorphan, ipratropium-albuterol, ondansetron **OR** ondansetron (ZOFRAN) IV, polyethylene glycol   Objective:   Vitals:   11/03/22 0552 11/03/22 0746 11/03/22 0800 11/03/22 0830  BP: 132/79     Fitzgerald: 85     Resp: 18     Temp: 98.2 F (36.8 C)     TempSrc: Oral     SpO2: 100% 99% 99% 92%  Weight:      Height:        Intake/Output Summary (Last 24 hours) at 11/03/2022 1150 Last data filed at 11/03/2022 0900 Gross per 24 hour  Intake 1200 ml  Output --  Net 1200 ml   Filed Weights   10/30/22 1604 10/30/22 2008 11/01/22 0500  Weight: 95.3 kg 98.3 kg 98.3 kg     Examination:      General:  AAO x 3,  cooperative, shortness of breath at rest and worsening with minimal exertion  HEENT:  Normocephalic, PERRL, otherwise with in Normal limits   Neuro:  CNII-XII intact. , normal motor and sensation, reflexes intact   Lungs:   Respiratory effort mildly labored, diffuse wheezing, rhonchi minimal lower lobe crackles    Cardio:    S1/S2, RRR, No murmure, No Rubs or Gallops   Abdomen:  Soft, non-tender, bowel sounds active all four quadrants, no guarding or peritoneal signs.  Muscular  skeletal:  Limited exam -global generalized weaknesses - in bed, able to move all 4 extremities,   2+ pulses,  symmetric, No pitting edema  Skin:  Dry, warm to touch, negative for any Rashes,  Wounds: Please see nursing documentation          ------------------------------------------------------------------------------------------------------------------------------------------    LABs:     Latest Ref Rng & Units 11/03/2022     4:14 AM 11/02/2022    5:11 AM 10/31/2022    3:39 AM  CBC  WBC 4.0 - 10.5 K/uL 12.6  9.2  7.8   Hemoglobin 12.0 - 15.0 g/dL 16.4  16.7  16.3   Hematocrit 36.0 - 46.0 % 50.8  51.8  51.8   Platelets 150 - 400 K/uL 216  201  191       Latest Ref Rng & Units 11/03/2022    4:14 AM 11/02/2022    5:11 AM 10/31/2022    3:39 AM  CMP  Glucose 70 - 99 mg/dL 218  183  130   BUN 6 - 20 mg/dL '23  19  9   '$ Creatinine 0.44 - 1.00 mg/dL 0.56  0.47  0.55   Sodium 135 - 145 mmol/L 135  137  140   Potassium 3.5 - 5.1 mmol/L 4.4  4.6  4.3   Chloride 98 - 111 mmol/L 91  91  99   CO2 22 - 32 mmol/L 36  39  34   Calcium 8.9 - 10.3 mg/dL 9.3  9.2  8.9        Micro Results Recent Results (from the past 240 hour(s))  Blood Culture (routine x 2)     Status: None (Preliminary result)   Collection Time: 10/30/22  5:07 PM   Specimen: Left Antecubital; Blood  Result Value Ref Range Status   Specimen Description LEFT ANTECUBITAL  Final   Special Requests   Final    BOTTLES DRAWN AEROBIC AND ANAEROBIC Blood Culture adequate volume   Culture   Final    NO GROWTH 4 DAYS Performed at [facility], [address] [place], [place] [address]    Report Status PENDING  Incomplete  Blood Culture (routine x 2)     Status: None (Preliminary result)   Collection Time: 10/30/22  5:13 PM   Specimen: Right Antecubital; Blood  Result Value Ref Range Status   Specimen Description RIGHT ANTECUBITAL  Final   Special Requests   Final    BOTTLES DRAWN AEROBIC AND ANAEROBIC Blood Culture results may not be optimal due to an excessive volume of blood received in culture bottles   Culture   Final    NO GROWTH 4 DAYS Performed at [facility], [address], [place], [place] [address]    Report Status PENDING  Incomplete  Resp Panel by RT-PCR (Flu A&B, Covid) Anterior Nasal Swab     Status: None   Collection Time: 10/30/22  6:00 PM   Specimen: Anterior Nasal Swab  Result Value Ref Range Status   SARS Coronavirus 2 by RT PCR  NEGATIVE NEGATIVE Final    Comment: (NOTE) SARS-CoV-2 target nucleic acids are NOT DETECTED.  The SARS-CoV-2 RNA is generally detectable in upper respiratory specimens during the acute phase of infection. The lowest concentration of SARS-CoV-2 viral copies this assay can detect is 138 copies/mL. A negative result does not preclude SARS-Cov-2 infection and should not be used as the sole basis for treatment or other patient management decisions. A negative result may occur with  improper specimen collection/handling, submission of specimen other than nasopharyngeal swab, presence of viral mutation(s) within the areas targeted by this assay, and inadequate number of viral copies(<138 copies/mL). A negative result must be combined with clinical observations, patient history, and epidemiological information. The expected result is Negative.  Fact Sheet for Patients:  EntrepreneurPulse.com.au  Fact Sheet for Healthcare Providers:  IncredibleEmployment.be  This test is no t yet approved or cleared by the [place] FDA and  has been authorized for detection and/or diagnosis of SARS-CoV-2 by FDA under an Emergency Use Authorization (EUA). This EUA will remain  in effect (meaning this test can be used) for the duration of the COVID-19 declaration under Section 564(b)(1) of the Act, 21 U.S.C.section 360bbb-3(b)(1), unless the authorization is terminated  or revoked sooner.       Influenza A by PCR NEGATIVE NEGATIVE Final   Influenza B by PCR NEGATIVE NEGATIVE Final    Comment: (NOTE) The Xpert Xpress SARS-CoV-2/FLU/RSV plus assay is intended as an aid in the diagnosis of influenza from Nasopharyngeal swab specimens and should not be used as a sole basis for treatment. Nasal washings and aspirates are unacceptable for Xpert Xpress SARS-CoV-2/FLU/RSV testing.  Fact Sheet for Patients: EntrepreneurPulse.com.au  Fact Sheet for  Healthcare Providers: IncredibleEmployment.be  This test is not yet approved or cleared by the [place] FDA and has been authorized for detection and/or diagnosis of SARS-CoV-2 by FDA under an Emergency Use Authorization (EUA). This EUA will remain in effect (meaning this test can be used) for the duration of the COVID-19 declaration under Section 564(b)(1) of the Act, 21 U.S.C. section 360bbb-3(b)(1), unless the authorization is terminated or revoked.  Performed at [facility], [address], [place], [place] [address]   Urine Culture     Status: None   Collection Time: 10/30/22  6:45 PM   Specimen: Urine, Clean Catch  Result Value Ref Range Status   Specimen Description   Final    URINE, CLEAN CATCH Performed at [facility], [address], [place], [place] [address]    Special Requests   Final    NONE Performed at [facility], [address], [place], [place] [address]    Culture   Final    NO GROWTH Performed at [facility], [place] [address], [place], [place] [address]    Report Status 10/31/2022 FINAL  Final  MRSA Next Gen by PCR, Nasal     Status: None   Collection Time: 10/31/22 10:51 AM   Specimen: Nasal Mucosa; Nasal Swab  Result Value Ref Range Status   MRSA by PCR Next Gen NOT DETECTED NOT DETECTED Final    Comment: (NOTE) The GeneXpert MRSA Assay (FDA approved for NASAL specimens only), is one component of a comprehensive MRSA colonization  surveillance program. It is not intended to diagnose MRSA infection nor to guide or monitor treatment for MRSA infections. Test performance is not FDA approved in patients less than 60 years old. Performed at [facility], [address] [place], [place] [address]     Radiology Reports No results found.  SIGNED: Deatra James, MD, FHM. [facility],  Pager (please use [email or web address] to page/text) Please use Epic Secure Chat for non-urgent communication (7AM-7PM)  If 7PM-7AM, please  contact night-coverage [email or web address], 11/03/2022, 11:50 AM

## 2022-11-04 DIAGNOSIS — J9621 Acute and chronic respiratory failure with hypoxia: Secondary | ICD-10-CM | POA: Diagnosis not present

## 2022-11-04 LAB — CBC
HCT: 52.9 % — ABNORMAL HIGH (ref 36.0–46.0)
Hemoglobin: 16.7 g/dL — ABNORMAL HIGH (ref 12.0–15.0)
MCH: 32.1 pg (ref 26.0–34.0)
MCHC: 31.6 g/dL (ref 30.0–36.0)
MCV: 101.5 fL — ABNORMAL HIGH (ref 80.0–100.0)
Platelets: 219 10*3/uL (ref 150–400)
RBC: 5.21 MIL/uL — ABNORMAL HIGH (ref 3.87–5.11)
RDW: 12.7 % (ref 11.5–15.5)
WBC: 18.8 10*3/uL — ABNORMAL HIGH (ref 4.0–10.5)
nRBC: 0 % (ref 0.0–0.2)

## 2022-11-04 LAB — BASIC METABOLIC PANEL
Anion gap: 6 (ref 5–15)
BUN: 28 mg/dL — ABNORMAL HIGH (ref 6–20)
CO2: 38 mmol/L — ABNORMAL HIGH (ref 22–32)
Calcium: 9.3 mg/dL (ref 8.9–10.3)
Chloride: 91 mmol/L — ABNORMAL LOW (ref 98–111)
Creatinine, Ser: 0.53 mg/dL (ref 0.44–1.00)
GFR, Estimated: >60 mL/min (ref >60)
Glucose, Bld: 216 mg/dL — ABNORMAL HIGH (ref 70–99)
Potassium: 5 mmol/L (ref 3.5–5.1)
Sodium: 135 mmol/L (ref 135–145)

## 2022-11-04 LAB — CULTURE, BLOOD (ROUTINE X 2)
Culture: NO GROWTH
Culture: NO GROWTH
Special Requests: ADEQUATE

## 2022-11-04 MED ORDER — AZELASTINE HCL 0.15 % NA SOLN: 1.0000 | Freq: Every morning | NASAL | 1 refills | Status: DC

## 2022-11-04 MED ORDER — PREDNISONE 20 MG PO TABS
60.0000 mg | ORAL_TABLET | Freq: Every day | ORAL | Status: DC
  Administered 2022-11-04: 60 mg via ORAL
  Filled 2022-11-04: qty 3

## 2022-11-04 MED ORDER — LEVOFLOXACIN 750 MG PO TABS: 750.0000 mg | ORAL_TABLET | Freq: Every day | ORAL | 0 refills | Status: AC

## 2022-11-04 MED ORDER — GUAIFENESIN-DM 100-10 MG/5ML PO SYRP: 10.0000 mL | ORAL_SOLUTION | Freq: Four times a day (QID) | ORAL | 0 refills | Status: DC

## 2022-11-04 MED ORDER — IPRATROPIUM-ALBUTEROL 0.5-2.5 (3) MG/3ML IN SOLN: 3.0000 mL | Freq: Three times a day (TID) | RESPIRATORY_TRACT | 5 refills | Status: AC

## 2022-11-04 MED ORDER — METHYLPREDNISOLONE 4 MG PO TBPK: ORAL_TABLET | ORAL | 0 refills | Status: DC

## 2022-11-04 MED ORDER — AMLODIPINE BESYLATE 10 MG PO TABS: 10.0000 mg | ORAL_TABLET | Freq: Every day | ORAL | 1 refills | Status: DC

## 2022-11-04 MED ORDER — BUDESONIDE-FORMOTEROL FUMARATE 80-4.5 MCG/ACT IN AERO: 2.0000 | INHALATION_SPRAY | Freq: Two times a day (BID) | RESPIRATORY_TRACT | 12 refills | Status: DC

## 2022-11-04 NOTE — Progress Notes (Signed)
Nsg Discharge Note  Admit Date:  10/30/2022 Discharge date: 11/04/2022   Juliann Pulse to be D/C'd Home per MD order.  AVS completed.   Patient/caregiver able to verbalize understanding.  Discharge Medication: Allergies as of 11/04/2022   No Known Allergies      Medication List     STOP taking these medications    Breztri Aerosphere 160-9-4.8 MCG/ACT Aero Generic drug: Budeson-Glycopyrrol-Formoterol   folic acid 1 MG tablet Commonly known as: FOLVITE   losartan 25 MG tablet Commonly known as: COZAAR   multivitamin with minerals Tabs tablet       TAKE these medications    albuterol 108 (90 Base) MCG/ACT inhaler Commonly known as: VENTOLIN HFA Inhale 1-2 puffs into the lungs every 6 (six) hours as needed for wheezing or shortness of breath. What changed: how much to take   amLODipine 10 MG tablet Commonly known as: NORVASC Take 1 tablet (10 mg total) by mouth daily. Start taking on: November 05, 2022 What changed:  medication strength how much to take   Azelastine HCl 0.15 % Soln Commonly known as: Astepro Place 1 spray into the nose every morning.   budesonide-formoterol 80-4.5 MCG/ACT inhaler Commonly known as: SYMBICORT Inhale 2 puffs into the lungs 2 (two) times daily. What changed: when to take this   gabapentin 100 MG capsule Commonly known as: NEURONTIN Take 100 mg by mouth 3 (three) times daily as needed (for pain).   guaiFENesin-dextromethorphan 100-10 MG/5ML syrup Commonly known as: ROBITUSSIN DM Take 10 mLs by mouth every 6 (six) hours.   ipratropium-albuterol 0.5-2.5 (3) MG/3ML Soln Commonly known as: DUONEB Take 3 mLs by nebulization in the morning, at noon, and at bedtime.   levofloxacin 750 MG tablet Commonly known as: LEVAQUIN Take 1 tablet (750 mg total) by mouth daily for 3 days. Start taking on: November 05, 2022   methylPREDNISolone 4 MG Tbpk tablet Commonly known as: MEDROL DOSEPAK Medrol Dosepak take as instructed    OXYGEN Inhale 3 L into the lungs continuous.               Durable Medical Equipment  (From admission, onward)           Start     Ordered   11/01/22 1217  For home use only DME Walker rolling  Once       Comments: Patient unsteady on feet having to lean on nearby objects for support, required use of RW for safety with good carryover demonstrated  Question Answer Comment  Walker: With 5 Inch Wheels   Patient needs a walker to treat with the following condition Gait difficulty      11/01/22 1218            Discharge Assessment: Vitals:   11/04/22 0501 11/04/22 0900  BP: 139/64   Pulse: 81   Resp: 20   Temp: 97.7 F (36.5 C)   SpO2: (!) 88% (!) 89%   Skin clean, dry and intact without evidence of skin break down, no evidence of skin tears noted. IV catheter discontinued intact. Site without signs and symptoms of complications - no redness or edema noted at insertion site, patient denies c/o pain - only slight tenderness at site.  Dressing with slight pressure applied.  D/c Instructions-Education: Discharge instructions given to patient/family with verbalized understanding. D/c education completed with patient/family including follow up instructions, medication list, d/c activities limitations if indicated, with other d/c instructions as indicated by MD - patient able to verbalize understanding, all  questions fully answered. Patient instructed to return to ED, call 911, or call MD for any changes in condition.  Patient escorted via [place], and D/C home via private auto.  Kathie Rhodes, RN 11/04/2022 10:09 AM

## 2022-11-04 NOTE — Discharge Summary (Signed)
Physician Discharge Summary   Patient: Emily Fitzgerald MRN: [id number] DOB: 02-Nov-1961  Admit date:     10/30/2022  Discharge date: 11/04/22  Discharge Physician: Deatra James   PCP: [facility]   Recommendations at discharge:   Follow-up with PCP 1-4 weeks Follow-up with pulmonologist in 1-4 weeks Continue current DuoNeb inhalers every 4-6 hours Continue Robitussin, complete antibiotic course, and taper down steroid Medrol Dosepak Avoid smoking  Discharge Diagnoses: Principal Problem:   Acute on chronic hypoxic respiratory failure ([place]) Active Problems:   COPD with acute exacerbation ([place])   Tobacco abuse   HTN (hypertension)  Resolved Problems:   * No resolved hospital problems. *  Hospital Course: Emily Fitzgerald is a 61 y.o. 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  Presented to the ED with complaints of difficulty breathing, and worsening cough and wheezing over the past 2 days, typical of her COPD exacerbation and pneumonia.  She reports cough productive of whitish sputum.  No chest pain.  No lower extremity swelling.  She reports about 2 weeks ago she was diagnosed with an ear infection and completed course of amoxicillin. Over the past 2 days she has also had bodyaches, nasal congestion, sore throat and headaches.   ED Course: Tmax 98.6.  Heart rate 97- 111, rate rate 19-22.  Blood pressure systolic 588-502.  O2 sats 70% on home 3 L.  She was initially placed on 8 L nasal cannula then switched to high flow nasal cannula at 6 L. Patient wheezing diffusely.  WBC 10.8.  BNP- 567.  VBG showed pH of 7.3, pCO2 of 74.  Reviewed and influenza negative.  Chest x-ray showed ill-defined patchy opacity right lung base-atelectasis or pneumonia.  DuoNebs, magnesium, Solu-Medrol 125, ceftriaxone and azithromycin given.    Assessment and Plan: * Acute on chronic hypoxic respiratory failure (HCC) -Improved  respiratory distress, down from 7 L to 5 L currently on 3 L -At rest patient is satting 89-92% on 2.5 L of oxygen     -Admission : ABG: 7.3 2/74/58/8 0.0 with bicarb of 38.1 -On admission O2 sats down to 70% on home 3 L,  - Likely 2/2 COPD exacerbation.   - Chest X-ray-right lung base ill-defined patchy opacity atelectasis or pneumonia. -Influenza A, B, SARS-CoV-2 all negative  -On IV steroids Solu-Medrol, 80 mg twice daily >>> reducing to 60 mg IV daily Prescription for Medrol Dosepak taper has been given -Antibiotics switched to p.o. Levaquin for 3 more days -Continue DuoNeb bronchodilator treatment at home  -Continue mucolytic's -Robitussin -Encourage use of the spirometer and flutter valve  COPD with acute exacerbation ([place])  -As above   -With goal to reduce the patient down to her baseline of 3 L of oxygen by nasal cannula Chest x-ray-atelectasis or pneumonia.   Showed pH of 7.3, pCO2 of 74. -IV ceftriaxone and azithromycin >> switching to p.o. Levaquin -DuoNebs as needed and scheduled -IV Solu-Medrol 125 mg given, tapering down slowly - Mucolytic's scheduled -Flutter valve   HTN (hypertension) - Elevated BP-improving Systolic 774J to 287O. -Norvasc and losartan listed on chart, patient does not appear to be taking these -Restarted Norvasc at 10 mg daily  Tobacco abuse - Counseled regarding smoking cessation       Disposition: Home Diet recommendation:  Discharge Diet Orders (From admission, onward)     Start     Ordered   11/04/22 0000  Diet - low sodium heart healthy  11/04/22 0926           Cardiac diet DISCHARGE MEDICATION: Allergies as of 11/04/2022   No Known Allergies      Medication List     STOP taking these medications    Breztri Aerosphere 160-9-4.8 MCG/ACT Aero Generic drug: Budeson-Glycopyrrol-Formoterol   folic acid 1 MG tablet Commonly known as: FOLVITE   losartan 25 MG tablet Commonly known as: COZAAR    multivitamin with minerals Tabs tablet       TAKE these medications    albuterol 108 (90 Base) MCG/ACT inhaler Commonly known as: VENTOLIN HFA Inhale 1-2 puffs into the lungs every 6 (six) hours as needed for wheezing or shortness of breath. What changed: how much to take   amLODipine 10 MG tablet Commonly known as: NORVASC Take 1 tablet (10 mg total) by mouth daily. Start taking on: November 05, 2022 What changed:  medication strength how much to take   Azelastine HCl 0.15 % Soln Commonly known as: Astepro Place 1 spray into the nose every morning.   budesonide-formoterol 80-4.5 MCG/ACT inhaler Commonly known as: SYMBICORT Inhale 2 puffs into the lungs 2 (two) times daily. What changed: when to take this   gabapentin 100 MG capsule Commonly known as: NEURONTIN Take 100 mg by mouth 3 (three) times daily as needed (for pain).   guaiFENesin-dextromethorphan 100-10 MG/5ML syrup Commonly known as: ROBITUSSIN DM Take 10 mLs by mouth every 6 (six) hours.   ipratropium-albuterol 0.5-2.5 (3) MG/3ML Soln Commonly known as: DUONEB Take 3 mLs by nebulization in the morning, at noon, and at bedtime.   levofloxacin 750 MG tablet Commonly known as: LEVAQUIN Take 1 tablet (750 mg total) by mouth daily for 3 days. Start taking on: November 05, 2022   methylPREDNISolone 4 MG Tbpk tablet Commonly known as: MEDROL DOSEPAK Medrol Dosepak take as instructed   OXYGEN Inhale 3 L into the lungs continuous.               Durable Medical Equipment  (From admission, onward)           Start     Ordered   11/01/22 1217  For home use only DME Walker rolling  Once       Comments: Patient unsteady on feet having to lean on nearby objects for support, required use of RW for safety with good carryover demonstrated  Question Answer Comment  Walker: With [place]   Patient needs a walker to treat with the following condition Gait difficulty      11/01/22 1218             Discharge Exam: Filed Weights   10/30/22 1604 10/30/22 2008 11/01/22 0500  Weight: 95.3 kg 98.3 kg 98.3 kg      Physical Exam:   General:  AAO x 3,  cooperative, no distress;   HEENT:  Normocephalic, PERRL, otherwise with in Normal limits   Neuro:  CNII-XII intact. , normal motor and sensation, reflexes intact   Lungs:   Clear to auscultation BL, Respirations unlabored,  Improved with diffuse wheezes / NO crackles  Cardio:    S1/S2, RRR, No murmure, No Rubs or Gallops   Abdomen:  Soft, non-tender, bowel sounds active all four quadrants, no guarding or peritoneal signs.  Muscular  skeletal:  Limited exam -global generalized weaknesses - in bed, able to move all 4 extremities,   2+ pulses,  symmetric, No pitting edema  Skin:  Dry, warm to touch, negative for any  Rashes,  Wounds: Please see nursing documentation          Condition at discharge: good  The results of significant diagnostics from this hospitalization (including imaging, microbiology, ancillary and laboratory) are listed below for reference.   Imaging Studies: DG CHEST PORT 1 VIEW  Result Date: 11/02/2022 CLINICAL DATA:  Shortness of breath EXAM: PORTABLE CHEST 1 VIEW COMPARISON:  Chest x-ray dated October 30, 2022 FINDINGS: Patient is rotated to the left. Cardiac and mediastinal contours are within normal limits. Increased opacity of the left costophrenic angle. No large pleural effusion or evidence of pneumothorax. IMPRESSION: Increased opacity of the left costophrenic angle, concerning for infection or worsening atelectasis. Electronically Signed   By: Yetta Glassman M.D.   On: 11/02/2022 09:33   DG Chest Port 1 View  Result Date: 10/30/2022 CLINICAL DATA:  Shortness of breath. EXAM: PORTABLE CHEST 1 VIEW COMPARISON:  Radiograph 11/13/2021, CT 04/06/2022 FINDINGS: Stable heart size and mediastinal contours. Mild hyperinflation and bronchial thickening. Minimal ill-defined patchy opacity in the  periphery of the right lung base. Mild scarring in the periphery of the left lung base. No pulmonary edema, pleural effusion or pneumothorax. On limited assessment, no acute osseous findings. IMPRESSION: 1. Minimal ill-defined patchy opacity in the periphery of the right lung base, may be atelectasis or pneumonia. 2. Mild hyperinflation and bronchial thickening, likely sequela of smoking. Electronically Signed   By: Keith Rake M.D.   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 Excision was recommended, however has not been performed. Patient states her COPD/breathing has been an issue. EXAM: DIGITAL DIAGNOSTIC BILATERAL MAMMOGRAM WITH TOMOSYNTHESIS TECHNIQUE: Bilateral digital diagnostic mammography and breast tomosynthesis was performed. COMPARISON:  Previous exam(s). ACR Breast Density Category b: There are scattered areas of fibroglandular density. FINDINGS: Coil shaped biopsy marking clip X is at the site of the biopsied calcifications with associated distortion in the outer posterior right breast and an X shaped biopsy marking clip at the site of biopsied calcifications in the outer slightly more anterior right breast. Calcifications and distortion appear unchanged when compared to the prior mammograms from 2021 with no new masses identified. There is no mammographic evidence of malignancy in the left breast. IMPRESSION: 1. Stable mammographic appearance of the right breast with coil shaped biopsy marking clip  in the region of the biopsied calcifications/distortion in the outer posterior right breast (biopsy proven CSL) and an X shaped biopsy marking clip site of biopsied calcifications in the outer more anterior right breast (biopsy proven ADH). 2.  No mammographic malignancy in the left breast. RECOMMENDATION: The recommendation for right breast excision remains given the pathology results of complex sclerosing lesion and atypical ductal hyperplasia. The patient states she may not be able to have surgery due to her health status/severe COPD, and if she is unable to undergo surgery than close mammographic follow-up would instead be recommended. I have discussed the findings and recommendations with the patient. If applicable, a reminder letter will be sent to the patient regarding the next appointment. BI-RADS CATEGORY  4: Suspicious. Electronically Signed  By: Everlean Alstrom M.D.   On: 10/16/2022 14:36   Microbiology: Results for orders placed or performed during the hospital encounter of 10/30/22  Blood Culture (routine x 2)     Status: None   Collection Time: 10/30/22  5:07 PM   Specimen: Left Antecubital; Blood  Result Value Ref Range Status   Specimen Description LEFT ANTECUBITAL  Final   Special Requests   Final    BOTTLES DRAWN AEROBIC AND ANAEROBIC Blood Culture adequate volume   Culture   Final    NO GROWTH 5 DAYS Performed at [facility], [address], [place], [place] [address]    Report Status 11/04/2022 FINAL  Final  Blood Culture (routine x 2)     Status: None   Collection Time: 10/30/22  5:13 PM   Specimen: Right Antecubital; Blood  Result Value Ref Range Status   Specimen Description RIGHT ANTECUBITAL  Final   Special Requests   Final    BOTTLES DRAWN AEROBIC AND ANAEROBIC Blood Culture results may not be optimal due to an excessive volume of blood received in culture bottles   Culture   Final    NO GROWTH 5 DAYS Performed at [facility], [address] [place], [place]  [address]    Report Status 11/04/2022 FINAL  Final  Resp Panel by RT-PCR (Flu A&B, Covid) Anterior Nasal Swab     Status: None   Collection Time: 10/30/22  6:00 PM   Specimen: Anterior Nasal Swab  Result Value Ref Range Status   SARS Coronavirus 2 by RT PCR NEGATIVE NEGATIVE Final    Comment: (NOTE) SARS-CoV-2 target nucleic acids are NOT DETECTED.  The SARS-CoV-2 RNA is generally detectable in upper respiratory specimens during the acute phase of infection. The lowest concentration of SARS-CoV-2 viral copies this assay can detect is 138 copies/mL. A negative result does not preclude SARS-Cov-2 infection and should not be used as the sole basis for treatment or other patient management decisions. A negative result may occur with  improper specimen collection/handling, submission of specimen other than nasopharyngeal swab, presence of viral mutation(s) within the areas targeted by this assay, and inadequate number of viral copies(<138 copies/mL). A negative result must be combined with clinical observations, patient history, and epidemiological information. The expected result is Negative.  Fact Sheet for Patients:  EntrepreneurPulse.com.au  Fact Sheet for Healthcare Providers:  IncredibleEmployment.be  This test is no t yet approved or cleared by the [place] FDA and  has been authorized for detection and/or diagnosis of SARS-CoV-2 by FDA under an Emergency Use Authorization (EUA). This EUA will remain  in effect (meaning this test can be used) for the duration of the COVID-19 declaration under Section 564(b)(1) of the Act, 21 U.S.C.section 360bbb-3(b)(1), unless the authorization is terminated  or revoked sooner.       Influenza A by PCR NEGATIVE NEGATIVE Final   Influenza B by PCR NEGATIVE NEGATIVE Final    Comment: (NOTE) The Xpert Xpress SARS-CoV-2/FLU/RSV plus assay is intended as an aid in the diagnosis of influenza from  Nasopharyngeal swab specimens and should not be used as a sole basis for treatment. Nasal washings and aspirates are unacceptable for Xpert Xpress SARS-CoV-2/FLU/RSV testing.  Fact Sheet for Patients: EntrepreneurPulse.com.au  Fact Sheet for Healthcare Providers: IncredibleEmployment.be  This test is not yet approved or cleared by the [place] FDA and has been authorized for detection and/or diagnosis of SARS-CoV-2 by FDA under an Emergency Use Authorization (EUA). This EUA will remain in  effect (meaning this test can be used) for the duration of the COVID-19 declaration under Section 564(b)(1) of the Act, 21 U.S.C. section 360bbb-3(b)(1), unless the authorization is terminated or revoked.  Performed at [facility], [address] [place], [place] [address]   Urine Culture     Status: None   Collection Time: 10/30/22  6:45 PM   Specimen: Urine, Clean Catch  Result Value Ref Range Status   Specimen Description   Final    URINE, CLEAN CATCH Performed at [facility], [address] [place], [place] [address]    Special Requests   Final    NONE Performed at [facility], [address], [place], [place] [address]    Culture   Final    NO GROWTH Performed at [facility], [place] [address], [place], [place] [address]    Report Status 10/31/2022 FINAL  Final  MRSA Next Gen by PCR, Nasal     Status: None   Collection Time: 10/31/22 10:51 AM   Specimen: Nasal Mucosa; Nasal Swab  Result Value Ref Range Status   MRSA by PCR Next Gen NOT DETECTED NOT DETECTED Final    Comment: (NOTE) The GeneXpert MRSA Assay (FDA approved for NASAL specimens only), is one component of a comprehensive MRSA colonization surveillance program. It is not intended to diagnose MRSA infection nor to guide or monitor treatment for MRSA infections. Test performance is not FDA approved in patients less than 52 years old. Performed at [facility], [address], [place], [place] [address]     Labs: CBC: Recent Labs  Lab 10/30/22 1614 10/31/22 0339 11/02/22 0511 11/03/22 0414 11/04/22 0625  WBC 10.8* 7.8 9.2 12.6* 18.8*  NEUTROABS 5.4  --   --   --   --   HGB 17.5* 16.3* 16.7* 16.4* 16.7*  HCT 53.4* 51.8* 51.8* 50.8* 52.9*  MCV 101.3* 103.8* 101.8* 101.8* 101.5*  PLT 223 191 201 216 732   Basic Metabolic Panel: Recent Labs  Lab 10/30/22 1614 10/31/22 0339 11/02/22 0511 11/03/22 0414 11/04/22 0625  NA 139 140 137 135 135  K 4.4 4.3 4.6 4.4 5.0  CL 98 99 91* 91* 91*  CO2 31 34* 39* 36* 38*  GLUCOSE 110* 130* 183* 218* 216*  BUN '9 9 19 '$ 23* 28*  CREATININE 0.59 0.55 0.47 0.56 0.53  CALCIUM 9.2 8.9 9.2 9.3 9.3   Liver Function Tests: No results for input(s): "AST", "ALT", "ALKPHOS", "BILITOT", "PROT", "ALBUMIN" in the last 168 hours. CBG: Recent Labs  Lab 11/01/22 2037 11/02/22 0723 11/02/22 1633 11/03/22 0709 11/03/22 2228  GLUCAP 133* 159* 133* 176* 241*    Discharge time spent: greater than 30 minutes.  Signed: Deatra James, MD [facility] 11/04/2022

## 2022-11-25 IMAGING — CT CT CHEST W/ CM
2 of 5 series · 11 of 36 positions shown, 13 images · IV contrast (Omnipaque or Isovue)
Comparison: CTA chest 11/14/2021

CLINICAL DATA: Pneumonia.  Productive cough.  Lymphadenopathy.

EXAM:
CT CHEST WITH CONTRAST
TECHNIQUE: Multidetector CT imaging of the chest was performed during
intravenous contrast administration.

[Series 5: coronal · coronal · 0.67mm/px · 3 of 134 slices shown]
[im 27/134  lung]
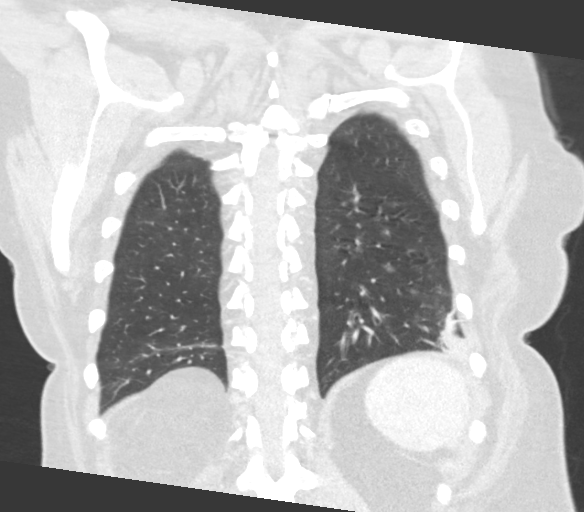
[im 54/134  lung]
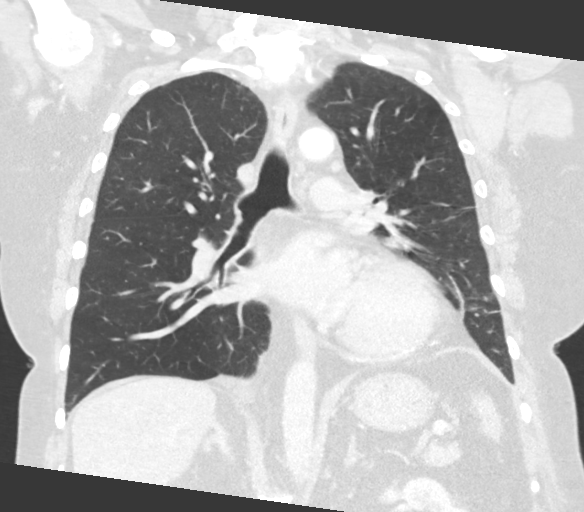
[im 80/134  lung]
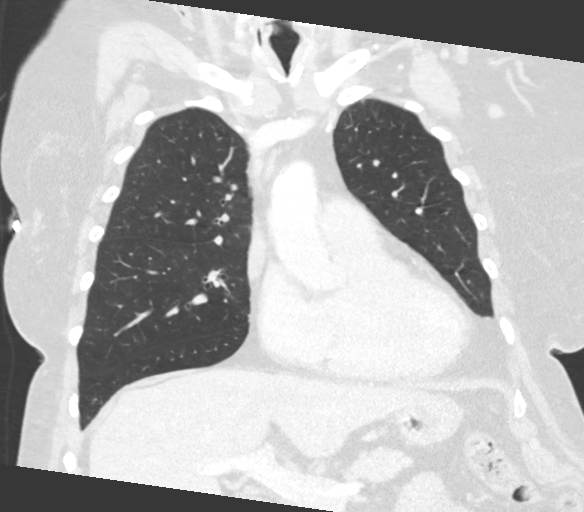

[Series 8: routine chest with · axial · 0.68mm/px · z∈[-196,+85]mm · 8 of 185 slices shown, 10 images]
[im 21/185  mediastinal]
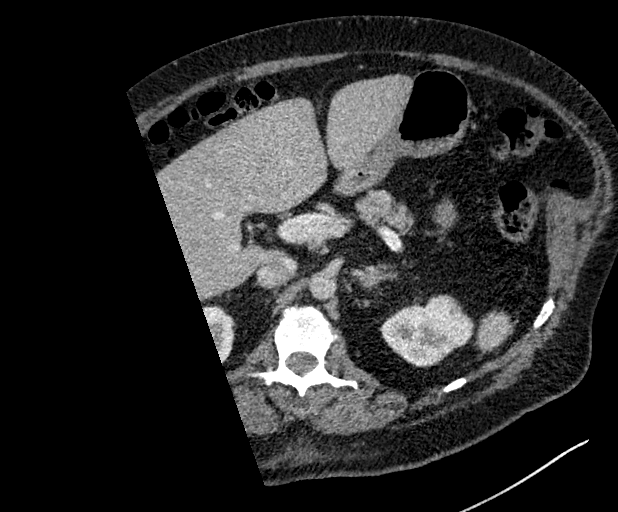
[im 21/185  lung]
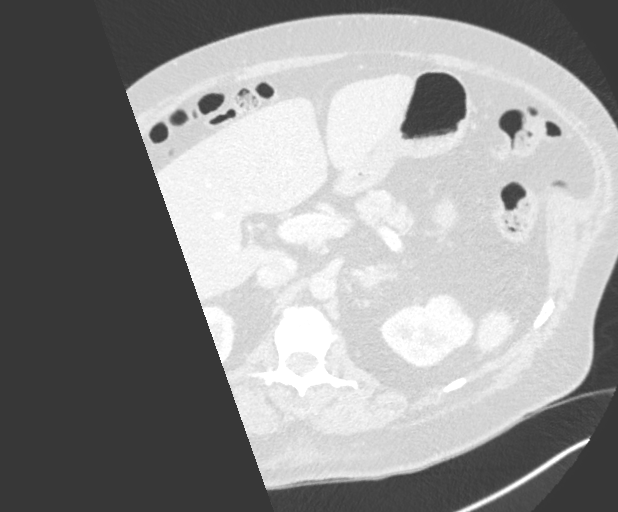
[im 41/185  lung]
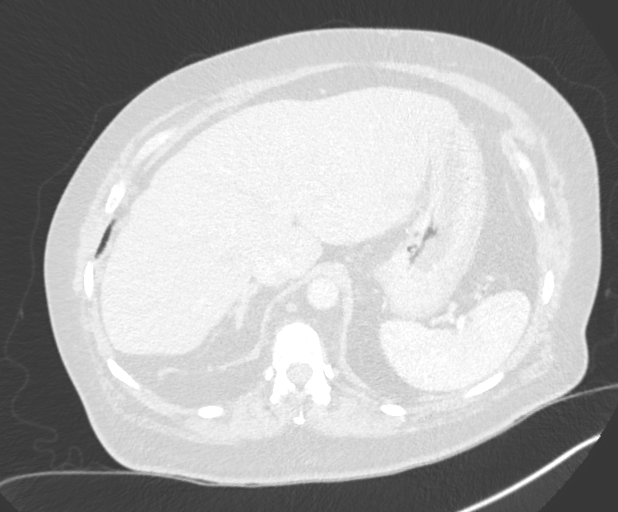
[im 62/185  lung]
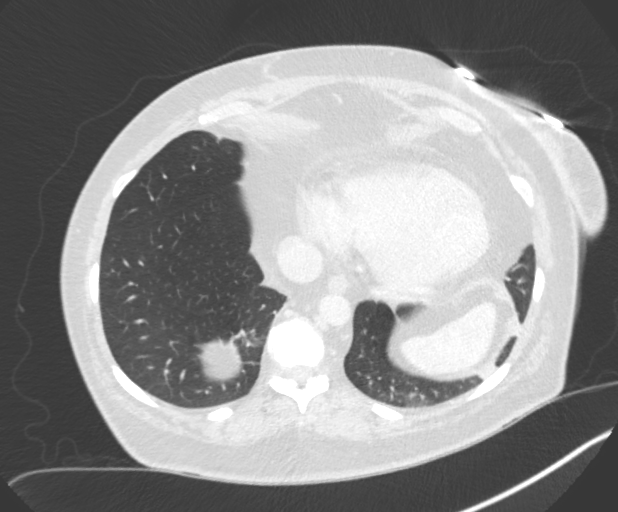
[im 82/185  lung]
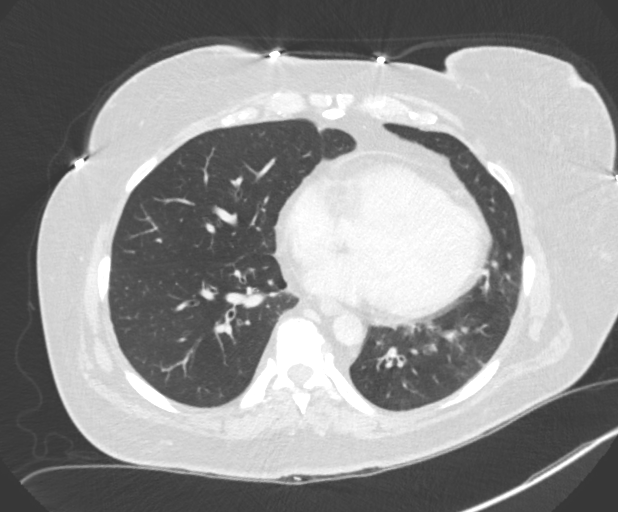
[im 103/185  mediastinal]
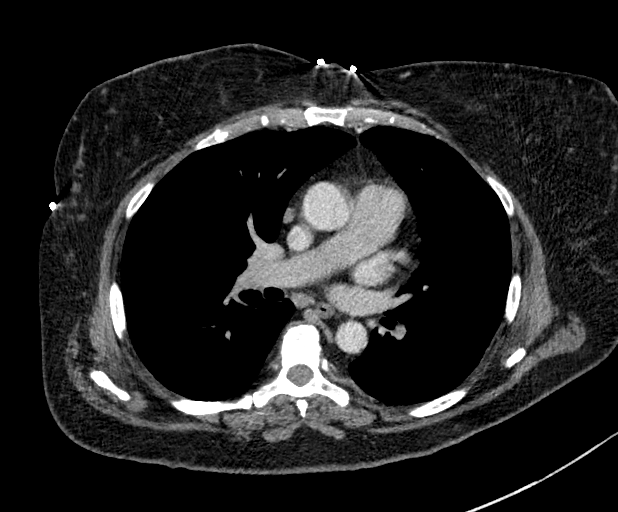
[im 103/185  lung]
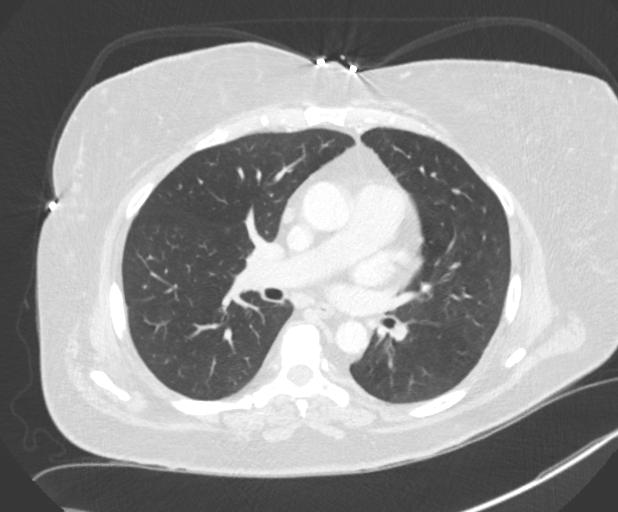
[im 123/185  lung]
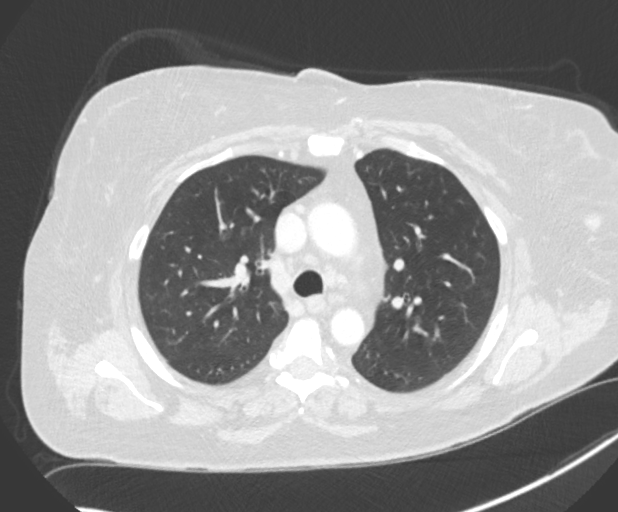
[im 144/185  lung]
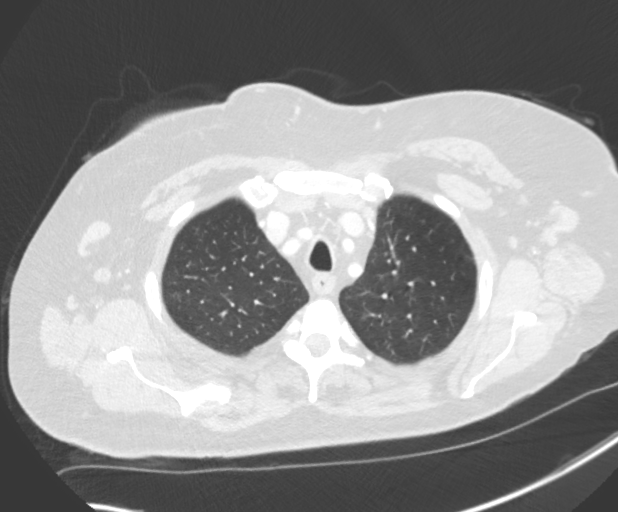
[im 164/185  lung]
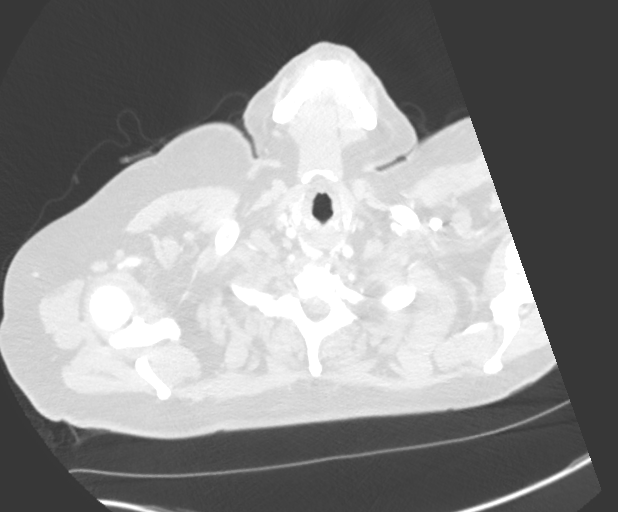

[11 of 36 positions shown; findings below may reference images not displayed]

RADIATION DOSE REDUCTION: This exam was performed according to the
departmental dose-optimization program which includes automated
exposure control, adjustment of the mA and/or kV according to
patient size and/or use of iterative reconstruction technique.

CONTRAST:  75mL OMNIPAQUE IOHEXOL 300 MG/ML  SOLN
FINDINGS: Cardiovascular: The heart size is normal. No substantial pericardial
effusion. Coronary artery calcification is evident. No thoracic
aortic aneurysm.

Mediastinum/Nodes: Mediastinal lymphadenopathy seen previously has
decreased in the interval. AP window lymph node measured previously
at 16 mm short axis is 9 mm short axis today. 8 mm short axis
subcarinal node on today's study was 13 mm short axis previously
(remeasured). There is no hilar lymphadenopathy. The esophagus has
normal imaging features. Similar appearance of mild left axillary
lymphadenopathy including 12 mm short axis posterior left axillary
previously (remeasured).

Lungs/Pleura: No suspicious pulmonary nodule or mass in the right
lung. The nodular airspace disease seen previously in the lingula
and left lower lobe has resolved with residual areas of
architectural distortion and scarring in the lingula and left lower
lobe today. No focal airspace consolidation. No pleural effusion.

Upper Abdomen: Calcified gallstone incompletely visualized. 14 mm
left adrenal nodule is stable and also unchanged comparing back to
06/22/2021.

Musculoskeletal: No worrisome lytic or sclerotic osseous
abnormality.
IMPRESSION: 1. Interval resolution of the nodular airspace disease seen
previously in the lingula and left lower lobe with residual areas of
architectural distortion and scarring in these areas today.
2. Interval decrease in mediastinal lymphadenopathy with no
substantial change in mild axillary lymphadenopathy.
3. Cholelithiasis.
4. Stable 14 mm left adrenal nodule, likely adenoma. Adrenal
protocol CT or MRI abdomen could be used to further evaluate as
clinically warranted.

## 2023-02-15 ENCOUNTER — Encounter (HOSPITAL_COMMUNITY): Payer: Self-pay | Admitting: Emergency Medicine

## 2023-02-15 ENCOUNTER — Inpatient Hospital Stay (HOSPITAL_COMMUNITY)
Admission: EM | Admit: 2023-02-15 | Discharge: 2023-02-18 | DRG: 190 | Disposition: A | Discharge status: Home / Self Care | Payer: Medicaid Other | Attending: Internal Medicine | Admitting: Internal Medicine

## 2023-02-15 ENCOUNTER — Emergency Department (HOSPITAL_COMMUNITY): Payer: Medicaid Other

## 2023-02-15 DIAGNOSIS — I5032 Chronic diastolic (congestive) heart failure: Secondary | ICD-10-CM | POA: Insufficient documentation

## 2023-02-15 DIAGNOSIS — Z806 Family history of leukemia: Secondary | ICD-10-CM | POA: Diagnosis not present

## 2023-02-15 DIAGNOSIS — J441 Chronic obstructive pulmonary disease with (acute) exacerbation: Principal | ICD-10-CM | POA: Diagnosis present

## 2023-02-15 DIAGNOSIS — E875 Hyperkalemia: Secondary | ICD-10-CM | POA: Diagnosis present

## 2023-02-15 DIAGNOSIS — Z79899 Other long term (current) drug therapy: Secondary | ICD-10-CM

## 2023-02-15 DIAGNOSIS — Z8249 Family history of ischemic heart disease and other diseases of the circulatory system: Secondary | ICD-10-CM | POA: Diagnosis not present

## 2023-02-15 DIAGNOSIS — J9621 Acute and chronic respiratory failure with hypoxia: Secondary | ICD-10-CM | POA: Diagnosis present

## 2023-02-15 DIAGNOSIS — Z6833 Body mass index (BMI) 33.0-33.9, adult: Secondary | ICD-10-CM | POA: Diagnosis not present

## 2023-02-15 DIAGNOSIS — Z8701 Personal history of pneumonia (recurrent): Secondary | ICD-10-CM

## 2023-02-15 DIAGNOSIS — Z1152 Encounter for screening for COVID-19: Secondary | ICD-10-CM

## 2023-02-15 DIAGNOSIS — F17210 Nicotine dependence, cigarettes, uncomplicated: Secondary | ICD-10-CM | POA: Diagnosis present

## 2023-02-15 DIAGNOSIS — E669 Obesity, unspecified: Secondary | ICD-10-CM | POA: Diagnosis present

## 2023-02-15 DIAGNOSIS — Z7951 Long term (current) use of inhaled steroids: Secondary | ICD-10-CM

## 2023-02-15 DIAGNOSIS — J449 Chronic obstructive pulmonary disease, unspecified: Secondary | ICD-10-CM | POA: Diagnosis present

## 2023-02-15 DIAGNOSIS — I110 Hypertensive heart disease with heart failure: Secondary | ICD-10-CM | POA: Diagnosis present

## 2023-02-15 DIAGNOSIS — I10 Essential (primary) hypertension: Secondary | ICD-10-CM | POA: Diagnosis present

## 2023-02-15 LAB — CBC WITH DIFFERENTIAL/PLATELET
Abs Immature Granulocytes: 0.04 10*3/uL (ref 0.00–0.07)
Basophils Absolute: 0 10*3/uL (ref 0.0–0.1)
Basophils Relative: 0 %
Eosinophils Absolute: 0 10*3/uL (ref 0.0–0.5)
Eosinophils Relative: 0 %
HCT: 51.4 % — ABNORMAL HIGH (ref 36.0–46.0)
Hemoglobin: 15.7 g/dL — ABNORMAL HIGH (ref 12.0–15.0)
Immature Granulocytes: 0 %
Lymphocytes Relative: 66 %
Lymphs Abs: 8.9 10*3/uL — ABNORMAL HIGH (ref 0.7–4.0)
MCH: 31 pg (ref 26.0–34.0)
MCHC: 30.5 g/dL (ref 30.0–36.0)
MCV: 101.6 fL — ABNORMAL HIGH (ref 80.0–100.0)
Monocytes Absolute: 0.6 10*3/uL (ref 0.1–1.0)
Monocytes Relative: 4 %
Neutro Abs: 4 10*3/uL (ref 1.7–7.7)
Neutrophils Relative %: 30 %
Platelets: 175 10*3/uL (ref 150–400)
RBC: 5.06 MIL/uL (ref 3.87–5.11)
RDW: 13.2 % (ref 11.5–15.5)
WBC: 13.5 10*3/uL — ABNORMAL HIGH (ref 4.0–10.5)
nRBC: 0 % (ref 0.0–0.2)

## 2023-02-15 LAB — BASIC METABOLIC PANEL
Anion gap: 7 (ref 5–15)
BUN: 9 mg/dL (ref 6–20)
CO2: 34 mmol/L — ABNORMAL HIGH (ref 22–32)
Calcium: 8.5 mg/dL — ABNORMAL LOW (ref 8.9–10.3)
Chloride: 96 mmol/L — ABNORMAL LOW (ref 98–111)
Creatinine, Ser: 0.62 mg/dL (ref 0.44–1.00)
GFR, Estimated: >60 mL/min (ref >60)
Glucose, Bld: 92 mg/dL (ref 70–99)
Potassium: 4 mmol/L (ref 3.5–5.1)
Sodium: 137 mmol/L (ref 135–145)

## 2023-02-15 LAB — RESP PANEL BY RT-PCR (RSV, FLU A&B, COVID)  RVPGX2
Influenza A by PCR: NEGATIVE
Influenza B by PCR: NEGATIVE
Resp Syncytial Virus by PCR: NEGATIVE
SARS Coronavirus 2 by RT PCR: NEGATIVE

## 2023-02-15 LAB — TROPONIN I (HIGH SENSITIVITY)
Troponin I (High Sensitivity): 15 ng/L (ref <18)
Troponin I (High Sensitivity): 17 ng/L (ref <18)

## 2023-02-15 LAB — BRAIN NATRIURETIC PEPTIDE: B Natriuretic Peptide: 612 pg/mL — ABNORMAL HIGH (ref 0.0–100.0)

## 2023-02-15 MED ORDER — FUROSEMIDE 10 MG/ML IJ SOLN
20.0000 mg | Freq: Every day | INTRAMUSCULAR | Status: DC
  Administered 2023-02-15 – 2023-02-17 (×3): 20 mg via INTRAVENOUS
  Filled 2023-02-15 (×4): qty 2

## 2023-02-15 MED ORDER — ENOXAPARIN SODIUM 40 MG/0.4ML IJ SOSY
40.0000 mg | PREFILLED_SYRINGE | INTRAMUSCULAR | Status: DC
  Administered 2023-02-15 – 2023-02-17 (×3): 40 mg via SUBCUTANEOUS
  Filled 2023-02-15 (×3): qty 0.4

## 2023-02-15 MED ORDER — IPRATROPIUM-ALBUTEROL 0.5-2.5 (3) MG/3ML IN SOLN
6.0000 mL | Freq: Once | RESPIRATORY_TRACT | Status: AC
  Administered 2023-02-15: 6 mL via RESPIRATORY_TRACT
  Filled 2023-02-15: qty 6

## 2023-02-15 MED ORDER — ONDANSETRON HCL 4 MG/2ML IJ SOLN: 4.0000 mg | Freq: Four times a day (QID) | INTRAMUSCULAR | Status: DC | PRN

## 2023-02-15 MED ORDER — AMLODIPINE BESYLATE 5 MG PO TABS
10.0000 mg | ORAL_TABLET | Freq: Every day | ORAL | Status: DC
  Administered 2023-02-16 – 2023-02-18 (×3): 10 mg via ORAL
  Filled 2023-02-15 (×3): qty 2

## 2023-02-15 MED ORDER — GABAPENTIN 100 MG PO CAPS: 100.0000 mg | ORAL_CAPSULE | Freq: Three times a day (TID) | ORAL | Status: DC | PRN

## 2023-02-15 MED ORDER — POTASSIUM CHLORIDE CRYS ER 20 MEQ PO TBCR
20.0000 meq | EXTENDED_RELEASE_TABLET | Freq: Two times a day (BID) | ORAL | Status: DC
  Administered 2023-02-15 – 2023-02-17 (×4): 20 meq via ORAL
  Filled 2023-02-15 (×4): qty 1

## 2023-02-15 MED ORDER — ENSURE ENLIVE PO LIQD
1.0000 | Freq: Once | ORAL | Status: AC
  Administered 2023-02-15: 237 mL via ORAL

## 2023-02-15 MED ORDER — MOMETASONE FURO-FORMOTEROL FUM 100-5 MCG/ACT IN AERO
2.0000 | INHALATION_SPRAY | Freq: Two times a day (BID) | RESPIRATORY_TRACT | Status: DC
  Administered 2023-02-15 – 2023-02-18 (×6): 2 via RESPIRATORY_TRACT
  Filled 2023-02-15: qty 8.8

## 2023-02-15 MED ORDER — CARVEDILOL 3.125 MG PO TABS
3.1250 mg | ORAL_TABLET | Freq: Two times a day (BID) | ORAL | Status: DC
  Administered 2023-02-16 – 2023-02-18 (×5): 3.125 mg via ORAL
  Filled 2023-02-15 (×5): qty 1

## 2023-02-15 MED ORDER — ALBUTEROL SULFATE (2.5 MG/3ML) 0.083% IN NEBU: 2.5000 mg | INHALATION_SOLUTION | RESPIRATORY_TRACT | Status: DC | PRN

## 2023-02-15 MED ORDER — METHYLPREDNISOLONE SODIUM SUCC 125 MG IJ SOLR
60.0000 mg | Freq: Two times a day (BID) | INTRAMUSCULAR | Status: AC
  Administered 2023-02-15 – 2023-02-16 (×2): 60 mg via INTRAVENOUS
  Filled 2023-02-15 (×2): qty 2

## 2023-02-15 MED ORDER — ONDANSETRON HCL 4 MG PO TABS: 4.0000 mg | ORAL_TABLET | Freq: Four times a day (QID) | ORAL | Status: DC | PRN

## 2023-02-15 MED ORDER — PREDNISONE 20 MG PO TABS
40.0000 mg | ORAL_TABLET | Freq: Every day | ORAL | Status: DC
  Administered 2023-02-16 – 2023-02-18 (×3): 40 mg via ORAL
  Filled 2023-02-15 (×4): qty 2

## 2023-02-15 MED ORDER — METHYLPREDNISOLONE SODIUM SUCC 125 MG IJ SOLR
125.0000 mg | Freq: Once | INTRAMUSCULAR | Status: AC
  Administered 2023-02-15: 125 mg via INTRAVENOUS
  Filled 2023-02-15: qty 2

## 2023-02-15 MED ORDER — IPRATROPIUM-ALBUTEROL 0.5-2.5 (3) MG/3ML IN SOLN
3.0000 mL | Freq: Four times a day (QID) | RESPIRATORY_TRACT | Status: DC
  Administered 2023-02-15 – 2023-02-17 (×6): 3 mL via RESPIRATORY_TRACT
  Filled 2023-02-15 (×7): qty 3

## 2023-02-15 NOTE — ED Notes (Signed)
ED TO INPATIENT HANDOFF REPORT  ED Nurse Name and Phone #: Benjamine Mola [phone]  S Name/Age/Gender Emily Fitzgerald 62 y.o. female Room/Bed: APA06/APA06  Code Status   Code Status: Full Code  Home/SNF/Other Home Patient oriented to: self, place, time, and situation Is this baseline? Yes   Triage Complete: Triage complete  Chief Complaint Acute on chronic respiratory failure with hypoxia ([place]) [J96.21]  Triage Note Pt arrived via [facility] c/o sob. Hx of COPD, states sob getting progressively worse. Per EMS, pt was 64% on RA, placed on 6 L [place] and pt oxygen came up to the 90s. EMS gave albuterol and oxygen came back up to 97%. Chest pain when she "coughs real hard"   Allergies No Known Allergies  Level of Care/Admitting Diagnosis ED Disposition     ED Disposition  Admit   Condition  --   [place]: [facility] [id number]  Level of Care: Med-Surg [16]  Covid Evaluation: Asymptomatic - no recent exposure (last 10 days) testing not required  Diagnosis: Acute on chronic respiratory failure with hypoxia [facility]) [id number]  Admitting Physician: Truett Mainland [[id number]]  Attending Physician: Truett Mainland [id number]  Certification:: I certify this patient will need inpatient services for at least 2 midnights  Estimated Length of Stay: 4          B Medical/Surgery History Past Medical History:  Diagnosis Date   COPD (chronic obstructive pulmonary disease) ([place])    Influenza B 12/2017   Polycythemia    Seasonal allergies    Vitamin D deficiency    Past Surgical History:  Procedure Laterality Date   CESAREAN SECTION       A IV Location/Drains/Wounds Patient Lines/Drains/Airways Status     Active Line/Drains/Airways     Name Placement date Placement time Site Days   Peripheral IV 02/15/23 20 G Left Antecubital 02/15/23  1439  Antecubital  less than 1            Intake/Output Last 24 hours No intake or output data in the 24 hours ending 02/15/23  2315  Labs/Imaging Results for orders placed or performed during the hospital encounter of 02/15/23 (from the past 48 hour(s))  Resp panel by RT-PCR (RSV, Flu A&B, Covid) Anterior Nasal Swab     Status: None   Collection Time: 02/15/23  2:47 PM   Specimen: Anterior Nasal Swab  Result Value Ref Range   SARS Coronavirus 2 by RT PCR NEGATIVE NEGATIVE    Comment: (NOTE) SARS-CoV-2 target nucleic acids are NOT DETECTED.  The SARS-CoV-2 RNA is generally detectable in upper respiratory specimens during the acute phase of infection. The lowest concentration of SARS-CoV-2 viral copies this assay can detect is 138 copies/mL. A negative result does not preclude SARS-Cov-2 infection and should not be used as the sole basis for treatment or other patient management decisions. A negative result may occur with  improper specimen collection/handling, submission of specimen other than nasopharyngeal swab, presence of viral mutation(s) within the areas targeted by this assay, and inadequate number of viral copies(<138 copies/mL). A negative result must be combined with clinical observations, patient history, and epidemiological information. The expected result is Negative.  Fact Sheet for Patients:  EntrepreneurPulse.com.au  Fact Sheet for Healthcare Providers:  IncredibleEmployment.be  This test is no t yet approved or cleared by the [place] FDA and  has been authorized for detection and/or diagnosis of SARS-CoV-2 by FDA under an Emergency Use Authorization (EUA). This EUA will remain  in effect (  meaning this test can be used) for the duration of the COVID-19 declaration under Section 564(b)(1) of the Act, 21 U.S.C.section 360bbb-3(b)(1), unless the authorization is terminated  or revoked sooner.       Influenza A by PCR NEGATIVE NEGATIVE   Influenza B by PCR NEGATIVE NEGATIVE    Comment: (NOTE) The Xpert Xpress SARS-CoV-2/FLU/RSV plus assay is  intended as an aid in the diagnosis of influenza from Nasopharyngeal swab specimens and should not be used as a sole basis for treatment. Nasal washings and aspirates are unacceptable for Xpert Xpress SARS-CoV-2/FLU/RSV testing.  Fact Sheet for Patients: EntrepreneurPulse.com.au  Fact Sheet for Healthcare Providers: IncredibleEmployment.be  This test is not yet approved or cleared by the [place] FDA and has been authorized for detection and/or diagnosis of SARS-CoV-2 by FDA under an Emergency Use Authorization (EUA). This EUA will remain in effect (meaning this test can be used) for the duration of the COVID-19 declaration under Section 564(b)(1) of the Act, 21 U.S.C. section 360bbb-3(b)(1), unless the authorization is terminated or revoked.     Resp Syncytial Virus by PCR NEGATIVE NEGATIVE    Comment: (NOTE) Fact Sheet for Patients: EntrepreneurPulse.com.au  Fact Sheet for Healthcare Providers: IncredibleEmployment.be  This test is not yet approved or cleared by the [place] FDA and has been authorized for detection and/or diagnosis of SARS-CoV-2 by FDA under an Emergency Use Authorization (EUA). This EUA will remain in effect (meaning this test can be used) for the duration of the COVID-19 declaration under Section 564(b)(1) of the Act, 21 U.S.C. section 360bbb-3(b)(1), unless the authorization is terminated or revoked.  Performed at [facility], [address], [place], [place] [id number]   Basic metabolic panel     Status: Abnormal   Collection Time: 02/15/23  2:59 PM  Result Value Ref Range   Sodium 137 135 - 145 mmol/L   Potassium 4.0 3.5 - 5.1 mmol/L   Chloride 96 (L) 98 - 111 mmol/L   CO2 34 (H) 22 - 32 mmol/L   Glucose, Bld 92 70 - 99 mg/dL    Comment: Glucose reference range applies only to samples taken after fasting for at least 8 hours.   BUN 9 6 - 20 mg/dL   Creatinine, Ser  0.62 0.44 - 1.00 mg/dL   Calcium 8.5 (L) 8.9 - 10.3 mg/dL   GFR, Estimated >60 >60 mL/min    Comment: (NOTE) Calculated using the CKD-EPI Creatinine Equation (2021)    Anion gap 7 5 - 15    Comment: Performed at [facility], [address] [place], [place] [address]  Brain natriuretic peptide     Status: Abnormal   Collection Time: 02/15/23  2:59 PM  Result Value Ref Range   B Natriuretic Peptide 612.0 (H) 0.0 - 100.0 pg/mL    Comment: Performed at [facility], [address] [place],  [address]  CBC with Differential     Status: Abnormal   Collection Time: 02/15/23  2:59 PM  Result Value Ref Range   WBC 13.5 (H) 4.0 - 10.5 K/uL   RBC 5.06 3.87 - 5.11 MIL/uL   Hemoglobin 15.7 (H) 12.0 - 15.0 g/dL   HCT 51.4 (H) 36.0 - 46.0 %   MCV 101.6 (H) 80.0 - 100.0 fL   MCH 31.0 26.0 - 34.0 pg   MCHC 30.5 30.0 - 36.0 g/dL   RDW 13.2 11.5 - 15.5 %   Platelets 175 150 - 400 K/uL   nRBC 0.0 0.0 - 0.2 %  Neutrophils Relative % 30 %   Neutro Abs 4.0 1.7 - 7.7 K/uL   Lymphocytes Relative 66 %   Lymphs Abs 8.9 (H) 0.7 - 4.0 K/uL   Monocytes Relative 4 %   Monocytes Absolute 0.6 0.1 - 1.0 K/uL   Eosinophils Relative 0 %   Eosinophils Absolute 0.0 0.0 - 0.5 K/uL   Basophils Relative 0 %   Basophils Absolute 0.0 0.0 - 0.1 K/uL   Smear Review MORPHOLOGY UNREMARKABLE    Immature Granulocytes 0 %   Abs Immature Granulocytes 0.04 0.00 - 0.07 K/uL   Reactive, Benign Lymphocytes PRESENT    Smudge Cells PRESENT    Polychromasia PRESENT    Stomatocytes PRESENT     Comment: Performed at [facility], [address], [place], [place] [address]  Troponin I (High Sensitivity)     Status: None   Collection Time: 02/15/23  2:59 PM  Result Value Ref Range   Troponin I (High Sensitivity) 15 <18 ng/L    Comment: (NOTE) Elevated high sensitivity troponin I (hsTnI) values and significant  changes across serial measurements may suggest ACS but many other  chronic and acute conditions are known to  elevate hsTnI results.  Refer to the "Links" section for chest pain algorithms and additional  guidance. Performed at [facility], [address] [place], [place] [address]   Troponin I (High Sensitivity)     Status: None   Collection Time: 02/15/23  4:44 PM  Result Value Ref Range   Troponin I (High Sensitivity) 17 <18 ng/L    Comment: (NOTE) Elevated high sensitivity troponin I (hsTnI) values and significant  changes across serial measurements may suggest ACS but many other  chronic and acute conditions are known to elevate hsTnI results.  Refer to the "Links" section for chest pain algorithms and additional  guidance. Performed at [facility], [address] [place], [place] [address]    DG Chest 2 View  Result Date: 02/15/2023 CLINICAL DATA:  Shortness of breath.  History of COPD. EXAM: CHEST - 2 VIEW COMPARISON:  Chest radiographs 11/02/2022, 10/30/2022, 11/13/2021; CT chest 04/06/2022 FINDINGS: Cardiac silhouette is again mildly enlarged. Mediastinal contours are within normal limits. Mild left mid and lower lung linear scarring is similar to multiple prior radiographs and CT. The right lung is clear. No pleural effusion or pneumothorax. Mild-to-moderate multilevel degenerative disc changes of the thoracic spine. IMPRESSION: 1. No active cardiopulmonary disease. 2. Mild left lung base scarring, similar to multiple prior radiographs and CT. Electronically Signed   By: Yvonne Kendall M.D.   On: 02/15/2023 15:31    Pending Labs Unresulted Labs (From admission, onward)     Start     Ordered   02/16/23 [id number]  Basic metabolic panel  Tomorrow morning,   R        02/15/23 1914   02/16/23 0500  CBC  Tomorrow morning,   R        02/15/23 1914   02/16/23 0500  HIV Antibody (routine testing w rflx)  Once,   R        02/16/23 0500   02/15/23 1459  Pathologist smear review  Once,   R        02/15/23 1459            Vitals/Pain Today's Vitals   02/15/23 1915 02/15/23 2100 02/15/23  2203 02/15/23 2300  BP:  (!) 153/59  (!) 125/109  Fitzgerald:  93  94  Resp:  18  18  Temp:  98.1 F (36.7 C)  98.4 F (36.9 C)  TempSrc:  Oral  Oral  SpO2: 92% 92% 92% 93%  Weight:      Height:      PainSc:        Isolation Precautions No active isolations  Medications Medications  amLODipine (NORVASC) tablet 10 mg (10 mg Oral Not Given 02/15/23 1947)  gabapentin (NEURONTIN) capsule 100 mg (has no administration in time range)  mometasone-formoterol (DULERA) 100-5 MCG/ACT inhaler 2 puff (2 puffs Inhalation Given 02/15/23 2203)  methylPREDNISolone sodium succinate (SOLU-MEDROL) 125 mg/2 mL injection 60 mg (60 mg Intravenous Given 02/15/23 1949)    Followed by  predniSONE (DELTASONE) tablet 40 mg (has no administration in time range)  ipratropium-albuterol (DUONEB) 0.5-2.5 (3) MG/3ML nebulizer solution 3 mL (3 mLs Nebulization Given 02/15/23 2203)  albuterol (PROVENTIL) (2.5 MG/3ML) 0.083% nebulizer solution 2.5 mg (has no administration in time range)  furosemide (LASIX) injection 20 mg (20 mg Intravenous Given 02/15/23 1951)  potassium chloride SA (KLOR-CON M) CR tablet 20 mEq (20 mEq Oral Given 02/15/23 2221)  carvedilol (COREG) tablet 3.125 mg (has no administration in time range)  enoxaparin (LOVENOX) injection 40 mg (40 mg Subcutaneous Given 02/15/23 2220)  ondansetron (ZOFRAN) tablet 4 mg (has no administration in time range)    Or  ondansetron (ZOFRAN) injection 4 mg (has no administration in time range)  ipratropium-albuterol (DUONEB) 0.5-2.5 (3) MG/3ML nebulizer solution 6 mL (6 mLs Nebulization Given 02/15/23 1650)  methylPREDNISolone sodium succinate (SOLU-MEDROL) 125 mg/2 mL injection 125 mg (125 mg Intravenous Given 02/15/23 1607)  feeding supplement (ENSURE ENLIVE / ENSURE PLUS) liquid 237 mL (237 mLs Oral Given 02/15/23 1731)    Mobility walks     Focused Assessments Pulmonary Assessment Handoff:  Lung sounds:   O2 Device: Nasal Cannula O2 Flow Rate (L/min): 4  L/min    R Recommendations: See Admitting Provider Note  Report given to:   Additional Notes: Alert and oriented. Very nice.

## 2023-02-15 NOTE — ED Notes (Signed)
Emptied pts urine canister; 1,000 mL noted prior to emptying

## 2023-02-15 NOTE — ED Notes (Signed)
Pt states she is chronically on 3-6 L [place] at home during the night

## 2023-02-15 NOTE — H&P (Signed)
History and Physical    Patient: Emily Fitzgerald [id number] DOB: [birth year] DOA: 02/15/2023 DOS: the patient was seen and examined on 02/15/2023 PCP: [facility]  Patient coming from: Home  Chief Complaint:  Chief Complaint  Patient presents with   Shortness of Breath   HPI: Emily Fitzgerald is a 62 y.o. female with medical history significant of COPD, tobacco abuse, chronic respiratory failure intermittently on oxygen.  Patient has been having some mild orthopnea and recently had an echo done 3 to 4 days ago which showed a normal EF of 60 to 65% but grade 1 diastolic heart failure.  The patient has been noticing that she has been requiring increased use of her home oxygen and has been becoming more frequently short of breath with dyspnea on exertion.  She has been coughing her normal amount and with no increased sputum production.  The nature of her sputum has not changed either -she notes that it is mostly clear.  No fevers, chills, nausea, vomiting.  Review of Systems: As mentioned in the history of present illness. All other systems reviewed and are negative. Past Medical History:  Diagnosis Date   COPD (chronic obstructive pulmonary disease) ([place])    Influenza B 12/2017   Polycythemia    Seasonal allergies    Vitamin D deficiency    Past Surgical History:  Procedure Laterality Date   CESAREAN SECTION     Social History:  reports that she has been smoking cigarettes. She has a 18.50 pack-year smoking history. She has never used smokeless tobacco. She reports current alcohol use. She reports that she does not use drugs.  No Known Allergies  Family History  Problem Relation Age of Onset   Cancer Mother    Leukemia Father    CAD Brother     Prior to Admission medications   Medication Sig Start Date End Date Taking? Authorizing Provider  albuterol (VENTOLIN HFA) 108 (90 Base) MCG/ACT inhaler Inhale 1-2 puffs into the lungs every 6 (six) hours as needed  for wheezing or shortness of breath. Patient taking differently: Inhale 2 puffs into the lungs every 6 (six) hours as needed for wheezing or shortness of breath. 07/17/21   Chesley Mires, MD  amLODipine (NORVASC) 10 MG tablet Take 1 tablet (10 mg total) by mouth daily. 11/05/22 01/04/23  Shahmehdi, Valeria Batman, MD  Azelastine HCl (ASTEPRO) 0.15 % SOLN Place 1 spray into the nose every morning. 11/04/22 01/03/23  Deatra James, MD  budesonide-formoterol (SYMBICORT) 80-4.5 MCG/ACT inhaler Inhale 2 puffs into the lungs 2 (two) times daily. 11/04/22 12/04/22  Shahmehdi, Valeria Batman, MD  gabapentin (NEURONTIN) 100 MG capsule Take 100 mg by mouth 3 (three) times daily as needed (for pain).  10/02/18   [provider]  guaiFENesin-dextromethorphan (ROBITUSSIN DM) 100-10 MG/5ML syrup Take 10 mLs by mouth every 6 (six) hours. 11/04/22   Shahmehdi, Valeria Batman, MD  ipratropium-albuterol (DUONEB) 0.5-2.5 (3) MG/3ML SOLN Take 3 mLs by nebulization in the morning, at noon, and at bedtime. 11/04/22   Shahmehdi, Valeria Batman, MD  methylPREDNISolone (MEDROL DOSEPAK) 4 MG TBPK tablet Medrol Dosepak take as instructed 11/04/22   Shahmehdi, Erling Conte A, MD  OXYGEN Inhale 3 L into the lungs continuous.    [provider]    Physical Exam: Vitals:   02/15/23 1600 02/15/23 1630 02/15/23 1650 02/15/23 1700  BP: 137/74 (!) 150/78  (!) 149/73  Pulse: 85 77  92  Resp:      Temp:  TempSrc:      SpO2: 92% 94% 93% 93%  Weight:      Height:       General: middle age female. Awake and alert and oriented x3. No acute cardiopulmonary distress.  HEENT: Normocephalic atraumatic.  Right and left ears normal in appearance.  Pupils equal, round, reactive to light. Extraocular muscles are intact. Sclerae anicteric and noninjected.  Moist mucosal membranes. No mucosal lesions.  Neck: Neck supple without lymphadenopathy. No carotid bruits. No masses palpated.  Cardiovascular: wheezing throughout with rales in bases bilaterally. No  JVD.  Respiratory: Good respiratory effort with no wheezes, rales, rhonchi. Lungs clear to auscultation bilaterally.  No accessory muscle use. Abdomen: Soft, nontender, nondistended. Active bowel sounds. No masses or hepatosplenomegaly  Skin: No rashes, lesions, or ulcerations.  Dry, warm to touch. 2+ dorsalis pedis and radial pulses. Musculoskeletal: No calf or leg pain. All major joints not erythematous nontender.  No upper or lower joint deformation.  Good ROM.  No contractures  Psychiatric: Intact judgment and insight. Pleasant and cooperative. Neurologic: No focal neurological deficits. Strength is 5/5 and symmetric in upper and lower extremities.  Cranial nerves II through XII are grossly intact.   Data Reviewed: Results for orders placed or performed during the hospital encounter of 02/15/23 (from the past 24 hour(s))  Resp panel by RT-PCR (RSV, Flu A&B, Covid) Anterior Nasal Swab     Status: None   Collection Time: 02/15/23  2:47 PM   Specimen: Anterior Nasal Swab  Result Value Ref Range   SARS Coronavirus 2 by RT PCR NEGATIVE NEGATIVE   Influenza A by PCR NEGATIVE NEGATIVE   Influenza B by PCR NEGATIVE NEGATIVE   Resp Syncytial Virus by PCR NEGATIVE NEGATIVE  Basic metabolic panel     Status: Abnormal   Collection Time: 02/15/23  2:59 PM  Result Value Ref Range   Sodium 137 135 - 145 mmol/L   Potassium 4.0 3.5 - 5.1 mmol/L   Chloride 96 (L) 98 - 111 mmol/L   CO2 34 (H) 22 - 32 mmol/L   Glucose, Bld 92 70 - 99 mg/dL   BUN 9 6 - 20 mg/dL   Creatinine, Ser 0.62 0.44 - 1.00 mg/dL   Calcium 8.5 (L) 8.9 - 10.3 mg/dL   GFR, Estimated >60 >60 mL/min   Anion gap 7 5 - 15  Brain natriuretic peptide     Status: Abnormal   Collection Time: 02/15/23  2:59 PM  Result Value Ref Range   B Natriuretic Peptide 612.0 (H) 0.0 - 100.0 pg/mL  CBC with Differential     Status: Abnormal   Collection Time: 02/15/23  2:59 PM  Result Value Ref Range   WBC 13.5 (H) 4.0 - 10.5 K/uL   RBC 5.06  3.87 - 5.11 MIL/uL   Hemoglobin 15.7 (H) 12.0 - 15.0 g/dL   HCT 51.4 (H) 36.0 - 46.0 %   MCV 101.6 (H) 80.0 - 100.0 fL   MCH 31.0 26.0 - 34.0 pg   MCHC 30.5 30.0 - 36.0 g/dL   RDW 13.2 11.5 - 15.5 %   Platelets 175 150 - 400 K/uL   nRBC 0.0 0.0 - 0.2 %   Neutrophils Relative % 30 %   Neutro Abs 4.0 1.7 - 7.7 K/uL   Lymphocytes Relative 66 %   Lymphs Abs 8.9 (H) 0.7 - 4.0 K/uL   Monocytes Relative 4 %   Monocytes Absolute 0.6 0.1 - 1.0 K/uL   Eosinophils Relative 0 %  Eosinophils Absolute 0.0 0.0 - 0.5 K/uL   Basophils Relative 0 %   Basophils Absolute 0.0 0.0 - 0.1 K/uL   Smear Review MORPHOLOGY UNREMARKABLE    Immature Granulocytes 0 %   Abs Immature Granulocytes 0.04 0.00 - 0.07 K/uL   Reactive, Benign Lymphocytes PRESENT    Smudge Cells PRESENT    Polychromasia PRESENT    Stomatocytes PRESENT   Troponin I (High Sensitivity)     Status: None   Collection Time: 02/15/23  2:59 PM  Result Value Ref Range   Troponin I (High Sensitivity) 15 <18 ng/L  Troponin I (High Sensitivity)     Status: None   Collection Time: 02/15/23  4:44 PM  Result Value Ref Range   Troponin I (High Sensitivity) 17 <18 ng/L    DG Chest 2 View  Result Date: 02/15/2023 CLINICAL DATA:  Shortness of breath.  History of COPD. EXAM: CHEST - 2 VIEW COMPARISON:  Chest radiographs 11/02/2022, 10/30/2022, 11/13/2021; CT chest 04/06/2022 FINDINGS: Cardiac silhouette is again mildly enlarged. Mediastinal contours are within normal limits. Mild left mid and lower lung linear scarring is similar to multiple prior radiographs and CT. The right lung is clear. No pleural effusion or pneumothorax. Mild-to-moderate multilevel degenerative disc changes of the thoracic spine. IMPRESSION: 1. No active cardiopulmonary disease. 2. Mild left lung base scarring, similar to multiple prior radiographs and CT. Electronically Signed   By: Yvonne Kendall M.D.   On: 02/15/2023 15:31     Assessment and Plan: No notes have been filed  under this hospital service. Service: Hospitalist  Principal Problem:   Acute on chronic respiratory failure with hypoxia (HCC) Active Problems:   COPD with acute exacerbation (HCC)   HTN (hypertension)   Chronic diastolic CHF (congestive heart failure) (HCC)  Acute on chronic respiratory failure with hypoxia secondary to COPD exacerbation Antibiotics: none DuoNeb's every 6 scheduled with albuterol every 2 when necessary Continue inhaled steroids and LA bronchodilator Solu-Medrol 60 mg IV every 12 hours Mucinex Chronic diastolic heart failure BNP elevated. No acute pulmonary edema.  Echo 4 days ago (in [place]) shows HFpEF Start lasix 20mg  daily Will start carvedilol as well Hypertension Amlodipine and carvedilol   Advance Care Planning:   Code Status: Full Code   Consults:   Family Communication: none  Severity of Illness: The appropriate patient status for this patient is INPATIENT. Inpatient status is judged to be reasonable and necessary in order to provide the required intensity of service to ensure the patient's safety. The patient's presenting symptoms, physical exam findings, and initial radiographic and laboratory data in the context of their chronic comorbidities is felt to place them at high risk for further clinical deterioration. Furthermore, it is not anticipated that the patient will be medically stable for discharge from the hospital within 2 midnights of admission.   * I certify that at the point of admission it is my clinical judgment that the patient will require inpatient hospital care spanning beyond 2 midnights from the point of admission due to high intensity of service, high risk for further deterioration and high frequency of surveillance required.*  Author: Truett Mainland, DO 02/15/2023 5:38 PM  For on call review [email or web address].

## 2023-02-15 NOTE — ED Provider Notes (Signed)
[facility] Provider Note   CSN: [id number] Arrival date & time: 02/15/23  1417     History  Chief Complaint  Patient presents with   Shortness of Breath    Emily Fitzgerald is Fitzgerald 62 y.o. female.  With Fitzgerald history of COPD, allergies who presents to the ED for evaluation of shortness of breath on exertion.  States this began 2 days ago and has progressively gotten worse.  Also reporting body aches.  She has as needed oxygen at home and has been having to wear it at night since the symptoms began.  Does not have Fitzgerald pulse ox measurement device at home and does not know what her pulse ox typically is.  She has Fitzgerald chronic cough which has gotten worse recently.  It is occasionally productive of clear sputum.  She reports anterior chest wall pain when she coughs but denies other chest pain.  States she occasionally develops lightheadedness during ambulation.  She does not know if she has had any sick contacts.  She states her symptoms feel similar to when she was diagnosed with pneumonia in December of last year.  She denies fevers, nausea, vomiting, diarrhea, history of CHF, unilateral or bilateral lower extremity swelling, recent travel, surgery, cancer treatments, hemoptysis.  She does not take Fitzgerald blood thinner.  EMS reportedly found Fitzgerald pulse ox of 64% on room air on arrival.  Patient was started on 4 L nasal cannula and albuterol nebulizer was given with improvement to 95%.   Shortness of Breath Associated symptoms: cough        Home Medications Prior to Admission medications   Medication Sig Start Date End Date Taking? Authorizing Provider  albuterol (VENTOLIN HFA) 108 (90 Base) MCG/ACT inhaler Inhale 1-2 puffs into the lungs every 6 (six) hours as needed for wheezing or shortness of breath. Patient taking differently: Inhale 2 puffs into the lungs every 6 (six) hours as needed for wheezing or shortness of breath. 07/17/21   Emily Mires, Fitzgerald  amLODipine  (NORVASC) 10 MG tablet Take 1 tablet (10 mg total) by mouth daily. 11/05/22 01/04/23  Emily Fitzgerald, Emily Batman, Fitzgerald  Azelastine HCl (ASTEPRO) 0.15 % SOLN Place 1 spray into the nose every morning. 11/04/22 01/03/23  Emily James, Fitzgerald  budesonide-formoterol (SYMBICORT) 80-4.5 MCG/ACT inhaler Inhale 2 puffs into the lungs 2 (two) times daily. 11/04/22 12/04/22  Emily Fitzgerald, Emily Batman, Fitzgerald  gabapentin (NEURONTIN) 100 MG capsule Take 100 mg by mouth 3 (three) times daily as needed (for pain).  10/02/18   Emily Fitzgerald  guaiFENesin-dextromethorphan (ROBITUSSIN DM) 100-10 MG/5ML syrup Take 10 mLs by mouth every 6 (six) hours. 11/04/22   Emily Fitzgerald, Emily Batman, Fitzgerald  ipratropium-albuterol (DUONEB) 0.5-2.5 (3) MG/3ML SOLN Take 3 mLs by nebulization in the morning, at noon, and at bedtime. 11/04/22   Emily Fitzgerald, Emily Batman, Fitzgerald  methylPREDNISolone (MEDROL DOSEPAK) 4 MG TBPK tablet Medrol Dosepak take as instructed 11/04/22   Emily Fitzgerald, Emily Conte Fitzgerald, Fitzgerald  OXYGEN Inhale 3 L into the lungs continuous.    Emily Fitzgerald      Allergies    Patient has no known allergies.    Review of Systems   Review of Systems  Respiratory:  Positive for cough and shortness of breath.   All other systems reviewed and are negative.   Physical Exam Updated Vital Signs BP (!) 149/73   Pulse 92   Temp 98 F (36.7 C) (Oral)   Resp (!) 23  Ht 5\' 7"  (1.702 m)   Wt 98.3 kg   SpO2 93%   BMI 33.94 kg/m  Physical Exam Vitals and nursing note reviewed.  Constitutional:      General: She is not in acute distress.    Appearance: She is well-developed. She is obese.     Comments: Resting comfortably in bed  HENT:     Head: Normocephalic and atraumatic.  Eyes:     Conjunctiva/sclera: Conjunctivae normal.  Cardiovascular:     Rate and Rhythm: Normal rate and regular rhythm.     Heart sounds: No murmur heard. Pulmonary:     Effort: Pulmonary effort is normal. No tachypnea or respiratory distress.     Breath sounds: Examination  of the right-lower field reveals rales. Examination of the left-lower field reveals rales. Rales present. No decreased breath sounds or wheezing.  Abdominal:     Palpations: Abdomen is soft.     Tenderness: There is no abdominal tenderness. There is no guarding.  Musculoskeletal:        General: No swelling.     Cervical back: Neck supple.     Right lower leg: No edema.     Left lower leg: No edema.  Skin:    General: Skin is warm and dry.     Capillary Refill: Capillary refill takes less than 2 seconds.     Coloration: Skin is not cyanotic.     Findings: No erythema.  Neurological:     General: No focal deficit present.     Mental Status: She is alert and oriented to person, place, and time.  Psychiatric:        Mood and Affect: Mood normal.     ED Results / Procedures / Treatments   Labs (all labs ordered are listed, but only abnormal results are displayed) Labs Reviewed  BASIC METABOLIC PANEL - Abnormal; Notable for the following components:      Result Value   Chloride 96 (*)    CO2 34 (*)    Calcium 8.5 (*)    All other components within normal limits  BRAIN NATRIURETIC PEPTIDE - Abnormal; Notable for the following components:   B Natriuretic Peptide 612.0 (*)    All other components within normal limits  CBC WITH DIFFERENTIAL/PLATELET - Abnormal; Notable for the following components:   WBC 13.5 (*)    Hemoglobin 15.7 (*)    HCT 51.4 (*)    MCV 101.6 (*)    Lymphs Abs 8.9 (*)    All other components within normal limits  RESP PANEL BY RT-PCR (RSV, FLU Fitzgerald&B, COVID)  [id number]  PATHOLOGIST SMEAR REVIEW  TROPONIN I (HIGH SENSITIVITY)  TROPONIN I (HIGH SENSITIVITY)    EKG EKG Interpretation  Date/Time:  Friday February 15 2023 14:33:11 EDT Ventricular Rate:  88 PR Interval:  156 QRS Duration: 83 QT Interval:  363 QTC Calculation: 440 R Axis:   204 Text Interpretation: Sinus rhythm Anterolateral infarct, age indeterminate Since last tracing rate slower Confirmed by  Emily Fitzgerald [phone]) on 02/15/2023 3:55:36 PM  Radiology DG Chest 2 View  Result Date: 02/15/2023 CLINICAL DATA:  Shortness of breath.  History of COPD. EXAM: CHEST - 2 VIEW COMPARISON:  Chest radiographs 11/02/2022, 10/30/2022, 11/13/2021; CT chest 04/06/2022 FINDINGS: Cardiac silhouette is again mildly enlarged. Mediastinal contours are within normal limits. Mild left mid and lower lung linear scarring is similar to multiple prior radiographs and CT. The right lung is clear. No pleural effusion or pneumothorax. Mild-to-moderate multilevel degenerative disc  changes of the thoracic spine. IMPRESSION: 1. No active cardiopulmonary disease. 2. Mild left lung base scarring, similar to multiple prior radiographs and CT. Electronically Signed   By: Yvonne Kendall M.D.   On: 02/15/2023 15:31    Procedures Procedures    Medications Ordered in ED Medications  feeding supplement (ENSURE ENLIVE / ENSURE PLUS) liquid 237 mL (has no administration in time range)  ipratropium-albuterol (DUONEB) 0.5-2.5 (3) MG/3ML nebulizer solution 6 mL (6 mLs Nebulization Given 02/15/23 1650)  methylPREDNISolone sodium succinate (SOLU-MEDROL) 125 mg/2 mL injection 125 mg (125 mg Intravenous Given 02/15/23 1607)    ED Course/ Medical Decision Making/ Fitzgerald&P Clinical Course as of 02/15/23 1708  Fri Feb 15, 2023  1703 Spoke with Dr. Nehemiah Settle who will admit [AS]    Clinical Course User Index [AS] Alfonza Toft, [place] Folk, PA-C                             Medical Decision Making Amount and/or Complexity of Data Reviewed Labs: ordered. Radiology: ordered.  This patient presents to the ED for concern of shortness of breath, this involves an extensive number of treatment options, and is Fitzgerald complaint that carries with it Fitzgerald high risk of complications and morbidity.  The emergent differential diagnosis for shortness of breath includes, but is not limited to, Pulmonary edema, bronchoconstriction, Pneumonia, Pulmonary embolism,  Pneumotherax/ Hemothorax, Dysrythmia, ACS.    Co morbidities that complicate the patient evaluation  COPD, allergies  My initial workup includes shortness of breath workup with labs and imaging  Additional history obtained from: Nursing notes from this visit. Previous records within EMR system echo on 02/12/2023 with an EF of 65-70% EMS provides Fitzgerald portion of the history  I ordered, reviewed and interpreted labs which include: CBC, BMP, BNP, troponin, respiratory panel.  Leukocytosis of 13.5.  Slightly elevated bicarb of 34.  BNP of 612.  I ordered imaging studies including chest x-ray I independently visualized and interpreted imaging which showed chronic left lung base scarring I agree with the radiologist interpretation  Cardiac Monitoring:  The patient was maintained on Fitzgerald cardiac monitor.  I personally viewed and interpreted the cardiac monitored which showed an underlying rhythm of: NSR  Consultations Obtained:  I requested consultation with the hospitalist Dr. Sheran Lawless,  and discussed lab and imaging findings as well as pertinent plan - they recommend: Admission  Afebrile, hypoxic but otherwise hemodynamically stable.  62 year old female presenting to the ED for evaluation of shortness of breath.  She does have COPD and is Fitzgerald pack per day smoker.  On exam she has rhonchi in bilateral lower lobes.  Likely due to COPD exacerbation.  She does not have lower extremity pitting edema.  She was on 4 L nasal cannula initially which was reduced to 2 L nasal cannula after interventions.  Lab workup significant for slight leukocytosis, increased bicarb, increased BNP.  Echocardiogram 3 days ago showed an EF of 65 to 70%.  Patient was given Solu-Medrol and DuoNeb treatment and admitted to hospitalist for COPD exacerbation.  Stable at the time of admission.  Patient's case discussed with Dr. Gilford Raid who agrees with plan to admit.   Note: Portions of this report may have been transcribed using  voice recognition software. Every effort was made to ensure accuracy; however, inadvertent computerized transcription errors may still be present.        Final Clinical Impression(s) / ED Diagnoses Final diagnoses:  COPD exacerbation ([place])  Rx / DC Orders ED Discharge Orders     None         Nehemiah Massed 02/15/23 1708    Milton Ferguson, Fitzgerald 02/15/23 [phone]

## 2023-02-15 NOTE — ED Triage Notes (Signed)
Pt arrived via [facility] c/o sob. Hx of COPD, states sob getting progressively worse. Per EMS, pt was 64% on RA, placed on 6 L [place] and pt oxygen came up to the 90s. EMS gave albuterol and oxygen came back up to 97%. Chest pain when she "coughs real hard"

## 2023-02-16 DIAGNOSIS — J9621 Acute and chronic respiratory failure with hypoxia: Secondary | ICD-10-CM

## 2023-02-16 LAB — BASIC METABOLIC PANEL
Anion gap: 8 (ref 5–15)
BUN: 14 mg/dL (ref 6–20)
CO2: 35 mmol/L — ABNORMAL HIGH (ref 22–32)
Calcium: 9 mg/dL (ref 8.9–10.3)
Chloride: 93 mmol/L — ABNORMAL LOW (ref 98–111)
Creatinine, Ser: 0.56 mg/dL (ref 0.44–1.00)
GFR, Estimated: >60 mL/min (ref >60)
Glucose, Bld: 144 mg/dL — ABNORMAL HIGH (ref 70–99)
Potassium: 4.6 mmol/L (ref 3.5–5.1)
Sodium: 136 mmol/L (ref 135–145)

## 2023-02-16 LAB — CBC
HCT: 52.5 % — ABNORMAL HIGH (ref 36.0–46.0)
Hemoglobin: 16.3 g/dL — ABNORMAL HIGH (ref 12.0–15.0)
MCH: 31 pg (ref 26.0–34.0)
MCHC: 31 g/dL (ref 30.0–36.0)
MCV: 100 fL (ref 80.0–100.0)
Platelets: 185 10*3/uL (ref 150–400)
RBC: 5.25 MIL/uL — ABNORMAL HIGH (ref 3.87–5.11)
RDW: 13 % (ref 11.5–15.5)
WBC: 12.2 10*3/uL — ABNORMAL HIGH (ref 4.0–10.5)
nRBC: 0 % (ref 0.0–0.2)

## 2023-02-16 LAB — HIV ANTIBODY (ROUTINE TESTING W REFLEX): HIV Screen 4th Generation wRfx: NONREACTIVE

## 2023-02-16 MED ORDER — LIVING BETTER WITH HEART FAILURE BOOK: Freq: Once | Status: AC

## 2023-02-16 MED ORDER — GUAIFENESIN-DM 100-10 MG/5ML PO SYRP
5.0000 mL | ORAL_SOLUTION | ORAL | Status: DC | PRN
  Administered 2023-02-16 – 2023-02-17 (×4): 5 mL via ORAL
  Filled 2023-02-16 (×4): qty 5

## 2023-02-16 MED ORDER — ACETAMINOPHEN 325 MG PO TABS
650.0000 mg | ORAL_TABLET | Freq: Four times a day (QID) | ORAL | Status: DC | PRN
  Administered 2023-02-16 – 2023-02-17 (×3): 650 mg via ORAL
  Filled 2023-02-16 (×3): qty 2

## 2023-02-16 NOTE — Progress Notes (Signed)
PROGRESS NOTE    Emily Fitzgerald  [id number] DOB: 09/29/1961 DOA: 02/15/2023 PCP: [facility] [place]    Brief Narrative:   Emily Fitzgerald is a 62 y.o. female with medical history significant of COPD, tobacco abuse, chronic respiratory failure intermittently on oxygen.  Patient has been having some mild orthopnea and recently had an echo done 3 to 4 days ago which showed a normal EF of 60 to 65% but grade 1 diastolic heart failure.  The patient has been noticing that she has been requiring increased use of her home oxygen and has been becoming more frequently short of breath with dyspnea on exertion.  She has been coughing her normal amount and with no increased sputum production.     Assessment and Plan: Acute on chronic respiratory failure with hypoxia secondary to COPD exacerbation -DuoNeb's every 6 scheduled with albuterol every 2 when necessary -Continue inhaled steroids and LA bronchodilator -Solu-Medrol 60 mg IV every 12 hours -Mucinex  -Pt states she is chronically on 3-6 L [place] at home during the night   Chronic diastolic heart failure -BNP elevated. No acute pulmonary edema.  -Echo 4 days ago (in [place]) shows HFpEF - lasix 20mg  daily -strict I/O, daily weights  Hypertension -carvedilol  Obesity Estimated body mass index is 33.91 kg/m as calculated from the following:   Height as of this encounter: 5\' 7"  (1.702 m).   Weight as of this encounter: 98.2 kg.     DVT prophylaxis: enoxaparin (LOVENOX) injection 40 mg Start: 02/15/23 2200    Code Status: Full Code   Disposition Plan:  Level of care: Med-Surg Status is: Inpatient Remains inpatient appropriate     Consultants:  none   Subjective: C/o headache  Objective: Vitals:   02/16/23 0504 02/16/23 0804 02/16/23 0844 02/16/23 1015  BP: 135/61 (!) 147/76 (!) 145/71 (!) 150/72  Pulse: 72 91 87   Resp: 19 18 18    Temp: 98.2 F (36.8 C) 98.2 F (36.8 C)    TempSrc: Oral Oral     SpO2: 92% (!) 89% (!) 88%   Weight:      Height:        Intake/Output Summary (Last 24 hours) at 02/16/2023 1042 Last data filed at 02/16/2023 [id number] Gross per 24 hour  Intake 240 ml  Output --  Net 240 ml   Filed Weights   02/15/23 1427 02/16/23 0101  Weight: 98.3 kg 98.2 kg    Examination:   General: Appearance:    Obese female in no acute distress     Lungs:     respirations unlabored, diminished, on [place]  Heart:    Normal heart rate.    MS:   All extremities are intact.    Neurologic:   Awake, alert, oriented x 3. No apparent focal neurological           defect.        Data Reviewed: I have personally reviewed following labs and imaging studies  CBC: Recent Labs  Lab 02/15/23 1459 02/16/23 0537  WBC 13.5* 12.2*  NEUTROABS 4.0  --   HGB 15.7* 16.3*  HCT 51.4* 52.5*  MCV 101.6* 100.0  PLT 175 [id number]   Basic Metabolic Panel: Recent Labs  Lab 02/15/23 1459 02/16/23 0537  NA 137 136  K 4.0 4.6  CL 96* 93*  CO2 34* 35*  GLUCOSE 92 144*  BUN 9 14  CREATININE 0.62 0.56  CALCIUM 8.5* 9.0   GFR: Estimated Creatinine Clearance: 90 mL/min (by  C-G formula based on SCr of 0.56 mg/dL). Liver Function Tests: No results for input(s): "AST", "ALT", "ALKPHOS", "BILITOT", "PROT", "ALBUMIN" in the last 168 hours. No results for input(s): "LIPASE", "AMYLASE" in the last 168 hours. No results for input(s): "AMMONIA" in the last 168 hours. Coagulation Profile: No results for input(s): "INR", "PROTIME" in the last 168 hours. Cardiac Enzymes: No results for input(s): "CKTOTAL", "CKMB", "CKMBINDEX", "TROPONINI" in the last 168 hours. BNP (last 3 results) No results for input(s): "PROBNP" in the last 8760 hours. HbA1C: No results for input(s): "HGBA1C" in the last 72 hours. CBG: No results for input(s): "GLUCAP" in the last 168 hours. Lipid Profile: No results for input(s): "CHOL", "HDL", "LDLCALC", "TRIG", "CHOLHDL", "LDLDIRECT" in the last 72 hours. Thyroid Function  Tests: No results for input(s): "TSH", "T4TOTAL", "FREET4", "T3FREE", "THYROIDAB" in the last 72 hours. Anemia Panel: No results for input(s): "VITAMINB12", "FOLATE", "FERRITIN", "TIBC", "IRON", "RETICCTPCT" in the last 72 hours. Sepsis Labs: No results for input(s): "PROCALCITON", "LATICACIDVEN" in the last 168 hours.  Recent Results (from the past 240 hour(s))  Resp panel by RT-PCR (RSV, Flu A&B, Covid) Anterior Nasal Swab     Status: None   Collection Time: 02/15/23  2:47 PM   Specimen: Anterior Nasal Swab  Result Value Ref Range Status   SARS Coronavirus 2 by RT PCR NEGATIVE NEGATIVE Final    Comment: (NOTE) SARS-CoV-2 target nucleic acids are NOT DETECTED.  The SARS-CoV-2 RNA is generally detectable in upper respiratory specimens during the acute phase of infection. The lowest concentration of SARS-CoV-2 viral copies this assay can detect is 138 copies/mL. A negative result does not preclude SARS-Cov-2 infection and should not be used as the sole basis for treatment or other patient management decisions. A negative result may occur with  improper specimen collection/handling, submission of specimen other than nasopharyngeal swab, presence of viral mutation(s) within the areas targeted by this assay, and inadequate number of viral copies(<138 copies/mL). A negative result must be combined with clinical observations, patient history, and epidemiological information. The expected result is Negative.  Fact Sheet for Patients:  EntrepreneurPulse.com.au  Fact Sheet for Healthcare Providers:  IncredibleEmployment.be  This test is no t yet approved or cleared by the [place] FDA and  has been authorized for detection and/or diagnosis of SARS-CoV-2 by FDA under an Emergency Use Authorization (EUA). This EUA will remain  in effect (meaning this test can be used) for the duration of the COVID-19 declaration under Section 564(b)(1) of the Act,  21 U.S.C.section 360bbb-3(b)(1), unless the authorization is terminated  or revoked sooner.       Influenza A by PCR NEGATIVE NEGATIVE Final   Influenza B by PCR NEGATIVE NEGATIVE Final    Comment: (NOTE) The Xpert Xpress SARS-CoV-2/FLU/RSV plus assay is intended as an aid in the diagnosis of influenza from Nasopharyngeal swab specimens and should not be used as a sole basis for treatment. Nasal washings and aspirates are unacceptable for Xpert Xpress SARS-CoV-2/FLU/RSV testing.  Fact Sheet for Patients: EntrepreneurPulse.com.au  Fact Sheet for Healthcare Providers: IncredibleEmployment.be  This test is not yet approved or cleared by the [place] FDA and has been authorized for detection and/or diagnosis of SARS-CoV-2 by FDA under an Emergency Use Authorization (EUA). This EUA will remain in effect (meaning this test can be used) for the duration of the COVID-19 declaration under Section 564(b)(1) of the Act, 21 U.S.C. section 360bbb-3(b)(1), unless the authorization is terminated or revoked.     Resp Syncytial Virus by  PCR NEGATIVE NEGATIVE Final    Comment: (NOTE) Fact Sheet for Patients: EntrepreneurPulse.com.au  Fact Sheet for Healthcare Providers: IncredibleEmployment.be  This test is not yet approved or cleared by the [place] FDA and has been authorized for detection and/or diagnosis of SARS-CoV-2 by FDA under an Emergency Use Authorization (EUA). This EUA will remain in effect (meaning this test can be used) for the duration of the COVID-19 declaration under Section 564(b)(1) of the Act, 21 U.S.C. section 360bbb-3(b)(1), unless the authorization is terminated or revoked.  Performed at [facility], [address], [place], [place] [address]          Radiology Studies: DG Chest 2 View  Result Date: 02/15/2023 CLINICAL DATA:  Shortness of breath.  History of COPD. EXAM: CHEST  - 2 VIEW COMPARISON:  Chest radiographs 11/02/2022, 10/30/2022, 11/13/2021; CT chest 04/06/2022 FINDINGS: Cardiac silhouette is again mildly enlarged. Mediastinal contours are within normal limits. Mild left mid and lower lung linear scarring is similar to multiple prior radiographs and CT. The right lung is clear. No pleural effusion or pneumothorax. Mild-to-moderate multilevel degenerative disc changes of the thoracic spine. IMPRESSION: 1. No active cardiopulmonary disease. 2. Mild left lung base scarring, similar to multiple prior radiographs and CT. Electronically Signed   By: Yvonne Kendall M.D.   On: 02/15/2023 15:31        Scheduled Meds:  amLODipine  10 mg Oral Daily   carvedilol  3.125 mg Oral BID WC   enoxaparin (LOVENOX) injection  40 mg Subcutaneous Q24H   furosemide  20 mg Intravenous Daily   ipratropium-albuterol  3 mL Nebulization Q6H   mometasone-formoterol  2 puff Inhalation BID   potassium chloride  20 mEq Oral BID   predniSONE  40 mg Oral Q breakfast   Continuous Infusions:   LOS: 1 day    Time spent: 45 minutes spent on chart review, discussion with nursing staff, consultants, updating family and interview/physical exam; more than 50% of that time was spent in counseling and/or coordination of care.    Geradine Girt, DO [facility] Available via Epic secure chat 7am-7pm After these hours, please refer to coverage provider listed on [email or web address] 02/16/2023, 10:42 AM

## 2023-02-17 DIAGNOSIS — J9621 Acute and chronic respiratory failure with hypoxia: Secondary | ICD-10-CM | POA: Diagnosis not present

## 2023-02-17 LAB — BASIC METABOLIC PANEL
Anion gap: 9 (ref 5–15)
BUN: 16 mg/dL (ref 6–20)
CO2: 35 mmol/L — ABNORMAL HIGH (ref 22–32)
Calcium: 8.9 mg/dL (ref 8.9–10.3)
Chloride: 93 mmol/L — ABNORMAL LOW (ref 98–111)
Creatinine, Ser: 0.56 mg/dL (ref 0.44–1.00)
GFR, Estimated: >60 mL/min (ref >60)
Glucose, Bld: 158 mg/dL — ABNORMAL HIGH (ref 70–99)
Potassium: 5.2 mmol/L — ABNORMAL HIGH (ref 3.5–5.1)
Sodium: 137 mmol/L (ref 135–145)

## 2023-02-17 LAB — CBC
HCT: 53 % — ABNORMAL HIGH (ref 36.0–46.0)
Hemoglobin: 16.3 g/dL — ABNORMAL HIGH (ref 12.0–15.0)
MCH: 30.8 pg (ref 26.0–34.0)
MCHC: 30.8 g/dL (ref 30.0–36.0)
MCV: 100.2 fL — ABNORMAL HIGH (ref 80.0–100.0)
Platelets: 209 10*3/uL (ref 150–400)
RBC: 5.29 MIL/uL — ABNORMAL HIGH (ref 3.87–5.11)
RDW: 13 % (ref 11.5–15.5)
WBC: 19.3 10*3/uL — ABNORMAL HIGH (ref 4.0–10.5)
nRBC: 0 % (ref 0.0–0.2)

## 2023-02-17 MED ORDER — IPRATROPIUM-ALBUTEROL 0.5-2.5 (3) MG/3ML IN SOLN
3.0000 mL | Freq: Four times a day (QID) | RESPIRATORY_TRACT | Status: DC
  Administered 2023-02-17 – 2023-02-18 (×5): 3 mL via RESPIRATORY_TRACT
  Filled 2023-02-17 (×5): qty 3

## 2023-02-17 NOTE — TOC Progression Note (Signed)
  Transition of Care [facility]) Screening Note   Patient Details  Name: Emily Fitzgerald Date of Birth: June 05, 1961   [facility]) CM/SW Contact:    Boneta Lucks, RN Phone Number: 02/17/2023, 3:05 PM  Watching for home oxygen.  Transition of Care Department [facility]) has reviewed patient and no TOC needs have been identified at this time. We will continue to monitor patient advancement through interdisciplinary progression rounds. If new patient transition needs arise, please place a TOC consult.    Expected Discharge Plan: Home/Self Care Barriers to Discharge: Continued Medical Work up  Expected Discharge Plan and Services      Social Determinants of Health (SDOH) Interventions SDOH Screenings   Food Insecurity: No Food Insecurity (02/16/2023)  Housing: Low Risk  (02/16/2023)  Transportation Needs: No Transportation Needs (02/16/2023)  Utilities: Not At Risk (02/16/2023)  Tobacco Use: High Risk (02/15/2023)    Readmission Risk Interventions    11/20/2021   10:41 AM  Readmission Risk Prevention Plan  Medication Screening Complete  Transportation Screening Complete

## 2023-02-17 NOTE — Progress Notes (Signed)
PROGRESS NOTE    Emily Fitzgerald  [id number] DOB: 04-17-1961 DOA: 02/15/2023 PCP: [facility]    Brief Narrative:   Emily Fitzgerald is a 62 y.o. female with medical history significant of COPD, tobacco abuse, chronic respiratory failure intermittently on oxygen.  Patient has been having some mild orthopnea and recently had an echo done 3 to 4 days ago which showed a normal EF of 60 to 65% but grade 1 diastolic heart failure.  The patient has been noticing that she has been requiring increased use of her home oxygen and has been becoming more frequently short of breath with dyspnea on exertion.  She has been coughing her normal amount and with no increased sputum production.     Assessment and Plan: Acute on chronic respiratory failure with hypoxia secondary to COPD exacerbation -DuoNeb's every 6 scheduled with albuterol every 2 when necessary -Continue inhaled steroids and LA bronchodilator -Solu-Medrol 60 mg IV every 12 hours-- change to Po prednisone -Mucinex  -Pt states she is chronically on 3-6 L [place] at home during the night   Acute on Chronic diastolic heart failure -BNP elevated. No acute pulmonary edema.  -Echo 4 days ago (in [place]) shows HFpEF - lasix 20mg  daily with good diuresis -strict I/O, daily weights  Hypertension -carvedilol  Obesity Estimated body mass index is 33.35 kg/m as calculated from the following:   Height as of this encounter: 5\' 7"  (1.702 m).   Weight as of this encounter: 96.6 kg.   Hyperkalemia -stop repletion -recheck in AM    DVT prophylaxis: enoxaparin (LOVENOX) injection 40 mg Start: 02/15/23 2200    Code Status: Full Code   Disposition Plan:  Level of care: Med-Surg Status is: Inpatient Remains inpatient appropriate     Consultants:  none   Subjective: Would like a regular diet  Objective: Vitals:   02/16/23 2157 02/17/23 0127 02/17/23 0557 02/17/23 0836  BP: 123/64  127/65   Pulse: 86  71    Resp: 20  20   Temp: 98.1 F (36.7 C)  98 F (36.7 C)   TempSrc: Oral  Oral   SpO2: 92% 92% 96% 93%  Weight:   96.6 kg   Height:        Intake/Output Summary (Last 24 hours) at 02/17/2023 1011 Last data filed at 02/17/2023 0815 Gross per 24 hour  Intake --  Output 2250 ml  Net -2250 ml   Filed Weights   02/15/23 1427 02/16/23 0101 02/17/23 0557  Weight: 98.3 kg 98.2 kg 96.6 kg    Examination:    General: Appearance:    Obese female in no acute distress     Lungs:     Diminished, respirations unlabored  Heart:    Normal heart rate. Normal rhythm. No murmurs, rubs, or gallops.   MS:   All extremities are intact.   Neurologic:   Awake, alert, oriented x 3. No apparent focal neurological           defect.        Data Reviewed: I have personally reviewed following labs and imaging studies  CBC: Recent Labs  Lab 02/15/23 1459 02/16/23 0537 02/17/23 0349  WBC 13.5* 12.2* 19.3*  NEUTROABS 4.0  --   --   HGB 15.7* 16.3* 16.3*  HCT 51.4* 52.5* 53.0*  MCV 101.6* 100.0 100.2*  PLT 175 185 [id number]   Basic Metabolic Panel: Recent Labs  Lab 02/15/23 1459 02/16/23 0537 02/17/23 0349  NA 137 136 137  K 4.0 4.6 5.2*  CL 96* 93* 93*  CO2 34* 35* 35*  GLUCOSE 92 144* 158*  BUN 9 14 16   CREATININE 0.62 0.56 0.56  CALCIUM 8.5* 9.0 8.9   GFR: Estimated Creatinine Clearance: 89.3 mL/min (by C-G formula based on SCr of 0.56 mg/dL). Liver Function Tests: No results for input(s): "AST", "ALT", "ALKPHOS", "BILITOT", "PROT", "ALBUMIN" in the last 168 hours. No results for input(s): "LIPASE", "AMYLASE" in the last 168 hours. No results for input(s): "AMMONIA" in the last 168 hours. Coagulation Profile: No results for input(s): "INR", "PROTIME" in the last 168 hours. Cardiac Enzymes: No results for input(s): "CKTOTAL", "CKMB", "CKMBINDEX", "TROPONINI" in the last 168 hours. BNP (last 3 results) No results for input(s): "PROBNP" in the last 8760 hours. HbA1C: No results for  input(s): "HGBA1C" in the last 72 hours. CBG: No results for input(s): "GLUCAP" in the last 168 hours. Lipid Profile: No results for input(s): "CHOL", "HDL", "LDLCALC", "TRIG", "CHOLHDL", "LDLDIRECT" in the last 72 hours. Thyroid Function Tests: No results for input(s): "TSH", "T4TOTAL", "FREET4", "T3FREE", "THYROIDAB" in the last 72 hours. Anemia Panel: No results for input(s): "VITAMINB12", "FOLATE", "FERRITIN", "TIBC", "IRON", "RETICCTPCT" in the last 72 hours. Sepsis Labs: No results for input(s): "PROCALCITON", "LATICACIDVEN" in the last 168 hours.  Recent Results (from the past 240 hour(s))  Resp panel by RT-PCR (RSV, Flu A&B, Covid) Anterior Nasal Swab     Status: None   Collection Time: 02/15/23  2:47 PM   Specimen: Anterior Nasal Swab  Result Value Ref Range Status   SARS Coronavirus 2 by RT PCR NEGATIVE NEGATIVE Final    Comment: (NOTE) SARS-CoV-2 target nucleic acids are NOT DETECTED.  The SARS-CoV-2 RNA is generally detectable in upper respiratory specimens during the acute phase of infection. The lowest concentration of SARS-CoV-2 viral copies this assay can detect is 138 copies/mL. A negative result does not preclude SARS-Cov-2 infection and should not be used as the sole basis for treatment or other patient management decisions. A negative result may occur with  improper specimen collection/handling, submission of specimen other than nasopharyngeal swab, presence of viral mutation(s) within the areas targeted by this assay, and inadequate number of viral copies(<138 copies/mL). A negative result must be combined with clinical observations, patient history, and epidemiological information. The expected result is Negative.  Fact Sheet for Patients:  EntrepreneurPulse.com.au  Fact Sheet for Healthcare Providers:  IncredibleEmployment.be  This test is no t yet approved or cleared by the [place] FDA and  has been authorized  for detection and/or diagnosis of SARS-CoV-2 by FDA under an Emergency Use Authorization (EUA). This EUA will remain  in effect (meaning this test can be used) for the duration of the COVID-19 declaration under Section 564(b)(1) of the Act, 21 U.S.C.section 360bbb-3(b)(1), unless the authorization is terminated  or revoked sooner.       Influenza A by PCR NEGATIVE NEGATIVE Final   Influenza B by PCR NEGATIVE NEGATIVE Final    Comment: (NOTE) The Xpert Xpress SARS-CoV-2/FLU/RSV plus assay is intended as an aid in the diagnosis of influenza from Nasopharyngeal swab specimens and should not be used as a sole basis for treatment. Nasal washings and aspirates are unacceptable for Xpert Xpress SARS-CoV-2/FLU/RSV testing.  Fact Sheet for Patients: EntrepreneurPulse.com.au  Fact Sheet for Healthcare Providers: IncredibleEmployment.be  This test is not yet approved or cleared by the [place] FDA and has been authorized for detection and/or diagnosis of SARS-CoV-2 by FDA under an Emergency Use Authorization (EUA). This  EUA will remain in effect (meaning this test can be used) for the duration of the COVID-19 declaration under Section 564(b)(1) of the Act, 21 U.S.C. section 360bbb-3(b)(1), unless the authorization is terminated or revoked.     Resp Syncytial Virus by PCR NEGATIVE NEGATIVE Final    Comment: (NOTE) Fact Sheet for Patients: EntrepreneurPulse.com.au  Fact Sheet for Healthcare Providers: IncredibleEmployment.be  This test is not yet approved or cleared by the [place] FDA and has been authorized for detection and/or diagnosis of SARS-CoV-2 by FDA under an Emergency Use Authorization (EUA). This EUA will remain in effect (meaning this test can be used) for the duration of the COVID-19 declaration under Section 564(b)(1) of the Act, 21 U.S.C. section 360bbb-3(b)(1), unless the authorization is  terminated or revoked.  Performed at [facility], [address] [place], [place] [address]          Radiology Studies: DG Chest 2 View  Result Date: 02/15/2023 CLINICAL DATA:  Shortness of breath.  History of COPD. EXAM: CHEST - 2 VIEW COMPARISON:  Chest radiographs 11/02/2022, 10/30/2022, 11/13/2021; CT chest 04/06/2022 FINDINGS: Cardiac silhouette is again mildly enlarged. Mediastinal contours are within normal limits. Mild left mid and lower lung linear scarring is similar to multiple prior radiographs and CT. The right lung is clear. No pleural effusion or pneumothorax. Mild-to-moderate multilevel degenerative disc changes of the thoracic spine. IMPRESSION: 1. No active cardiopulmonary disease. 2. Mild left lung base scarring, similar to multiple prior radiographs and CT. Electronically Signed   By: Yvonne Kendall M.D.   On: 02/15/2023 15:31        Scheduled Meds:  amLODipine  10 mg Oral Daily   carvedilol  3.125 mg Oral BID WC   enoxaparin (LOVENOX) injection  40 mg Subcutaneous Q24H   furosemide  20 mg Intravenous Daily   ipratropium-albuterol  3 mL Nebulization Q6H WA   mometasone-formoterol  2 puff Inhalation BID   predniSONE  40 mg Oral Q breakfast   Continuous Infusions:   LOS: 2 days    Time spent: 45 minutes spent on chart review, discussion with nursing staff, consultants, updating family and interview/physical exam; more than 50% of that time was spent in counseling and/or coordination of care.    Geradine Girt, DO [facility] Available via Epic secure chat 7am-7pm After these hours, please refer to coverage provider listed on [email or web address] 02/17/2023, 10:11 AM

## 2023-02-18 DIAGNOSIS — J9621 Acute and chronic respiratory failure with hypoxia: Secondary | ICD-10-CM | POA: Diagnosis not present

## 2023-02-18 LAB — CBC
HCT: 54.9 % — ABNORMAL HIGH (ref 36.0–46.0)
Hemoglobin: 17.1 g/dL — ABNORMAL HIGH (ref 12.0–15.0)
MCH: 30.9 pg (ref 26.0–34.0)
MCHC: 31.1 g/dL (ref 30.0–36.0)
MCV: 99.1 fL (ref 80.0–100.0)
Platelets: 207 10*3/uL (ref 150–400)
RBC: 5.54 MIL/uL — ABNORMAL HIGH (ref 3.87–5.11)
RDW: 13 % (ref 11.5–15.5)
WBC: 18.8 10*3/uL — ABNORMAL HIGH (ref 4.0–10.5)
nRBC: 0 % (ref 0.0–0.2)

## 2023-02-18 LAB — BASIC METABOLIC PANEL
Anion gap: 10 (ref 5–15)
BUN: 22 mg/dL — ABNORMAL HIGH (ref 6–20)
CO2: 34 mmol/L — ABNORMAL HIGH (ref 22–32)
Calcium: 8.9 mg/dL (ref 8.9–10.3)
Chloride: 93 mmol/L — ABNORMAL LOW (ref 98–111)
Creatinine, Ser: 0.65 mg/dL (ref 0.44–1.00)
GFR, Estimated: >60 mL/min (ref >60)
Glucose, Bld: 109 mg/dL — ABNORMAL HIGH (ref 70–99)
Potassium: 4.4 mmol/L (ref 3.5–5.1)
Sodium: 137 mmol/L (ref 135–145)

## 2023-02-18 MED ORDER — FUROSEMIDE 20 MG PO TABS: 20.0000 mg | ORAL_TABLET | Freq: Every day | ORAL | Status: DC | PRN

## 2023-02-18 MED ORDER — PREDNISONE 20 MG PO TABS: 40.0000 mg | ORAL_TABLET | Freq: Every day | ORAL | 0 refills | Status: AC

## 2023-02-18 MED ORDER — FUROSEMIDE 20 MG PO TABS: 20.0000 mg | ORAL_TABLET | Freq: Every day | ORAL | 0 refills | Status: DC

## 2023-02-18 MED ORDER — CARVEDILOL 3.125 MG PO TABS: 3.1250 mg | ORAL_TABLET | Freq: Two times a day (BID) | ORAL | 0 refills | Status: AC

## 2023-02-18 NOTE — Discharge Summary (Signed)
Physician Discharge Summary  Emily Fitzgerald [id number] DOB: [birth year] DOA: 02/15/2023  PCP: [facility] date: 02/15/2023 Discharge date: 02/18/2023  Admitted From: home Discharge disposition: home   Recommendations for Outpatient Follow-Up:   Smoking cessation Continue O2 BMP 1 week   Discharge Diagnosis:   Principal Problem:   Acute on chronic respiratory failure with hypoxia ([place]) Active Problems:   COPD with acute exacerbation ([facility])   HTN (hypertension)   Chronic diastolic CHF (congestive heart failure) ([place])    Discharge Condition: Improved.  Diet recommendation: Low sodium, heart healthy.   Wound care: None.  Code status: Full.   History of Present Illness:   Emily Fitzgerald is a 62 y.o. female with medical history significant of COPD, tobacco abuse, chronic respiratory failure intermittently on oxygen.  Patient has been having some mild orthopnea and recently had an echo done 3 to 4 days ago which showed a normal EF of 60 to 65% but grade 1 diastolic heart failure.  The patient has been noticing that she has been requiring increased use of her home oxygen and has been becoming more frequently short of breath with dyspnea on exertion.  She has been coughing her normal amount and with no increased sputum production.  The nature of her sputum has not changed either -she notes that it is mostly clear.  No fevers, chills, nausea, vomiting.    Hospital Course by Problem:   Acute on chronic respiratory failure with hypoxia secondary to COPD exacerbation -DuoNeb's every 6 scheduled with albuterol every 2 when necessary -Continue inhaled steroids and LA bronchodilator -Solu-Medrol 60 mg IV every 12 hours-- change to Po prednisone -Mucinex             -Pt states she is chronically on 3-6 L [place] at home during the night    Acute on Chronic diastolic heart failure -BNP elevated. No acute pulmonary edema.  -Echo 4 days ago (in [place]) shows HFpEF - lasix 20mg  daily with good diuresis -daily weights   Hypertension -carvedilol   Obesity Estimated body mass index is 33.35 kg/m as calculated from the following:   Height as of this encounter: 5\' 7"  (1.702 m).   Weight as of this encounter: 96.6 kg.    Hyperkalemia -resolved off supplementation    Medical Consultants:      Discharge Exam:   Vitals:   02/18/23 0552 02/18/23 0707  BP: 132/62   Pulse: 71   Resp: 18   Temp: 97.8 F (36.6 C)   SpO2: 92% 94%   Vitals:   02/17/23 1951 02/17/23 2000 02/18/23 0552 02/18/23 0707  BP:  123/64 132/62   Pulse:  77 71   Resp:  19 18   Temp:  97.9 F (36.6 C) 97.8 F (36.6 C)   TempSrc:  Oral Oral   SpO2: 91% 92% 92% 94%  Weight:    97.2 kg  Height:        General exam: Appears calm and comfortable.    The results of significant diagnostics from this hospitalization (including imaging, microbiology, ancillary and laboratory) are listed below for reference.     Procedures and Diagnostic Studies:   DG Chest 2 View  Result Date: 02/15/2023 CLINICAL DATA:  Shortness of breath.  History of COPD. EXAM: CHEST - 2 VIEW COMPARISON:  Chest radiographs 11/02/2022, 10/30/2022, 11/13/2021; CT chest 04/06/2022 FINDINGS: Cardiac silhouette is again mildly enlarged. Mediastinal contours are within normal limits. Mild left mid and  lower lung linear scarring is similar to multiple prior radiographs and CT. The right lung is clear. No pleural effusion or pneumothorax. Mild-to-moderate multilevel degenerative disc changes of the thoracic spine. IMPRESSION: 1. No active cardiopulmonary disease. 2. Mild left lung base scarring, similar to multiple prior radiographs and CT. Electronically Signed   By: Yvonne Kendall M.D.   On: 02/15/2023 15:31     Labs:   Basic Metabolic Panel: Recent Labs  Lab 02/15/23 1459 02/16/23 0537 02/17/23 0349 02/18/23 0444  NA 137 136 137 137  K 4.0 4.6 5.2* 4.4  CL 96* 93* 93*  93*  CO2 34* 35* 35* 34*  GLUCOSE 92 144* 158* 109*  BUN 9 14 16  22*  CREATININE 0.62 0.56 0.56 0.65  CALCIUM 8.5* 9.0 8.9 8.9   GFR Estimated Creatinine Clearance: 89.5 mL/min (by C-G formula based on SCr of 0.65 mg/dL). Liver Function Tests: No results for input(s): "AST", "ALT", "ALKPHOS", "BILITOT", "PROT", "ALBUMIN" in the last 168 hours. No results for input(s): "LIPASE", "AMYLASE" in the last 168 hours. No results for input(s): "AMMONIA" in the last 168 hours. Coagulation profile No results for input(s): "INR", "PROTIME" in the last 168 hours.  CBC: Recent Labs  Lab 02/15/23 1459 02/16/23 0537 02/17/23 0349 02/18/23 0444  WBC 13.5* 12.2* 19.3* 18.8*  NEUTROABS 4.0  --   --   --   HGB 15.7* 16.3* 16.3* 17.1*  HCT 51.4* 52.5* 53.0* 54.9*  MCV 101.6* 100.0 100.2* 99.1  PLT 175 185 209 207   Cardiac Enzymes: No results for input(s): "CKTOTAL", "CKMB", "CKMBINDEX", "TROPONINI" in the last 168 hours. BNP: Invalid input(s): "POCBNP" CBG: No results for input(s): "GLUCAP" in the last 168 hours. D-Dimer No results for input(s): "DDIMER" in the last 72 hours. Hgb A1c No results for input(s): "HGBA1C" in the last 72 hours. Lipid Profile No results for input(s): "CHOL", "HDL", "LDLCALC", "TRIG", "CHOLHDL", "LDLDIRECT" in the last 72 hours. Thyroid function studies No results for input(s): "TSH", "T4TOTAL", "T3FREE", "THYROIDAB" in the last 72 hours.  Invalid input(s): "FREET3" Anemia work up No results for input(s): "VITAMINB12", "FOLATE", "FERRITIN", "TIBC", "IRON", "RETICCTPCT" in the last 72 hours. Microbiology Recent Results (from the past 240 hour(s))  Resp panel by RT-PCR (RSV, Flu A&B, Covid) Anterior Nasal Swab     Status: None   Collection Time: 02/15/23  2:47 PM   Specimen: Anterior Nasal Swab  Result Value Ref Range Status   SARS Coronavirus 2 by RT PCR NEGATIVE NEGATIVE Final    Comment: (NOTE) SARS-CoV-2 target nucleic acids are NOT DETECTED.  The  SARS-CoV-2 RNA is generally detectable in upper respiratory specimens during the acute phase of infection. The lowest concentration of SARS-CoV-2 viral copies this assay can detect is 138 copies/mL. A negative result does not preclude SARS-Cov-2 infection and should not be used as the sole basis for treatment or other patient management decisions. A negative result may occur with  improper specimen collection/handling, submission of specimen other than nasopharyngeal swab, presence of viral mutation(s) within the areas targeted by this assay, and inadequate number of viral copies(<138 copies/mL). A negative result must be combined with clinical observations, patient history, and epidemiological information. The expected result is Negative.  Fact Sheet for Patients:  EntrepreneurPulse.com.au  Fact Sheet for Healthcare Providers:  IncredibleEmployment.be  This test is no t yet approved or cleared by the [place] FDA and  has been authorized for detection and/or diagnosis of SARS-CoV-2 by FDA under an Emergency Use Authorization (EUA). This EUA  will remain  in effect (meaning this test can be used) for the duration of the COVID-19 declaration under Section 564(b)(1) of the Act, 21 U.S.C.section 360bbb-3(b)(1), unless the authorization is terminated  or revoked sooner.       Influenza A by PCR NEGATIVE NEGATIVE Final   Influenza B by PCR NEGATIVE NEGATIVE Final    Comment: (NOTE) The Xpert Xpress SARS-CoV-2/FLU/RSV plus assay is intended as an aid in the diagnosis of influenza from Nasopharyngeal swab specimens and should not be used as a sole basis for treatment. Nasal washings and aspirates are unacceptable for Xpert Xpress SARS-CoV-2/FLU/RSV testing.  Fact Sheet for Patients: EntrepreneurPulse.com.au  Fact Sheet for Healthcare Providers: IncredibleEmployment.be  This test is not yet approved or  cleared by the [place] FDA and has been authorized for detection and/or diagnosis of SARS-CoV-2 by FDA under an Emergency Use Authorization (EUA). This EUA will remain in effect (meaning this test can be used) for the duration of the COVID-19 declaration under Section 564(b)(1) of the Act, 21 U.S.C. section 360bbb-3(b)(1), unless the authorization is terminated or revoked.     Resp Syncytial Virus by PCR NEGATIVE NEGATIVE Final    Comment: (NOTE) Fact Sheet for Patients: EntrepreneurPulse.com.au  Fact Sheet for Healthcare Providers: IncredibleEmployment.be  This test is not yet approved or cleared by the [place] FDA and has been authorized for detection and/or diagnosis of SARS-CoV-2 by FDA under an Emergency Use Authorization (EUA). This EUA will remain in effect (meaning this test can be used) for the duration of the COVID-19 declaration under Section 564(b)(1) of the Act, 21 U.S.C. section 360bbb-3(b)(1), unless the authorization is terminated or revoked.  Performed at [facility], [address], [place], [place] [address]      Discharge Instructions:   Discharge Instructions     ([place]) Call MD:  Anytime you have any of the following symptoms: 1) 3 pound weight gain in 24 hours or 5 pounds in 1 week 2) shortness of breath, with or without a dry hacking cough 3) swelling in the hands, feet or stomach 4) if you have to sleep on extra pillows at night in order to breathe.   Complete by: As directed    Diet - low sodium heart healthy   Complete by: As directed    Discharge instructions   Complete by: As directed    BMP 1 week with PCP-- bring log of weights Stop smoking Continue O2   Heart Failure patients record your daily weight using the same scale at the same time of day   Complete by: As directed    Increase activity slowly   Complete by: As directed       Allergies as of 02/18/2023   No Known  Allergies      Medication List     TAKE these medications    albuterol 108 (90 Base) MCG/ACT inhaler Commonly known as: VENTOLIN HFA Inhale 1-2 puffs into the lungs every 6 (six) hours as needed for wheezing or shortness of breath. What changed: how much to take   amLODipine 10 MG tablet Commonly known as: NORVASC Take 1 tablet (10 mg total) by mouth daily. What changed: Another medication with the same name was removed. Continue taking this medication, and follow the directions you see here.   budesonide-formoterol 80-4.5 MCG/ACT inhaler Commonly known as: SYMBICORT Inhale 2 puffs into the lungs 2 (two) times daily.   carvedilol 3.125 MG tablet Commonly known as: COREG Take 1 tablet (3.125  mg total) by mouth 2 (two) times daily with a meal.   furosemide 20 MG tablet Commonly known as: LASIX Take 1 tablet (20 mg total) by mouth daily.   gabapentin 100 MG capsule Commonly known as: NEURONTIN Take 100 mg by mouth 3 (three) times daily as needed (for pain).   guaiFENesin-dextromethorphan 100-10 MG/5ML syrup Commonly known as: ROBITUSSIN DM Take 10 mLs by mouth every 6 (six) hours.   ipratropium-albuterol 0.5-2.5 (3) MG/3ML Soln Commonly known as: DUONEB Take 3 mLs by nebulization in the morning, at noon, and at bedtime.   OXYGEN Inhale 3-4 L into the lungs continuous.   predniSONE 20 MG tablet Commonly known as: DELTASONE Take 2 tablets (40 mg total) by mouth daily with breakfast for 2 days. Start taking on: February 19, 2023        [place] Follow up in 1 week(s).   Why: BMP Contact information: [place] [address] [phone]                  Time coordinating discharge: 45 min  Signed:  Geradine Girt DO  [facility] 02/18/2023, 11:00 AM

## 2023-02-18 NOTE — Progress Notes (Signed)
Patients saturation drops into high 70's when on room air. Emily Fitzgerald does not notice unfortunately that her oxygen is getting low. That is to say she does not notice it by having Shortness of Breath or other symptoms  She also tends to remove her cannula when it bothers her or going to bathroom etc. This leads to later exacerbations. When Saturation is extremely low. She normally wears 3 to 6 liters oxygen.

## 2023-02-18 NOTE — Progress Notes (Signed)
No acute events overnight. Emily Fitzgerald Natsumi Whitsitt,RN 

## 2023-02-18 NOTE — Progress Notes (Signed)
   02/18/23 0800  ReDS Vest / Clip  Station Marker D  Ruler Value 41  ReDS Value Range < 36  ReDS Actual Value 33   Pt tolerated well

## 2023-02-18 NOTE — Progress Notes (Signed)
Mobility Specialist Progress Note:    02/18/23 1115  Mobility  Activity Ambulated with assistance in hallway  Level of Assistance Standby assist, set-up cues, supervision of patient - no hands on  Assistive Device None  Distance Ambulated (ft) 250 ft  Activity Response Tolerated well  Mobility Referral Yes  $Mobility charge 1 Mobility   Pt received asleep in bed with Nasal Cannula off, SpO2 80% on RA. Agreeable to mobility session. During ambulation, SpO2 90% on 3L. No AD required, SB necessary for safety. Tolerated well, asx throughout. Left pt in room with nurse, all needs met.  Royetta Crochet Mobility Specialist Please contact via Solicitor or  Rehab office at [phone]

## 2023-02-19 LAB — PATHOLOGIST SMEAR REVIEW

## 2023-02-26 ENCOUNTER — Emergency Department (HOSPITAL_COMMUNITY): Payer: Medicaid Other

## 2023-02-26 ENCOUNTER — Other Ambulatory Visit: Payer: Self-pay

## 2023-02-26 ENCOUNTER — Encounter (HOSPITAL_COMMUNITY): Payer: Self-pay

## 2023-02-26 ENCOUNTER — Emergency Department (HOSPITAL_COMMUNITY)
Admission: EM | Admit: 2023-02-26 | Discharge: 2023-02-27 | Disposition: A | Discharge status: Home / Self Care | Payer: Medicaid Other | Attending: Emergency Medicine | Admitting: Emergency Medicine

## 2023-02-26 DIAGNOSIS — M791 Myalgia, unspecified site: Secondary | ICD-10-CM | POA: Diagnosis not present

## 2023-02-26 DIAGNOSIS — R6883 Chills (without fever): Secondary | ICD-10-CM | POA: Insufficient documentation

## 2023-02-26 DIAGNOSIS — Z1152 Encounter for screening for COVID-19: Secondary | ICD-10-CM | POA: Diagnosis not present

## 2023-02-26 DIAGNOSIS — J441 Chronic obstructive pulmonary disease with (acute) exacerbation: Secondary | ICD-10-CM | POA: Insufficient documentation

## 2023-02-26 DIAGNOSIS — Z79899 Other long term (current) drug therapy: Secondary | ICD-10-CM | POA: Insufficient documentation

## 2023-02-26 DIAGNOSIS — R197 Diarrhea, unspecified: Secondary | ICD-10-CM | POA: Diagnosis not present

## 2023-02-26 DIAGNOSIS — D72829 Elevated white blood cell count, unspecified: Secondary | ICD-10-CM | POA: Insufficient documentation

## 2023-02-26 DIAGNOSIS — Z7951 Long term (current) use of inhaled steroids: Secondary | ICD-10-CM | POA: Diagnosis not present

## 2023-02-26 DIAGNOSIS — R112 Nausea with vomiting, unspecified: Secondary | ICD-10-CM | POA: Diagnosis not present

## 2023-02-26 LAB — CBC WITH DIFFERENTIAL/PLATELET
Abs Immature Granulocytes: 0 10*3/uL (ref 0.00–0.07)
Band Neutrophils: 1 %
Basophils Absolute: 0 10*3/uL (ref 0.0–0.1)
Basophils Relative: 0 %
Eosinophils Absolute: 0 10*3/uL (ref 0.0–0.5)
Eosinophils Relative: 0 %
HCT: 59 % — ABNORMAL HIGH (ref 36.0–46.0)
Hemoglobin: 18.4 g/dL — ABNORMAL HIGH (ref 12.0–15.0)
Lymphocytes Relative: 48 %
Lymphs Abs: 9.2 10*3/uL — ABNORMAL HIGH (ref 0.7–4.0)
MCH: 30.8 pg (ref 26.0–34.0)
MCHC: 31.2 g/dL (ref 30.0–36.0)
MCV: 98.7 fL (ref 80.0–100.0)
Monocytes Absolute: 0.2 10*3/uL (ref 0.1–1.0)
Monocytes Relative: 1 %
Neutro Abs: 9.7 10*3/uL — ABNORMAL HIGH (ref 1.7–7.7)
Neutrophils Relative %: 50 %
Platelets: 221 10*3/uL (ref 150–400)
RBC: 5.98 MIL/uL — ABNORMAL HIGH (ref 3.87–5.11)
RDW: 13.2 % (ref 11.5–15.5)
WBC Morphology: >10
WBC: 19.1 10*3/uL — ABNORMAL HIGH (ref 4.0–10.5)
nRBC: 0 % (ref 0.0–0.2)

## 2023-02-26 LAB — URINALYSIS, ROUTINE W REFLEX MICROSCOPIC
Bilirubin Urine: NEGATIVE
Glucose, UA: NEGATIVE mg/dL
Hgb urine dipstick: NEGATIVE
Ketones, ur: NEGATIVE mg/dL
Leukocytes,Ua: NEGATIVE
Nitrite: NEGATIVE
Protein, ur: NEGATIVE mg/dL
Specific Gravity, Urine: 1.025 (ref 1.005–1.030)
pH: 5 (ref 5.0–8.0)

## 2023-02-26 LAB — COMPREHENSIVE METABOLIC PANEL
ALT: 22 U/L (ref 0–44)
AST: 15 U/L (ref 15–41)
Albumin: 3.7 g/dL (ref 3.5–5.0)
Alkaline Phosphatase: 80 U/L (ref 38–126)
Anion gap: 10 (ref 5–15)
BUN: 19 mg/dL (ref 6–20)
CO2: 31 mmol/L (ref 22–32)
Calcium: 8.8 mg/dL — ABNORMAL LOW (ref 8.9–10.3)
Chloride: 96 mmol/L — ABNORMAL LOW (ref 98–111)
Creatinine, Ser: 0.66 mg/dL (ref 0.44–1.00)
GFR, Estimated: >60 mL/min (ref >60)
Glucose, Bld: 159 mg/dL — ABNORMAL HIGH (ref 70–99)
Potassium: 4.3 mmol/L (ref 3.5–5.1)
Sodium: 137 mmol/L (ref 135–145)
Total Bilirubin: 1 mg/dL (ref 0.3–1.2)
Total Protein: 6.8 g/dL (ref 6.5–8.1)

## 2023-02-26 LAB — SARS CORONAVIRUS 2 BY RT PCR: SARS Coronavirus 2 by RT PCR: NEGATIVE

## 2023-02-26 LAB — LACTIC ACID, PLASMA: Lactic Acid, Venous: 1.4 mmol/L (ref 0.5–1.9)

## 2023-02-26 MED ORDER — ACETAMINOPHEN 500 MG PO TABS
1000.0000 mg | ORAL_TABLET | Freq: Once | ORAL | Status: AC
  Administered 2023-02-26: 1000 mg via ORAL
  Filled 2023-02-26: qty 2

## 2023-02-26 MED ORDER — SODIUM CHLORIDE 0.9 % IV BOLUS
1000.0000 mL | Freq: Once | INTRAVENOUS | Status: AC
  Administered 2023-02-26: 1000 mL via INTRAVENOUS

## 2023-02-26 NOTE — ED Triage Notes (Addendum)
Pt presents with body aches, N/V/D, and cough that started after a recent hospital admission for COPD. Pt is currently on R/A, but states she is on 3L via N/C at home. Pt placed back on O2 at 3L.

## 2023-02-27 LAB — LACTIC ACID, PLASMA: Lactic Acid, Venous: 1.1 mmol/L (ref 0.5–1.9)

## 2023-02-27 LAB — RESP PANEL BY RT-PCR (RSV, FLU A&B, COVID)  RVPGX2
Influenza A by PCR: NEGATIVE
Influenza B by PCR: NEGATIVE
Resp Syncytial Virus by PCR: NEGATIVE
SARS Coronavirus 2 by RT PCR: NEGATIVE

## 2023-02-27 MED ORDER — ONDANSETRON 4 MG PO TBDP: 4.0000 mg | ORAL_TABLET | Freq: Three times a day (TID) | ORAL | 0 refills | Status: DC | PRN

## 2023-02-27 NOTE — Discharge Instructions (Signed)
You were seen today for nausea, vomiting, diarrhea, body aches.  Your testing is largely reassuring.  You were given fluids.  Make sure that you are taking in plenty of fluids at home.  Use Zofran as needed.  This is likely a viral syndrome.  Follow-up with primary physician.

## 2023-02-27 NOTE — ED Provider Notes (Signed)
[facility] Provider Note   CSN: [id number] Arrival date & time: 02/26/23  1934     History  No chief complaint on file.   Emily Fitzgerald is a 62 y.o. female.  HPI     This is a 62 year old female who presents with nausea, vomiting, diarrhea, body aches.  Patient reports that she was recently admitted to the hospital for COPD exacerbation.  She normally is on 3 L of oxygen.  She generally has not felt well since discharge and has had nonbloody, nonbilious emesis and nonbloody diarrhea.  No significant abdominal pain.  Reports baseline shortness of breath but nothing new.  Dry cough.  Does report chills but is unsure whether she has had fevers.  Home Medications Prior to Admission medications   Medication Sig Start Date End Date Taking? Authorizing Provider  ondansetron (ZOFRAN-ODT) 4 MG disintegrating tablet Take 1 tablet (4 mg total) by mouth every 8 (eight) hours as needed for nausea or vomiting. 02/27/23  Yes Jaquon Gingerich, Barbette Hair, MD  albuterol (VENTOLIN HFA) 108 (90 Base) MCG/ACT inhaler Inhale 1-2 puffs into the lungs every 6 (six) hours as needed for wheezing or shortness of breath. Patient taking differently: Inhale 2 puffs into the lungs every 6 (six) hours as needed for wheezing or shortness of breath. 07/17/21   Chesley Mires, MD  amLODipine (NORVASC) 10 MG tablet Take 1 tablet (10 mg total) by mouth daily. 11/05/22 01/04/23  Deatra James, MD  budesonide-formoterol (SYMBICORT) 80-4.5 MCG/ACT inhaler Inhale 2 puffs into the lungs 2 (two) times daily. Patient not taking: Reported on 02/16/2023 11/04/22 12/04/22  Deatra James, MD  carvedilol (COREG) 3.125 MG tablet Take 1 tablet (3.125 mg total) by mouth 2 (two) times daily with a meal. 02/18/23   Geradine Girt, DO  furosemide (LASIX) 20 MG tablet Take 1 tablet (20 mg total) by mouth daily. 02/18/23   Geradine Girt, DO  gabapentin (NEURONTIN) 100 MG capsule Take 100 mg by mouth 3  (three) times daily as needed (for pain).  10/02/18   [provider]  guaiFENesin-dextromethorphan (ROBITUSSIN DM) 100-10 MG/5ML syrup Take 10 mLs by mouth every 6 (six) hours. 11/04/22   Shahmehdi, Valeria Batman, MD  ipratropium-albuterol (DUONEB) 0.5-2.5 (3) MG/3ML SOLN Take 3 mLs by nebulization in the morning, at noon, and at bedtime. 11/04/22   Shahmehdi, Valeria Batman, MD  OXYGEN Inhale 3-4 L into the lungs continuous.    [provider]      Allergies    Patient has no known allergies.    Review of Systems   Review of Systems  Constitutional:  Positive for chills.  Gastrointestinal:  Positive for diarrhea, nausea and vomiting.  All other systems reviewed and are negative.   Physical Exam Updated Vital Signs BP 128/84   Pulse 88   Temp 98.6 F (37 C) (Oral)   Resp 16   Ht 1.702 m (5\' 7" )   Wt 95.3 kg   SpO2 94%   BMI 32.89 kg/m  Physical Exam Vitals and nursing note reviewed.  Constitutional:      Appearance: She is well-developed.     Comments: Medically ill-appearing but nontoxic and in no acute distress  HENT:     Head: Normocephalic and atraumatic.  Eyes:     Pupils: Pupils are equal, round, and reactive to light.  Cardiovascular:     Rate and Rhythm: Normal rate and regular rhythm.     Heart sounds: Normal  heart sounds.  Pulmonary:     Effort: Pulmonary effort is normal. No respiratory distress.     Breath sounds: Wheezing present.     Comments: Occasional wheeze, fair air movement Abdominal:     Palpations: Abdomen is soft.     Tenderness: There is no abdominal tenderness.  Musculoskeletal:     Cervical back: Neck supple.     Right lower leg: No edema.     Left lower leg: No edema.  Skin:    General: Skin is warm and dry.  Neurological:     Mental Status: She is alert and oriented to person, place, and time.     ED Results / Procedures / Treatments   Labs (all labs ordered are listed, but only abnormal results are displayed) Labs Reviewed   COMPREHENSIVE METABOLIC PANEL - Abnormal; Notable for the following components:      Result Value   Chloride 96 (*)    Glucose, Bld 159 (*)    Calcium 8.8 (*)    All other components within normal limits  CBC WITH DIFFERENTIAL/PLATELET - Abnormal; Notable for the following components:   WBC 19.1 (*)    RBC 5.98 (*)    Hemoglobin 18.4 (*)    HCT 59.0 (*)    Neutro Abs 9.7 (*)    Lymphs Abs 9.2 (*)    All other components within normal limits  URINALYSIS, ROUTINE W REFLEX MICROSCOPIC - Abnormal; Notable for the following components:   Color, Urine AMBER (*)    APPearance HAZY (*)    All other components within normal limits  SARS CORONAVIRUS 2 BY RT PCR  RESP PANEL BY RT-PCR (RSV, FLU A&B, COVID)  [id number]  LACTIC ACID, PLASMA  LACTIC ACID, PLASMA    EKG None  Radiology DG Chest 2 View  Result Date: 02/26/2023 CLINICAL DATA:  Body aches and cough. EXAM: CHEST - 2 VIEW COMPARISON:  February 15, 2023 FINDINGS: The heart size and mediastinal contours are within normal limits. Mild, diffuse, chronic appearing increased lung markings are seen. Mild scarring and/or atelectasis is noted within the left lung base. There is no evidence of a pleural effusion or pneumothorax. The visualized skeletal structures are unremarkable. IMPRESSION: Chronic appearing increased lung markings with mild left basilar scarring and/or atelectasis. Electronically Signed   By: Virgina Norfolk M.D.   On: 02/26/2023 21:53    Procedures Procedures    Medications Ordered in ED Medications  sodium chloride 0.9 % bolus 1,000 mL (1,000 mLs Intravenous New Bag/Given 02/26/23 2340)  acetaminophen (TYLENOL) tablet 1,000 mg (1,000 mg Oral Given 02/26/23 2341)    ED Course/ Medical Decision Making/ A&P                             Medical Decision Making Amount and/or Complexity of Data Reviewed Labs: ordered. Radiology: ordered.  Risk OTC drugs. Prescription drug management.   This patient presents to the ED  for concern of nausea, vomiting, diarrhea, this involves an extensive number of treatment options, and is a complaint that carries with it a high risk of complications and morbidity.  I considered the following differential and admission for this acute, potentially life threatening condition.  The differential diagnosis includes viral illness such as COVID or influenza, viral gastroenteritis, less likely SBO, pneumonia  MDM:    This is a 62 year old female who presents with generalized chills, vomiting and diarrhea.  She is nontoxic.  She is chronically ill-appearing.  She was placed on her home 3 L which is her baseline.  She is in no respiratory distress.  Labs obtained and reviewed.  She appears hemoconcentrated which is not far from baseline.  She does have mild leukocytosis but recently has been on steroids.  Otherwise lab workup is largely unremarkable including COVID and influenza testing.  Chest x-ray does not show any pneumonia.  Patient reports improvement after fluids and Zofran.  Highly suspect viral gastroenteritis.  Recommend supportive measures at home.  (Labs, imaging, consults)  Labs: I Ordered, and personally interpreted labs.  The pertinent results include: CBC, CMP, lactate, COVID, influenza  Imaging Studies ordered: I ordered imaging studies including chest x-ray I independently visualized and interpreted imaging. I agree with the radiologist interpretation  Additional history obtained from chart review.  External records from outside source obtained and reviewed including hospitalization  Cardiac Monitoring: The patient was maintained on a cardiac monitor.  If on the cardiac monitor, I personally viewed and interpreted the cardiac monitored which showed an underlying rhythm of: Sinus rhythm  Reevaluation: After the interventions noted above, I reevaluated the patient and found that they have :improved  Social Determinants of Health:  lives independently  Disposition:  discharge  Co morbidities that complicate the patient evaluation  Past Medical History:  Diagnosis Date   COPD (chronic obstructive pulmonary disease)    Influenza B 12/2017   Polycythemia    Seasonal allergies    Vitamin D deficiency      Medicines Meds ordered this encounter  Medications   sodium chloride 0.9 % bolus 1,000 mL   acetaminophen (TYLENOL) tablet 1,000 mg   ondansetron (ZOFRAN-ODT) 4 MG disintegrating tablet    Sig: Take 1 tablet (4 mg total) by mouth every 8 (eight) hours as needed for nausea or vomiting.    Dispense:  20 tablet    Refill:  0    I have reviewed the patients home medicines and have made adjustments as needed  Problem List / ED Course: Problem List Items Addressed This Visit   None Visit Diagnoses     Nausea vomiting and diarrhea    -  Primary                   Final Clinical Impression(s) / ED Diagnoses Final diagnoses:  Nausea vomiting and diarrhea    Rx / DC Orders ED Discharge Orders          Ordered    ondansetron (ZOFRAN-ODT) 4 MG disintegrating tablet  Every 8 hours PRN        02/27/23 0045              Merryl Hacker, MD 02/27/23 0050

## 2023-03-26 ENCOUNTER — Ambulatory Visit (INDEPENDENT_AMBULATORY_CARE_PROVIDER_SITE_OTHER): Payer: Medicaid Other | Admitting: Pulmonary Disease

## 2023-03-26 ENCOUNTER — Encounter: Payer: Self-pay | Admitting: Pulmonary Disease

## 2023-03-26 VITALS — BP 120/75 | HR 104 | Ht 66.0 in | Wt 209.2 lb

## 2023-03-26 DIAGNOSIS — J9611 Chronic respiratory failure with hypoxia: Secondary | ICD-10-CM

## 2023-03-26 DIAGNOSIS — Z720 Tobacco use: Secondary | ICD-10-CM | POA: Diagnosis not present

## 2023-03-26 DIAGNOSIS — D751 Secondary polycythemia: Secondary | ICD-10-CM

## 2023-03-26 DIAGNOSIS — J449 Chronic obstructive pulmonary disease, unspecified: Secondary | ICD-10-CM

## 2023-03-26 MED ORDER — BREZTRI AEROSPHERE 160-9-4.8 MCG/ACT IN AERO: 2.0000 | INHALATION_SPRAY | Freq: Two times a day (BID) | RESPIRATORY_TRACT | 5 refills | Status: AC

## 2023-03-26 NOTE — Patient Instructions (Signed)
Follow up in 4 months 

## 2023-03-26 NOTE — Progress Notes (Signed)
[facility]  Chief Complaint  Patient presents with   Follow-up    Pt f/u states that her breathing is baseline. She presents to clinic today w/o her oxygen tanks and states her "nose is painful and feels funny" so she decided to take a break from wearing it.   Offered pt a clinic tank and she denied needing it even though her O2 level was in the mid 70's. States that she will "tell me if she changes her mind"    Constitutional:  BP 120/75   Pulse (!) 104   Ht 5\' 6"  (1.676 m)   Wt 209 lb 3.2 oz (94.9 kg)   SpO2 (!) 76% Comment: offered pt oxygen she denied needing it  BMI 33.77 kg/m   Past Medical History:  Polycythemia, Allergies, Vit D deficiency, Influenza B February 2019, ETOH, Hypertension, B12 deficiency, GERD  Past Surgical History:  Her  has a past surgical history that includes Cesarean section.  Brief Summary:  Emily Fitzgerald is a 62 y.o. female former smoker with COPD and chronic respiratory failure with hypoxia and hypercapnia.       Subjective:   She is still smoking cigarettes.  She has trouble using her oxygen.  Her nose gets congested and sore.  Her SpO2 on room air was in the 70's when she walked into office today, but improved to above 90 when she sat for a bit.  She has cough with clear sputum.  She ran out of breztri and didn't realize she needed to have this refilled.  Using albuterol several times per day.  She is hoping to move by June.  There are several people who smoke at her current residence, and they use a wood heater during the Winter.   She was in hospital in March for a COPD exacerbation.  Her Hb from 02/26/23 was back up to 18.4.  Physical Exam:   Appearance - well kempt   ENMT - no sinus tenderness, no oral exudate, no LAN, Mallampati 3 airway, no stridor  Respiratory - decreased breath sounds bilaterally, no wheezing or rales  CV - s1s2 regular rate and rhythm, no murmurs  Ext - no  clubbing, no edema  Skin - no rashes  Psych - normal mood and affect       Pulmonary testing:  A1AT 09/09/20 >> 125, MM ABG 11/14/21 >> pH 7.308, PCO2 60.2, PO2 69.2 PFT 04/03/22 >> FEV1 0.95 (34%), FEV1% 47, TLC 7.48 (139%), DLCO 62%  Chest Imaging:  CT angio chest 11/14/21 >> enlarged mediastinal LN, bronchial wall thickening, tree in bud diffusely CT chest 04/09/22 >> resolution of ASD and adenopathy  Sleep Tests:  ONO with RA 11/14/20 >> test time 10 hrs 40 min.  Baseline SpO2 82%, low SpO2 59%.  Spent 9 hrs 54 min with SpO2 < 88%.  Cardiac Tests:  Echo 02/12/23 >> EF 65 to 70%, grade 1 DD, mild/mod RV dilation, no shunt  Social History:  She  reports that she has been smoking cigarettes. She has a 18.50 pack-year smoking history. She has never used smokeless tobacco. She reports current alcohol use. She reports that she does not use drugs.  Family History:  Her family history includes CAD in her brother; Cancer in her mother; Leukemia in her father.    Assessment/Plan:   Severe COPD with emphysema. - will refill script for breztri - prn albuterol - she has a nebulizer also; will arrange for new nebulizer supplies  Chronic respiratory failure with hypoxia and hypercapnia. - 3 liters at night and with exertion - goal SpO2 > 90% - will arrange for humidifier set up for her home oxygen and arrange for new nasal cannula  Tobacco abuse. 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 Elzie Rings with [facility] polycythemia. - followed by hematology at [facility] - explained to her the importance of being consistent with using her supplemental oxygen   Time Spent Involved in Patient Care on Day of Examination:  37 minutes  Follow up:   Patient Instructions  Follow up  in 4 months  Medication List:   Allergies as of 03/26/2023   No Known Allergies      Medication List        Accurate as of March 26, 2023 12:09 PM. If you have any questions, ask your nurse or doctor.          STOP taking these medications    amLODipine 10 MG tablet Commonly known as: NORVASC Stopped by: Coralyn Helling, MD   budesonide-formoterol 80-4.5 MCG/ACT inhaler Commonly known as: SYMBICORT Stopped by: Coralyn Helling, MD       TAKE these medications    albuterol 108 (90 Base) MCG/ACT inhaler Commonly known as: VENTOLIN HFA Inhale 1-2 puffs into the lungs every 6 (six) hours as needed for wheezing or shortness of breath. What changed: how much to take   [employer] 160-9-4.8 MCG/ACT Aero Generic drug: Budeson-Glycopyrrol-Formoterol Inhale 2 puffs into the lungs in the morning and at bedtime. Started by: Coralyn Helling, MD   carvedilol 3.125 MG tablet Commonly known as: COREG Take 1 tablet (3.125 mg total) by mouth 2 (two) times daily with a meal.   ergocalciferol 1.25 MG (50000 UT) capsule Commonly known as: VITAMIN D2 Take 50,000 Units by mouth once a week.   furosemide 20 MG tablet Commonly known as: LASIX Take 1 tablet (20 mg total) by mouth daily.   gabapentin 100 MG capsule Commonly known as: NEURONTIN Take 100 mg by mouth 3 (three) times daily as needed (for pain).   guaiFENesin-dextromethorphan 100-10 MG/5ML syrup Commonly known as: ROBITUSSIN DM Take 10 mLs by mouth every 6 (six) hours.   ipratropium-albuterol 0.5-2.5 (3) MG/3ML Soln Commonly known as: DUONEB Take 3 mLs by nebulization in the morning, at noon, and at bedtime.   ondansetron 4 MG disintegrating tablet Commonly known as: ZOFRAN-ODT Take 1 tablet (4 mg total) by mouth every 8 (eight) hours as needed for nausea or vomiting.   OXYGEN Inhale 3-4 L into the lungs continuous.        Signature:  Coralyn Helling, MD [facility] Pager - [phone] 03/26/2023, 12:09 PM

## 2023-05-07 ENCOUNTER — Ambulatory Visit: Payer: Medicaid Other | Admitting: Internal Medicine

## 2023-08-11 ENCOUNTER — Other Ambulatory Visit: Payer: Self-pay

## 2023-08-11 ENCOUNTER — Emergency Department (HOSPITAL_COMMUNITY)
Admission: EM | Admit: 2023-08-11 | Discharge: 2023-08-11 | Disposition: A | Discharge status: Home / Self Care | Payer: Medicaid Other | Attending: Emergency Medicine | Admitting: Emergency Medicine

## 2023-08-11 ENCOUNTER — Encounter (HOSPITAL_COMMUNITY): Payer: Self-pay | Admitting: *Deleted

## 2023-08-11 DIAGNOSIS — Z23 Encounter for immunization: Secondary | ICD-10-CM | POA: Insufficient documentation

## 2023-08-11 DIAGNOSIS — W540XXA Bitten by dog, initial encounter: Secondary | ICD-10-CM | POA: Insufficient documentation

## 2023-08-11 DIAGNOSIS — S51851A Open bite of right forearm, initial encounter: Secondary | ICD-10-CM | POA: Diagnosis present

## 2023-08-11 DIAGNOSIS — S41111A Laceration without foreign body of right upper arm, initial encounter: Secondary | ICD-10-CM

## 2023-08-11 MED ORDER — OXYCODONE-ACETAMINOPHEN 5-325 MG PO TABS
1.0000 | ORAL_TABLET | Freq: Once | ORAL | Status: AC
  Administered 2023-08-11: 1 via ORAL
  Filled 2023-08-11: qty 1

## 2023-08-11 MED ORDER — AMOXICILLIN-POT CLAVULANATE 875-125 MG PO TABS: 1.0000 | ORAL_TABLET | Freq: Two times a day (BID) | ORAL | 0 refills | Status: AC

## 2023-08-11 MED ORDER — TETANUS-DIPHTH-ACELL PERTUSSIS 5-2.5-18.5 LF-MCG/0.5 IM SUSY
0.5000 mL | PREFILLED_SYRINGE | Freq: Once | INTRAMUSCULAR | Status: AC
  Administered 2023-08-11: 0.5 mL via INTRAMUSCULAR
  Filled 2023-08-11: qty 0.5

## 2023-08-11 MED ORDER — LIDOCAINE-EPINEPHRINE (PF) 2 %-1:200000 IJ SOLN
10.0000 mL | Freq: Once | INTRAMUSCULAR | Status: AC
  Administered 2023-08-11: 10 mL via INTRADERMAL
  Filled 2023-08-11: qty 20

## 2023-08-11 MED ORDER — AMOXICILLIN-POT CLAVULANATE 875-125 MG PO TABS
1.0000 | ORAL_TABLET | Freq: Once | ORAL | Status: AC
  Administered 2023-08-11: 1 via ORAL
  Filled 2023-08-11: qty 1

## 2023-08-11 MED ORDER — OXYCODONE-ACETAMINOPHEN 5-325 MG PO TABS: 1.0000 | ORAL_TABLET | Freq: Four times a day (QID) | ORAL | 0 refills | Status: DC | PRN

## 2023-08-11 NOTE — ED Provider Notes (Signed)
[facility] EMERGENCY DEPARTMENT AT [facility] Provider Note   CSN: [id number] Arrival date & time: 08/11/23  1205     History Chief Complaint  Patient presents with   Animal Bite    Emily Fitzgerald is a 62 y.o. female.  Patient presents to the emergency department concerns of an animal bite.  Reports that she was bitten by her dog at home earlier today.  Bites noted to the right forearm.  Laceration to the anterior and posterior aspect of the right forearm.  Tdap unknown.  Patient reports that dog receive rabies booster about a week ago and has been previously up-to-date on rabies.  Dog is currently quarantined at the [employer].   Animal Bite      Home Medications Prior to Admission medications   Medication Sig Start Date End Date Taking? Authorizing Provider  amoxicillin-clavulanate (AUGMENTIN) 875-125 MG tablet Take 1 tablet by mouth every 12 (twelve) hours for 7 days. 08/11/23 08/18/23 Yes Smitty Knudsen, PA-C  oxyCODONE-acetaminophen (PERCOCET/ROXICET) 5-325 MG tablet Take 1 tablet by mouth every 6 (six) hours as needed for severe pain. 08/11/23  Yes Smitty Knudsen, PA-C  albuterol (VENTOLIN HFA) 108 (90 Base) MCG/ACT inhaler Inhale 1-2 puffs into the lungs every 6 (six) hours as needed for wheezing or shortness of breath. Patient taking differently: Inhale 2 puffs into the lungs every 6 (six) hours as needed for wheezing or shortness of breath. 07/17/21   Coralyn Helling, MD  Budeson-Glycopyrrol-Formoterol (BREZTRI AEROSPHERE) 160-9-4.8 MCG/ACT AERO Inhale 2 puffs into the lungs in the morning and at bedtime. 03/26/23   Coralyn Helling, MD  carvedilol (COREG) 3.125 MG tablet Take 1 tablet (3.125 mg total) by mouth 2 (two) times daily with a meal. 02/18/23   Marlin Canary U, DO  ergocalciferol (VITAMIN D2) 1.25 MG (50000 UT) capsule Take 50,000 Units by mouth once a week. 03/07/23   [provider]  furosemide (LASIX) 20 MG tablet Take 1 tablet (20 mg total) by mouth  daily. 02/18/23   Joseph Art, DO  gabapentin (NEURONTIN) 100 MG capsule Take 100 mg by mouth 3 (three) times daily as needed (for pain).  10/02/18   [provider]  guaiFENesin-dextromethorphan (ROBITUSSIN DM) 100-10 MG/5ML syrup Take 10 mLs by mouth every 6 (six) hours. 11/04/22   Shahmehdi, Gemma Payor, MD  ipratropium-albuterol (DUONEB) 0.5-2.5 (3) MG/3ML SOLN Take 3 mLs by nebulization in the morning, at noon, and at bedtime. 11/04/22   Shahmehdi, Gemma Payor, MD  ondansetron (ZOFRAN-ODT) 4 MG disintegrating tablet Take 1 tablet (4 mg total) by mouth every 8 (eight) hours as needed for nausea or vomiting. 02/27/23   Horton, Mayer Masker, MD  OXYGEN Inhale 3-4 L into the lungs continuous.    [provider]      Allergies    Patient has no known allergies.    Review of Systems   Review of Systems  Skin:  Positive for wound.  All other systems reviewed and are negative.   Physical Exam Updated Vital Signs BP (!) 140/92 (BP Location: Right Arm)   Pulse 85   Temp 97.7 F (36.5 C)   Resp (!) 21   Ht 5\' 6"  (1.676 m)   Wt 96.7 kg   SpO2 99%   BMI 34.41 kg/m  Physical Exam Vitals and nursing note reviewed.  HENT:     Head: Normocephalic and atraumatic.  Eyes:     General: No scleral icterus.       Right  eye: No discharge.        Left eye: No discharge.  Cardiovascular:     Rate and Rhythm: Normal rate and regular rhythm.  Skin:    General: Skin is warm and dry.     Findings: Lesion present. No rash.     Comments: 4cm laceration to the anterior right forearm with tendon visible underneath.  Anterior right forearm with multiple superficial abrasion and flap laceration with minimal depth.        ED Results / Procedures / Treatments   Labs (all labs ordered are listed, but only abnormal results are displayed) Labs Reviewed - No data to display  EKG None  Radiology No results found.  Procedures .Marland KitchenLaceration Repair  Date/Time: 08/11/2023 2:45  PM  Performed by: Smitty Knudsen, PA-C Authorized by: Smitty Knudsen, PA-C   Consent:    Consent obtained:  Verbal   Consent given by:  Patient   Risks discussed:  Infection, pain and poor cosmetic result   Alternatives discussed:  Referral Anesthesia:    Anesthesia method:  Local infiltration   Local anesthetic:  Lidocaine 2% WITH epi Laceration details:    Location:  Shoulder/arm   Shoulder/arm location:  R lower arm   Length (cm):  8   Depth (mm):  4 Pre-procedure details:    Preparation:  Patient was prepped and draped in usual sterile fashion Exploration:    Hemostasis achieved with:  LET   Imaging outcome: foreign body not noted     Contaminated: no   Treatment:    Area cleansed with:  Povidone-iodine and saline   Amount of cleaning:  Extensive   Irrigation solution:  Sterile saline   Irrigation volume:  1000cc   Irrigation method:  Pressure wash   Visualized foreign bodies/material removed: no     Debridement:  None Skin repair:    Repair method:  Sutures   Suture size:  5-0   Suture material:  Nylon   Suture technique:  Simple interrupted   Number of sutures:  7 Approximation:    Approximation:  Loose Repair type:    Repair type:  Simple Post-procedure details:    Dressing:  Non-adherent dressing   Procedure completion:  Tolerated    Medications Ordered in ED Medications  lidocaine-EPINEPHrine (XYLOCAINE W/EPI) 2 %-1:200000 (PF) injection 10 mL (has no administration in time range)  oxyCODONE-acetaminophen (PERCOCET/ROXICET) 5-325 MG per tablet 1 tablet (1 tablet Oral Given 08/11/23 1323)  Tdap (BOOSTRIX) injection 0.5 mL (0.5 mLs Intramuscular Given 08/11/23 1322)  amoxicillin-clavulanate (AUGMENTIN) 875-125 MG per tablet 1 tablet (1 tablet Oral Given 08/11/23 1324)    ED Course/ Medical Decision Making/ A&P                               Medical Decision Making Risk Prescription drug management.   This patient presents to the ED for concern of dog  bite.  Differential diagnosis includes rabies, dog bite, laceration, tendon injury   Medicines ordered and prescription drug management:  I ordered medication including Percocet, Augmentin, Tdap for pain, infection prevention, Tdap prevention Reevaluation of the patient after these medicines showed that the patient was examined I have reviewed the patients home medicines and have made adjustments as needed   Problem List / ED Course:  Patient presents to the emergency department concerns of a dog bite.  Reports she sustained a dog bite to the right lower forearm.  States that  the dog is her own individual dog and the dog is up-to-date on rabies.  Prior full immunization recently given a rabies booster approximately 1 week ago.  Reports intact mobility and sensation in the right arm.  No other area of injury. Given the depth of the lacerations on the antecubital right forearm laceration as well as the dorsal aspect laceration towards the wrist, both legs will be loosely closed with sutures to prevent wound infection or complications.  Will initiate patient on antibiotic therapy including Augmentin for management treatment of dog bite.  Also sent a short course of Percocet for some pain control.  Advised patient to follow-up with primary care provider, urgent care, or the emergency department for suture removal in approximately 7 to 10 days.  Discussed signs of infection and strict turn precautions.  Patient otherwise has no questions and stable for discharge home.    Final Clinical Impression(s) / ED Diagnoses Final diagnoses:  Dog bite, initial encounter  Laceration of right upper extremity, initial encounter    Rx / DC Orders ED Discharge Orders          Ordered    amoxicillin-clavulanate (AUGMENTIN) 875-125 MG tablet  Every 12 hours        08/11/23 1429    oxyCODONE-acetaminophen (PERCOCET/ROXICET) 5-325 MG tablet  Every 6 hours PRN        08/11/23 1429              Smitty Knudsen, PA-C 08/11/23 1447    Franne Forts, DO 08/11/23 2325

## 2023-08-11 NOTE — Discharge Instructions (Addendum)
You were seen in the ER today for a dog bite. Since your dog is up to date on rabies, no rabies shots were given but you were started on antibiotics to prevent infection. You also had two lacerations sutured given the size of them but closed loosely to allow for continued drainage and reduce the risk of infection.

## 2023-08-11 NOTE — ED Notes (Signed)
Dc instructions and scripts reviewed with pt no questions or concerns a this time. Will follow up with pcp as needed.

## 2023-08-11 NOTE — ED Triage Notes (Signed)
Pt states her dog bite her today to right lower forearm.  Lac noted and several abrasions noted.  Last tetanus shot unknown. Pt states that the dog rabies vaccine last week.  Pt states she contacted [employer] and they took the dog.

## 2023-08-12 NOTE — Progress Notes (Deleted)
Emily Fitzgerald, female    DOB: 16-Oct-1961    MRN: [id number]   Brief patient profile:  ***  yo*** *** referred to pulmonary clinic in [employer]  08/13/2023 by *** for ***      History of Present Illness  08/13/2023  Pulmonary/ 1st office eval/ Sherene Sires / [place] [facility]  No chief complaint on file.    Dyspnea:  *** Cough: *** Sleep: *** SABA use: *** 02: *** Lung cancer screen: ***   Outpatient Medications Prior to Visit  Medication Sig Dispense Refill   albuterol (VENTOLIN HFA) 108 (90 Base) MCG/ACT inhaler Inhale 1-2 puffs into the lungs every 6 (six) hours as needed for wheezing or shortness of breath. (Patient taking differently: Inhale 2 puffs into the lungs every 6 (six) hours as needed for wheezing or shortness of breath.) 8 g 5   amoxicillin-clavulanate (AUGMENTIN) 875-125 MG tablet Take 1 tablet by mouth every 12 (twelve) hours for 7 days. 13 tablet 0   Budeson-Glycopyrrol-Formoterol (BREZTRI AEROSPHERE) 160-9-4.8 MCG/ACT AERO Inhale 2 puffs into the lungs in the morning and at bedtime. 10.7 g 5   carvedilol (COREG) 3.125 MG tablet Take 1 tablet (3.125 mg total) by mouth 2 (two) times daily with a meal. 60 tablet 0   ergocalciferol (VITAMIN D2) 1.25 MG (50000 UT) capsule Take 50,000 Units by mouth once a week.     furosemide (LASIX) 20 MG tablet Take 1 tablet (20 mg total) by mouth daily. 30 tablet 0   gabapentin (NEURONTIN) 100 MG capsule Take 100 mg by mouth 3 (three) times daily as needed (for pain).      guaiFENesin-dextromethorphan (ROBITUSSIN DM) 100-10 MG/5ML syrup Take 10 mLs by mouth every 6 (six) hours. 118 mL 0   ipratropium-albuterol (DUONEB) 0.5-2.5 (3) MG/3ML SOLN Take 3 mLs by nebulization in the morning, at noon, and at bedtime. 360 mL 5   ondansetron (ZOFRAN-ODT) 4 MG disintegrating tablet Take 1 tablet (4 mg total) by mouth every 8 (eight) hours as needed for nausea or vomiting. 20 tablet 0   oxyCODONE-acetaminophen (PERCOCET/ROXICET) 5-325 MG tablet Take 1  tablet by mouth every 6 (six) hours as needed for severe pain. 7 tablet 0   OXYGEN Inhale 3-4 L into the lungs continuous.     No facility-administered medications prior to visit.    Past Medical History:  Diagnosis Date   COPD (chronic obstructive pulmonary disease) (HCC)    Influenza B 12/2017   Polycythemia    Seasonal allergies    Vitamin D deficiency       Objective:     There were no vitals taken for this visit.         Assessment   No problem-specific Assessment & Plan notes found for this encounter.     Sandrea Hughs, MD 08/12/2023

## 2023-08-13 ENCOUNTER — Ambulatory Visit: Payer: Medicaid Other | Admitting: Internal Medicine

## 2023-08-19 ENCOUNTER — Other Ambulatory Visit: Payer: Self-pay

## 2023-08-19 ENCOUNTER — Encounter (HOSPITAL_COMMUNITY): Payer: Self-pay | Admitting: Emergency Medicine

## 2023-08-19 ENCOUNTER — Emergency Department (HOSPITAL_COMMUNITY)
Admission: EM | Admit: 2023-08-19 | Discharge: 2023-08-19 | Disposition: A | Discharge status: Home / Self Care | Payer: Medicaid Other | Attending: Emergency Medicine | Admitting: Emergency Medicine

## 2023-08-19 DIAGNOSIS — W540XXD Bitten by dog, subsequent encounter: Secondary | ICD-10-CM | POA: Insufficient documentation

## 2023-08-19 DIAGNOSIS — Z4802 Encounter for removal of sutures: Secondary | ICD-10-CM | POA: Diagnosis not present

## 2023-08-19 DIAGNOSIS — S41111D Laceration without foreign body of right upper arm, subsequent encounter: Secondary | ICD-10-CM | POA: Diagnosis present

## 2023-08-19 NOTE — ED Triage Notes (Signed)
Pt presents with suture removal of right arm.

## 2023-08-19 NOTE — ED Provider Notes (Signed)
[place] EMERGENCY DEPARTMENT AT [facility] Provider Note   CSN: [id number] Arrival date & time: 08/19/23  1428     History  Chief Complaint  Patient presents with   Suture / Staple Removal    Right arm    Emily Fitzgerald is a 62 y.o. female.  Patient with noncontributory past medical history presents today requesting suture removal.  She states that she had sutures placed in her right arm after she was bitten by her dog 1 week ago. She denies any issues with the wound. She was given Augmentin which she filled and took and has 1 dose left. No fevers or chills.  The history is provided by the patient. No language interpreter was used.  Suture / Staple Removal       Home Medications Prior to Admission medications   Medication Sig Start Date End Date Taking? Authorizing Provider  albuterol (VENTOLIN HFA) 108 (90 Base) MCG/ACT inhaler Inhale 1-2 puffs into the lungs every 6 (six) hours as needed for wheezing or shortness of breath. Patient taking differently: Inhale 2 puffs into the lungs every 6 (six) hours as needed for wheezing or shortness of breath. 07/17/21   Coralyn Helling, MD  Budeson-Glycopyrrol-Formoterol (BREZTRI AEROSPHERE) 160-9-4.8 MCG/ACT AERO Inhale 2 puffs into the lungs in the morning and at bedtime. 03/26/23   Coralyn Helling, MD  carvedilol (COREG) 3.125 MG tablet Take 1 tablet (3.125 mg total) by mouth 2 (two) times daily with a meal. 02/18/23   Marlin Canary U, DO  ergocalciferol (VITAMIN D2) 1.25 MG (50000 UT) capsule Take 50,000 Units by mouth once a week. 03/07/23   [provider]  furosemide (LASIX) 20 MG tablet Take 1 tablet (20 mg total) by mouth daily. 02/18/23   Joseph Art, DO  gabapentin (NEURONTIN) 100 MG capsule Take 100 mg by mouth 3 (three) times daily as needed (for pain).  10/02/18   [provider]  guaiFENesin-dextromethorphan (ROBITUSSIN DM) 100-10 MG/5ML syrup Take 10 mLs by mouth every 6 (six) hours. 11/04/22    Shahmehdi, Gemma Payor, MD  ipratropium-albuterol (DUONEB) 0.5-2.5 (3) MG/3ML SOLN Take 3 mLs by nebulization in the morning, at noon, and at bedtime. 11/04/22   Shahmehdi, Gemma Payor, MD  ondansetron (ZOFRAN-ODT) 4 MG disintegrating tablet Take 1 tablet (4 mg total) by mouth every 8 (eight) hours as needed for nausea or vomiting. 02/27/23   Horton, Mayer Masker, MD  oxyCODONE-acetaminophen (PERCOCET/ROXICET) 5-325 MG tablet Take 1 tablet by mouth every 6 (six) hours as needed for severe pain. 08/11/23   Smitty Knudsen, PA-C  OXYGEN Inhale 3-4 L into the lungs continuous.    [provider]      Allergies    Patient has no known allergies.    Review of Systems   Review of Systems  All other systems reviewed and are negative.   Physical Exam Updated Vital Signs BP (!) 152/84 (BP Location: Right Arm)   Pulse 81   Temp 98.6 F (37 C) (Oral)   Resp 18   Ht 5\' 6"  (1.676 m)   Wt 96.7 kg   SpO2 94%   BMI 34.41 kg/m  Physical Exam Vitals and nursing note reviewed.  Constitutional:      General: She is not in acute distress.    Appearance: Normal appearance. She is normal weight. She is not ill-appearing, toxic-appearing or diaphoretic.  HENT:     Head: Normocephalic and atraumatic.  Cardiovascular:     Rate and Rhythm: Normal  rate.  Pulmonary:     Effort: Pulmonary effort is normal. No respiratory distress.  Musculoskeletal:        General: Normal range of motion.     Cervical back: Normal range of motion.  Skin:    General: Skin is warm and dry.     Comments: Well healing wounds noted to the distal right wrist and forearm. Sutures in place.  Neurological:     General: No focal deficit present.     Mental Status: She is alert.  Psychiatric:        Mood and Affect: Mood normal.        Behavior: Behavior normal.     ED Results / Procedures / Treatments   Labs (all labs ordered are listed, but only abnormal results are displayed) Labs Reviewed - No data to  display  EKG None  Radiology No results found.  Procedures .Suture Removal  Date/Time: 08/19/2023 3:41 PM  Performed by: Silva Bandy, PA-C Authorized by: Silva Bandy, PA-C   Consent:    Consent obtained:  Verbal   Consent given by:  Patient   Risks, benefits, and alternatives were discussed: yes     Risks discussed:  Bleeding, pain and wound separation   Alternatives discussed:  No treatment, delayed treatment, alternative treatment, observation and referral Universal protocol:    Procedure explained and questions answered to patient or proxy's satisfaction: yes     Patient identity confirmed:  Verbally with patient Location:    Location:  Upper extremity   Upper extremity location:  Arm   Arm location:  R lower arm Procedure details:    Wound appearance:  No signs of infection, good wound healing and clean   Number of sutures removed:  7 Post-procedure details:    Post-removal:  Band-Aid applied   Procedure completion:  Tolerated well, no immediate complications     Medications Ordered in ED Medications - No data to display  ED Course/ Medical Decision Making/ A&P                                 Medical Decision Making  Staple removal   Pt to ER for staple/suture removal and wound check as above.  She is afebrile, nontoxic-appearing, in no acute distress with reassuring vital signs.  Chart reviewed, patient had 7 sutures placed in the emergency department after a dog bite 8 days ago.  Procedure tolerated well per above procedure. Vitals normal, no signs of infection. Scar minimization & return precautions given at dc. Evaluation and diagnostic testing in the emergency department does not suggest an emergent condition requiring admission or immediate intervention beyond what has been performed at this time.  Plan for discharge with close PCP follow-up.  Patient is understanding and amenable with plan, educated on red flag symptoms that would prompt immediate  return.  Patient discharged in stable condition.  Final Clinical Impression(s) / ED Diagnoses Final diagnoses:  Encounter for removal of sutures    Rx / DC Orders ED Discharge Orders     None     An After Visit Summary was printed and given to the patient.     Vear Clock 08/19/23 1546    Terrilee Files, MD 08/19/23 2037

## 2023-08-19 NOTE — Discharge Instructions (Signed)
You were seen in the emergency department for suture/staple removal.  Your wound appears to be healing well. Please keep it clean and appropriately dressed.   Return if development of any new or worsening symptoms.

## 2023-11-04 ENCOUNTER — Other Ambulatory Visit (HOSPITAL_COMMUNITY): Payer: Self-pay | Admitting: Family Medicine

## 2023-11-04 DIAGNOSIS — Z1231 Encounter for screening mammogram for malignant neoplasm of breast: Secondary | ICD-10-CM

## 2023-11-12 ENCOUNTER — Encounter (HOSPITAL_COMMUNITY): Payer: Self-pay | Admitting: Family Medicine

## 2023-11-26 ENCOUNTER — Other Ambulatory Visit (HOSPITAL_COMMUNITY): Payer: Self-pay | Admitting: Family Medicine

## 2023-11-26 DIAGNOSIS — R921 Mammographic calcification found on diagnostic imaging of breast: Secondary | ICD-10-CM

## 2024-03-26 ENCOUNTER — Encounter: Admitting: Internal Medicine

## 2024-05-25 NOTE — Progress Notes (Deleted)
 Emily Fitzgerald, female    DOB: Apr 27, 1961    MRN: [id number]   Brief patient profile:  64  yo*** *** former Emily Fitzgerald Pt self-referred back to pulmonary clinic in [place]  05/28/2024  for ***      History of Present Illness  05/28/2024  Pulmonary/ 1st office eval/ Emily [facility] / [facility]  No chief complaint on file.    Dyspnea:  *** Cough: *** Sleep: *** SABA use: *** 02 use:*** LDSCT:***  No obvious day to day or daytime pattern/variability or assoc excess/ purulent sputum or mucus plugs or hemoptysis or cp or chest tightness, subjective wheeze or overt sinus or hb symptoms.    Also denies any obvious fluctuation of symptoms with weather or environmental changes or other aggravating or alleviating factors except as outlined above   No unusual exposure hx or h/o childhood pna/ asthma or knowledge of premature birth.  Current Allergies, Complete Past Medical History, Past Surgical History, Family History, and Social History were reviewed in [employer] record.  ROS  The following are not active complaints unless bolded Hoarseness, sore throat, dysphagia, dental problems, itching, sneezing,  nasal congestion or discharge of excess mucus or purulent secretions, ear ache,   fever, chills, sweats, unintended wt loss or wt gain, classically pleuritic or exertional cp,  orthopnea pnd or arm/hand swelling  or leg swelling, presyncope, palpitations, abdominal pain, anorexia, nausea, vomiting, diarrhea  or change in bowel habits or change in bladder habits, change in stools or change in urine, dysuria, hematuria,  rash, arthralgias, visual complaints, headache, numbness, weakness or ataxia or problems with walking or coordination,  change in mood or  memory.            Outpatient Medications Prior to Visit  Medication Sig Dispense Refill   albuterol  (VENTOLIN  HFA) 108 (90 Base) MCG/ACT inhaler Inhale 1-2 puffs into the lungs every 6 (six) hours as needed for wheezing or  shortness of breath. (Patient taking differently: Inhale 2 puffs into the lungs every 6 (six) hours as needed for wheezing or shortness of breath.) 8 g 5   Budeson-Glycopyrrol-Formoterol  (BREZTRI  AEROSPHERE) 160-9-4.8 MCG/ACT AERO Inhale 2 puffs into the lungs in the morning and at bedtime. 10.7 g 5   carvedilol  (COREG ) 3.125 MG tablet Take 1 tablet (3.125 mg total) by mouth 2 (two) times daily with a meal. 60 tablet 0   ergocalciferol (VITAMIN D2) 1.25 MG (50000 UT) capsule Take 50,000 Units by mouth once a week.     furosemide  (LASIX ) 20 MG tablet Take 1 tablet (20 mg total) by mouth daily. 30 tablet 0   gabapentin  (NEURONTIN ) 100 MG capsule Take 100 mg by mouth 3 (three) times daily as needed (for pain).      guaiFENesin -dextromethorphan  (ROBITUSSIN DM) 100-10 MG/5ML syrup Take 10 mLs by mouth every 6 (six) hours. 118 mL 0   ipratropium-albuterol  (DUONEB) 0.5-2.5 (3) MG/3ML SOLN Take 3 mLs by nebulization in the morning, at noon, and at bedtime. 360 mL 5   ondansetron  (ZOFRAN -ODT) 4 MG disintegrating tablet Take 1 tablet (4 mg total) by mouth every 8 (eight) hours as needed for nausea or vomiting. 20 tablet 0   oxyCODONE -acetaminophen  (PERCOCET/ROXICET) 5-325 MG tablet Take 1 tablet by mouth every 6 (six) hours as needed for severe pain. 7 tablet 0   OXYGEN  Inhale 3-4 L into the lungs continuous.     No facility-administered medications prior to visit.    Past Medical History:  Diagnosis Date   COPD (chronic  obstructive pulmonary disease) (HCC)    Influenza B 12/2017   Polycythemia    Seasonal allergies    Vitamin D deficiency       Objective:     There were no vitals taken for this visit.         Assessment   No problem-specific Assessment & Plan notes found for this encounter.     Ozell America, MD 05/25/2024

## 2024-05-28 ENCOUNTER — Ambulatory Visit: Admitting: Internal Medicine

## 2024-08-31 ENCOUNTER — Emergency Department (HOSPITAL_COMMUNITY)

## 2024-08-31 ENCOUNTER — Inpatient Hospital Stay (HOSPITAL_COMMUNITY)
Admission: EM | Admit: 2024-08-31 | Discharge: 2024-09-02 | DRG: 445 | Disposition: A | Discharge status: Home / Self Care | Attending: Internal Medicine | Admitting: Internal Medicine

## 2024-08-31 ENCOUNTER — Other Ambulatory Visit: Payer: Self-pay

## 2024-08-31 ENCOUNTER — Encounter (HOSPITAL_COMMUNITY): Payer: Self-pay | Admitting: Emergency Medicine

## 2024-08-31 DIAGNOSIS — K219 Gastro-esophageal reflux disease without esophagitis: Secondary | ICD-10-CM | POA: Diagnosis present

## 2024-08-31 DIAGNOSIS — Z8249 Family history of ischemic heart disease and other diseases of the circulatory system: Secondary | ICD-10-CM

## 2024-08-31 DIAGNOSIS — Z79899 Other long term (current) drug therapy: Secondary | ICD-10-CM

## 2024-08-31 DIAGNOSIS — I110 Hypertensive heart disease with heart failure: Secondary | ICD-10-CM | POA: Diagnosis present

## 2024-08-31 DIAGNOSIS — K810 Acute cholecystitis: Secondary | ICD-10-CM | POA: Diagnosis present

## 2024-08-31 DIAGNOSIS — E782 Mixed hyperlipidemia: Secondary | ICD-10-CM | POA: Diagnosis present

## 2024-08-31 DIAGNOSIS — Z604 Social exclusion and rejection: Secondary | ICD-10-CM | POA: Diagnosis present

## 2024-08-31 DIAGNOSIS — Z5941 Food insecurity: Secondary | ICD-10-CM

## 2024-08-31 DIAGNOSIS — Z6834 Body mass index (BMI) 34.0-34.9, adult: Secondary | ICD-10-CM

## 2024-08-31 DIAGNOSIS — K819 Cholecystitis, unspecified: Secondary | ICD-10-CM | POA: Diagnosis not present

## 2024-08-31 DIAGNOSIS — Z9981 Dependence on supplemental oxygen: Secondary | ICD-10-CM | POA: Diagnosis not present

## 2024-08-31 DIAGNOSIS — Z806 Family history of leukemia: Secondary | ICD-10-CM | POA: Diagnosis not present

## 2024-08-31 DIAGNOSIS — R7401 Elevation of levels of liver transaminase levels: Secondary | ICD-10-CM | POA: Diagnosis not present

## 2024-08-31 DIAGNOSIS — I5032 Chronic diastolic (congestive) heart failure: Secondary | ICD-10-CM | POA: Diagnosis present

## 2024-08-31 DIAGNOSIS — J9611 Chronic respiratory failure with hypoxia: Secondary | ICD-10-CM | POA: Diagnosis present

## 2024-08-31 DIAGNOSIS — Z7951 Long term (current) use of inhaled steroids: Secondary | ICD-10-CM

## 2024-08-31 DIAGNOSIS — Z720 Tobacco use: Secondary | ICD-10-CM | POA: Diagnosis not present

## 2024-08-31 DIAGNOSIS — C9110 Chronic lymphocytic leukemia of B-cell type not having achieved remission: Secondary | ICD-10-CM | POA: Diagnosis present

## 2024-08-31 DIAGNOSIS — J449 Chronic obstructive pulmonary disease, unspecified: Secondary | ICD-10-CM | POA: Diagnosis present

## 2024-08-31 DIAGNOSIS — D72829 Elevated white blood cell count, unspecified: Secondary | ICD-10-CM | POA: Diagnosis present

## 2024-08-31 DIAGNOSIS — Z809 Family history of malignant neoplasm, unspecified: Secondary | ICD-10-CM | POA: Diagnosis not present

## 2024-08-31 DIAGNOSIS — F17210 Nicotine dependence, cigarettes, uncomplicated: Secondary | ICD-10-CM | POA: Diagnosis present

## 2024-08-31 DIAGNOSIS — E66811 Obesity, class 1: Secondary | ICD-10-CM | POA: Diagnosis present

## 2024-08-31 DIAGNOSIS — D72820 Lymphocytosis (symptomatic): Secondary | ICD-10-CM | POA: Diagnosis present

## 2024-08-31 LAB — CBC WITH DIFFERENTIAL/PLATELET
Basophils Absolute: 0 K/uL (ref 0.0–0.1)
Basophils Relative: 0 %
Eosinophils Absolute: 0 K/uL (ref 0.0–0.5)
Eosinophils Relative: 0 %
HCT: 40.4 % (ref 36.0–46.0)
Hemoglobin: 12.6 g/dL (ref 12.0–15.0)
Lymphocytes Relative: 81 %
Lymphs Abs: 41.7 K/uL — ABNORMAL HIGH (ref 0.7–4.0)
MCH: 31.2 pg (ref 26.0–34.0)
MCHC: 31.2 g/dL (ref 30.0–36.0)
MCV: 100 fL (ref 80.0–100.0)
Monocytes Absolute: 0.5 K/uL (ref 0.1–1.0)
Monocytes Relative: 1 %
Neutro Abs: 9.3 K/uL — ABNORMAL HIGH (ref 1.7–7.7)
Neutrophils Relative %: 18 %
Platelets: 188 K/uL (ref 150–400)
RBC: 4.04 MIL/uL (ref 3.87–5.11)
RDW: 12.6 % (ref 11.5–15.5)
Smear Review: NORMAL
WBC: 51.5 K/uL (ref 4.0–10.5)
nRBC: 0 % (ref 0.0–0.2)

## 2024-08-31 LAB — MAGNESIUM: Magnesium: 2.2 mg/dL (ref 1.7–2.4)

## 2024-08-31 LAB — BASIC METABOLIC PANEL WITH GFR
Anion gap: 11 (ref 5–15)
BUN: 15 mg/dL (ref 8–23)
CO2: 30 mmol/L (ref 22–32)
Calcium: 10 mg/dL (ref 8.9–10.3)
Chloride: 99 mmol/L (ref 98–111)
Creatinine, Ser: 0.67 mg/dL (ref 0.44–1.00)
GFR, Estimated: >60 mL/min (ref >60)
Glucose, Bld: 115 mg/dL — ABNORMAL HIGH (ref 70–99)
Potassium: 4.5 mmol/L (ref 3.5–5.1)
Sodium: 141 mmol/L (ref 135–145)

## 2024-08-31 LAB — HEPATIC FUNCTION PANEL
ALT: 59 U/L — ABNORMAL HIGH (ref 0–44)
AST: 134 U/L — ABNORMAL HIGH (ref 15–41)
Albumin: 4 g/dL (ref 3.5–5.0)
Alkaline Phosphatase: 139 U/L — ABNORMAL HIGH (ref 38–126)
Bilirubin, Direct: 0.2 mg/dL (ref 0.0–0.2)
Indirect Bilirubin: 0.5 mg/dL (ref 0.3–0.9)
Total Bilirubin: 0.7 mg/dL (ref 0.0–1.2)
Total Protein: 6.6 g/dL (ref 6.5–8.1)

## 2024-08-31 LAB — URIC ACID: Uric Acid, Serum: 4.6 mg/dL (ref 2.5–7.1)

## 2024-08-31 LAB — TROPONIN T, HIGH SENSITIVITY
Troponin T High Sensitivity: <15 ng/L (ref 0–19)
Troponin T High Sensitivity: <15 ng/L (ref 0–19)

## 2024-08-31 LAB — LACTATE DEHYDROGENASE: LDH: 264 U/L — ABNORMAL HIGH (ref 98–192)

## 2024-08-31 LAB — LIPASE, BLOOD: Lipase: 23 U/L (ref 11–51)

## 2024-08-31 MED ORDER — ONDANSETRON HCL 4 MG PO TABS: 4.0000 mg | ORAL_TABLET | Freq: Four times a day (QID) | ORAL | Status: DC | PRN

## 2024-08-31 MED ORDER — LIDOCAINE VISCOUS HCL 2 % MT SOLN
15.0000 mL | Freq: Once | OROMUCOSAL | Status: AC
  Administered 2024-08-31: 15 mL via ORAL
  Filled 2024-08-31: qty 15

## 2024-08-31 MED ORDER — ONDANSETRON HCL 4 MG/2ML IJ SOLN: 4.0000 mg | Freq: Four times a day (QID) | INTRAMUSCULAR | Status: DC | PRN

## 2024-08-31 MED ORDER — ALUM & MAG HYDROXIDE-SIMETH 200-200-20 MG/5ML PO SUSP
30.0000 mL | Freq: Once | ORAL | Status: AC
  Administered 2024-08-31: 30 mL via ORAL
  Filled 2024-08-31: qty 30

## 2024-08-31 MED ORDER — HYDROMORPHONE HCL 1 MG/ML IJ SOLN: 0.5000 mg | INTRAMUSCULAR | Status: DC | PRN

## 2024-08-31 MED ORDER — IOHEXOL 300 MG/ML  SOLN
100.0000 mL | Freq: Once | INTRAMUSCULAR | Status: AC | PRN
Start: 2024-08-31 — End: 2024-08-31
  Administered 2024-08-31: 100 mL via INTRAVENOUS

## 2024-08-31 MED ORDER — HYDROMORPHONE HCL 1 MG/ML IJ SOLN
0.5000 mg | Freq: Once | INTRAMUSCULAR | Status: AC
  Administered 2024-08-31: 0.5 mg via INTRAVENOUS
  Filled 2024-08-31: qty 0.5

## 2024-08-31 MED ORDER — FAMOTIDINE IN NACL 20-0.9 MG/50ML-% IV SOLN
20.0000 mg | Freq: Once | INTRAVENOUS | Status: AC
  Administered 2024-08-31: 20 mg via INTRAVENOUS
  Filled 2024-08-31: qty 50

## 2024-08-31 MED ORDER — PIPERACILLIN-TAZOBACTAM 3.375 G IVPB
3.3750 g | Freq: Once | INTRAVENOUS | Status: AC
  Administered 2024-08-31: 3.375 g via INTRAVENOUS
  Filled 2024-08-31: qty 50

## 2024-08-31 MED ORDER — PIPERACILLIN-TAZOBACTAM 3.375 G IVPB
3.3750 g | Freq: Three times a day (TID) | INTRAVENOUS | Status: DC
  Administered 2024-09-01 – 2024-09-02 (×5): 3.375 g via INTRAVENOUS
  Filled 2024-08-31 (×5): qty 50

## 2024-08-31 MED ORDER — HYDROMORPHONE HCL 1 MG/ML IJ SOLN
0.5000 mg | INTRAMUSCULAR | Status: DC | PRN
  Administered 2024-09-01 – 2024-09-02 (×3): 0.5 mg via INTRAVENOUS
  Filled 2024-08-31 (×3): qty 0.5

## 2024-08-31 MED ORDER — HYDROMORPHONE HCL 1 MG/ML IJ SOLN
1.0000 mg | Freq: Once | INTRAMUSCULAR | Status: AC
  Administered 2024-08-31: 1 mg via INTRAVENOUS
  Filled 2024-08-31: qty 1

## 2024-08-31 NOTE — ED Notes (Signed)
 Assumed care of pt, found her alert and oriented in bed.  Pt c/o pain but informed RN that provider just left room and said they will order pain medication.

## 2024-08-31 NOTE — Consult Note (Signed)
 ED Pharmacy Antibiotic Sign Off An antibiotic consult was received from an ED provider for an intra-abdominal infection per pharmacy dosing for Zosyn . A chart review was completed to assess appropriateness.   The following one time order(s) were placed:  Zosyn  3.375 G x1  Further antibiotic and/or antibiotic pharmacy consults should be ordered by the admitting provider if indicated.   Thank you for allowing pharmacy to be a part of this patient's care.   Annabella LOISE Banks, [facility]  Clinical Pharmacist 08/31/24 7:53 PM

## 2024-08-31 NOTE — ED Provider Notes (Addendum)
 [place] [facility] AT [facility] Provider Note   CSN: [id number] Arrival date & time: 08/31/24  1547     Patient presents with: Abdominal Pain   Emily Fitzgerald is a 63 y.o. female.   HPI Patient presents for epigastric pain.  Medical history includes COPD, GERD, HTN, CHF.  Earlier today, she was in her normal state of health.  Around 2 PM, she had some scrambled eggs for lunch.  Shortly thereafter, she developed severe epigastric discomfort.  She denies nausea but feels like she has to burp.  She took 3 Tums without relief.  EMS was called.  EMS noted normal vital signs.  Patient is on 3 L of supplemental oxygen  at baseline.  She states that she has had similar episodes in the past but never this severe.      Prior to Admission medications   Medication Sig Start Date End Date Taking? Authorizing Provider  albuterol  (VENTOLIN  HFA) 108 (90 Base) MCG/ACT inhaler Inhale 1-2 puffs into the lungs every 6 (six) hours as needed for wheezing or shortness of breath. Patient taking differently: Inhale 2 puffs into the lungs every 6 (six) hours as needed for wheezing or shortness of breath. 07/17/21  Yes Shellia Oh, MD  Budeson-Glycopyrrol-Formoterol  (BREZTRI  AEROSPHERE) 160-9-4.8 MCG/ACT AERO Inhale 2 puffs into the lungs in the morning and at bedtime. 03/26/23  Yes Sood, Vineet, MD  carvedilol  (COREG ) 3.125 MG tablet Take 1 tablet (3.125 mg total) by mouth 2 (two) times daily with a meal. 02/18/23  Yes Vann, Jessica U, DO  cetirizine (ZYRTEC) 10 MG tablet Take 10 mg by mouth daily. 08/06/24  Yes [provider]  ergocalciferol (VITAMIN D2) 1.25 MG (50000 UT) capsule Take 50,000 Units by mouth once a week. 03/07/23  Yes [provider]  gabapentin  (NEURONTIN ) 100 MG capsule Take 100 mg by mouth 3 (three) times daily as needed (for pain).  10/02/18  Yes [provider]  pravastatin (PRAVACHOL) 20 MG tablet Take 20 mg by mouth daily. 08/06/24  Yes [provider]  furosemide  (LASIX ) 20 MG tablet Take 1 tablet (20 mg total) by mouth daily. 02/18/23   Vann, Jessica U, DO  guaiFENesin -dextromethorphan  (ROBITUSSIN DM) 100-10 MG/5ML syrup Take 10 mLs by mouth every 6 (six) hours. 11/04/22   Shahmehdi, Adriana LABOR, MD  ipratropium-albuterol  (DUONEB) 0.5-2.5 (3) MG/3ML SOLN Take 3 mLs by nebulization in the morning, at noon, and at bedtime. 11/04/22   Shahmehdi, Adriana LABOR, MD  ondansetron  (ZOFRAN -ODT) 4 MG disintegrating tablet Take 1 tablet (4 mg total) by mouth every 8 (eight) hours as needed for nausea or vomiting. 02/27/23   Horton, Charmaine FALCON, MD  oxyCODONE -acetaminophen  (PERCOCET/ROXICET) 5-325 MG tablet Take 1 tablet by mouth every 6 (six) hours as needed for severe pain. 08/11/23   Zelaya, Oscar A, PA-C  OXYGEN  Inhale 3-4 L into the lungs continuous.    [provider]    Allergies: Patient has no known allergies.    Review of Systems  Gastrointestinal:  Positive for abdominal pain.  All other systems reviewed and are negative.   Updated Vital Signs BP (!) 149/71   Pulse 70   Temp 98.2 F (36.8 C)   Resp 15   Ht 5' 6 (1.676 m)   Wt 97 kg   SpO2 96%   BMI 34.52 kg/m   Physical Exam Vitals and nursing note reviewed.  Constitutional:      General: She is not in acute distress.    Appearance:  Normal appearance. She is well-developed. She is not ill-appearing, toxic-appearing or diaphoretic.  HENT:     Head: Normocephalic and atraumatic.     Right Ear: External ear normal.     Left Ear: External ear normal.     Nose: Nose normal.     Mouth/Throat:     Mouth: Mucous membranes are moist.  Eyes:     Extraocular Movements: Extraocular movements intact.     Conjunctiva/sclera: Conjunctivae normal.  Cardiovascular:     Rate and Rhythm: Normal rate and regular rhythm.     Heart sounds: No murmur heard. Pulmonary:     Effort: No respiratory distress.     Breath sounds: Normal breath sounds. No wheezing or rales.  Abdominal:      General: There is no distension.     Palpations: Abdomen is soft.     Tenderness: There is abdominal tenderness. There is no guarding or rebound.  Musculoskeletal:        General: No swelling. Normal range of motion.     Cervical back: Normal range of motion and neck supple.  Skin:    General: Skin is warm and dry.  Neurological:     General: No focal deficit present.     Mental Status: She is alert and oriented to person, place, and time.  Psychiatric:        Mood and Affect: Mood normal.        Behavior: Behavior normal.     (all labs ordered are listed, but only abnormal results are displayed) Labs Reviewed  BASIC METABOLIC PANEL WITH GFR - Abnormal; Notable for the following components:      Result Value   Glucose, Bld 115 (*)    All other components within normal limits  HEPATIC FUNCTION PANEL - Abnormal; Notable for the following components:   AST 134 (*)    ALT 59 (*)    Alkaline Phosphatase 139 (*)    All other components within normal limits  CBC WITH DIFFERENTIAL/PLATELET - Abnormal; Notable for the following components:   WBC 51.5 (*)    Neutro Abs 9.3 (*)    Lymphs Abs 41.7 (*)    All other components within normal limits  LIPASE, BLOOD  MAGNESIUM   PATHOLOGIST SMEAR REVIEW  FLOW CYTOMETRY  LACTATE DEHYDROGENASE  SEDIMENTATION RATE  C-REACTIVE PROTEIN  ANTINUCLEAR ANTIBODIES, IFA  RHEUMATOID FACTOR  URIC ACID  CBG MONITORING, ED  TROPONIN T, HIGH SENSITIVITY  TROPONIN T, HIGH SENSITIVITY    EKG: EKG Interpretation Date/Time:  Monday August 31 2024 16:44:15 EDT Ventricular Rate:  86 PR Interval:  160 QRS Duration:  82 QT Interval:  346 QTC Calculation: 414 R Axis:   62  Text Interpretation: Sinus rhythm Confirmed by Melvenia Motto [phone]) on 08/31/2024 4:54:33 PM  Radiology: CT ABDOMEN PELVIS W CONTRAST Result Date: 08/31/2024 EXAM: CT ABDOMEN AND PELVIS WITH CONTRAST 08/31/2024 06:57:22 PM TECHNIQUE: CT of the abdomen and pelvis was performed  with the administration of intravenous contrast, 100mL (iohexol  (OMNIPAQUE ) 300 MG/ML solution). Multiplanar reformatted images are provided for review. Automated exposure control, iterative reconstruction, and/or weight-based adjustment of the mA/kV was utilized to reduce the radiation dose to as low as reasonably achievable. COMPARISON: None available. CLINICAL HISTORY: Abdominal pain, acute, nonlocalized. Pt c/o upper abd pain x 3 hours. States he took 3 tums with no relief. FINDINGS: LOWER CHEST: No acute abnormality. LIVER: The liver is unremarkable. GALLBLADDER AND BILE DUCTS: The gallbladder is distended with multiple layering stones seen within the  gallbladder neck at the origin of the cystic duct. There is superimposed pericholecystic edema, but no changes of acute on ruptured cholecystitis. SPLEEN: No acute abnormality. PANCREAS: No acute abnormality. 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 The left adrenal gland is unremarkable. KIDNEYS, URETERS AND BLADDER: No stones in the kidneys or ureters. No hydronephrosis. No perinephric or periureteral stranding. Circum aortic left renal vein. Urinary bladder is unremarkable. GI AND BOWEL: Stomach demonstrates no acute abnormality. There is no bowel obstruction. A small fat-containing umbilical hernia is noted. PERITONEUM AND RETROPERITONEUM: No ascites. No free air. VASCULATURE: Mild aortoiliac atherosclerotic calcification is present. The abdominal vasculature is otherwise unremarkable. LYMPH NODES: No lymphadenopathy. REPRODUCTIVE ORGANS: No acute abnormality. BONES AND SOFT TISSUES: Osseous structures are age appropriate. No acute bone abnormality. No lytic or blastic bone lesion. IMPRESSION: 1. Acute cholecystitis. Electronically signed by: Dorethia Molt MD 08/31/2024 07:22 PM EDT RP Workstation: [id number]   DG Chest Portable 1 View Result Date:  08/31/2024 CLINICAL DATA:  Upper abdominal pain EXAM: PORTABLE CHEST - 1 VIEW COMPARISON:  February 26, 2023 FINDINGS: Chronic coarsening of the pulmonary interstitium without overt pulmonary edema. No focal airspace consolidation, pleural effusion, or pneumothorax. Mild cardiomegaly. Aortic atherosclerosis. No acute fracture or destructive lesions. Multilevel thoracic osteophytosis. IMPRESSION: No acute cardiopulmonary abnormality. Electronically Signed   By: Rogelia Myers M.D.   On: 08/31/2024 17:01     Procedures   Medications Ordered in the ED  piperacillin -tazobactam (ZOSYN ) IVPB 3.375 g (has no administration in time range)  HYDROmorphone (DILAUDID) injection 1 mg (has no administration in time range)  HYDROmorphone (DILAUDID) injection 0.5 mg (has no administration in time range)  alum & mag hydroxide-simeth (MAALOX/MYLANTA) 200-200-20 MG/5ML suspension 30 mL (30 mLs Oral Given 08/31/24 1640)    And  lidocaine  (XYLOCAINE ) 2 % viscous mouth solution 15 mL (15 mLs Oral Given 08/31/24 1640)  famotidine (PEPCID) IVPB 20 mg premix (0 mg Intravenous Stopped 08/31/24 1734)  HYDROmorphone (DILAUDID) injection 0.5 mg (0.5 mg Intravenous Given 08/31/24 1638)  iohexol  (OMNIPAQUE ) 300 MG/ML solution 100 mL (100 mLs Intravenous Contrast Given 08/31/24 1832)                                    Medical Decision Making Amount and/or Complexity of Data Reviewed Labs: ordered. Radiology: ordered.  Risk OTC drugs. Prescription drug management. Decision regarding hospitalization.   This patient presents to the ED for concern of epigastric pain, this involves an extensive number of treatment options, and is a complaint that carries with it a high risk of complications and morbidity.  The differential diagnosis includes gastritis, PUD, cholecystitis, pancreatitis, ACS, GERD   Co morbidities / Chronic conditions that complicate the patient evaluation  COPD, GERD, HTN, CHF   Additional history  obtained:  Additional history obtained from EMR External records from outside source obtained and reviewed including EMS   Lab Tests:  I Ordered, and personally interpreted labs.  The pertinent results include: Severe leukocytosis with lymphocyte predominance; lab work otherwise unremarkable   Imaging Studies ordered:  I ordered imaging studies including CT of abdomen and pelvis I independently visualized and interpreted imaging which showed findings consistent with acute cholecystitis I agree with the radiologist interpretation   Cardiac Monitoring: / EKG:  The patient was maintained on a cardiac monitor.  I personally viewed and interpreted  the cardiac monitored which showed an underlying rhythm of: Sinus rhythm   Problem List / ED Course / Critical interventions / Medication management  Patient presenting for acute onset of epigastric pain this afternoon, 2 hours prior to arrival.  Pain was refractory to 3 Tums at home.  On arrival in the ED, vital signs notable for moderate hypertension.  Patient is overall well-appearing on exam.  She endorses ongoing severe epigastric discomfort.  She does have mild tenderness to this area.  She does have a current pursed lip breathing which she states is baseline for her.  She is on her home 3 L of oxygen .  No wheezing is appreciated on auscultation.  Dilaudid and GI cocktail were ordered.  Workup was initiated.  EKG shows no concerning ST segment abnormalities.  Lab work notable for markedly elevated leukocytosis with lymphocyte predominance.  I spoke with NP Burns with oncology team who recommended Flow cytometry, LDH, Peripheral smear, sed rate, CRP, ANA, RF and uric acid.  These were ordered.  NP Burns to schedule close outpatient follow-up.  On reassessment, patient endorsing ongoing discomfort.  Additional Dilaudid was ordered.  CT scan did show evidence of acute cholecystitis.  Antibiotics were ordered and general surgery was consulted.  I  spoke with general surgeon on-call, Dr. Evonnie, who recommends antibiotics, n.p.o. at midnight, admission here at [facility], right upper quadrant ultrasound in the morning with plan for IR consultation for consideration of PCT after further imaging.  I updated NP Burns, who will be able to see in consult tomorrow.  Patient was admitted for further management. I ordered medication including Dilaudid and GI cocktail for analgesia; Zosyn  for cholecystitis Reevaluation of the patient after these medicines showed that the patient improved I have reviewed the patients home medicines and have made adjustments as needed   Consultations Obtained:  I requested consultation with the surgeon, Dr. Evonnie,  and discussed lab and imaging findings as well as pertinent plan - they recommend: Admission to [facility], ultrasound in the morning, likely IR consultation for PCT tomorrow after ultrasound I requested consultation with the oncology team, NP Burns,  and discussed lab and imaging findings as well as pertinent plan - they recommend: Additional lab work.  Will see in consult tomorrow.   Social Determinants of Health:  Lives independently     Final diagnoses:  Cholecystitis  Lymphocytosis    ED Discharge Orders     None          Melvenia Motto, MD 08/31/24 ARTEMUS    Melvenia Motto, MD 08/31/24 2000

## 2024-08-31 NOTE — ED Notes (Signed)
Pharmacy bedside 

## 2024-08-31 NOTE — ED Triage Notes (Signed)
 Pt c/o upper abd pain x 3 hours. States he took 3 tums with no relief.

## 2024-08-31 NOTE — H&P (Signed)
 History and Physical    Patient: Emily Fitzgerald FMW:[id number] DOB: 01-23-1961 DOA: 08/31/2024 DOS: the patient was seen and examined on 08/31/2024 PCP: [facility]  Patient coming from: Home  Chief Complaint:  Chief Complaint  Patient presents with   Abdominal Pain   HPI: Emily Fitzgerald is a 63 y.o. female with medical history significant of COPD, tobacco abuse, chronic respiratory failure on supplemental oxygen  at 3 LPM of oxygen  at baseline, hyperlipidemia who presents to the emergency department due to abdominal pain which started today around 2 PM.  Patient states that she had scrambled eggs for lunch and shortly after this, she developed epigastric pain which was severe and associated with burping without nausea or vomiting.  Pain was constant, sharp in nature and rated as 7-8/10 on pain severity.  3 Tums taken provided no relief.she decided to go to the ED for further evaluation and management.  ED Course: In the emergency department, BP was 149/71, O2 sats was 95% on supplemental oxygen  at 3 LPM.  Other vital signs are within normal range.  Workup in the ED showed normal CBC except for WBC of 51.5 with lymphocyte predominant.  BMP was normal except for blood glucose of 115.  AST 134, ALT 59, ALP 139.  Magnesium  2.2, troponin x 2 was normal.  Lipase 23 CT abdomen pelvis with contrast was suggestive of acute cholecystitis Chest x-ray showed no acute cardiopulmonary abnormality GI cocktail was provided, patient was treated with Pepcid and Dilaudid.  IV Zosyn  was given. General surgery (Dr. Evonnie) was consulted and recommended admitting patient here at [facility], antibiotics, n.p.o. at midnight, right upper quadrant ultrasound in the morning with plan for IR consultation for consideration of PCT after further imaging per EDP Oncology (NP Burns) was consulted regarding patient's leukocytosis with lymphocytosis and patient was seen in consult per EDP [facility] was asked to admit  patient.  Review of Systems: Review of systems as noted in the HPI. All other systems reviewed and are negative.   Past Medical History:  Diagnosis Date   COPD (chronic obstructive pulmonary disease) (HCC)    Influenza B 12/2017   Polycythemia    Seasonal allergies    Vitamin D deficiency    Past Surgical History:  Procedure Laterality Date   CESAREAN SECTION      Social History:  reports that she quit smoking about 2 years ago. Her smoking use included cigarettes. She started smoking about 39 years ago. She has a 18.5 pack-year smoking history. She has never used smokeless tobacco. She reports that she does not currently use alcohol. She reports that she does not use drugs.   No Known Allergies  Family History  Problem Relation Age of Onset   Cancer Mother    Leukemia Father    CAD Brother      Prior to Admission medications   Medication Sig Start Date End Date Taking? Authorizing Provider  albuterol  (VENTOLIN  HFA) 108 (90 Base) MCG/ACT inhaler Inhale 1-2 puffs into the lungs every 6 (six) hours as needed for wheezing or shortness of breath. Patient taking differently: Inhale 2 puffs into the lungs every 6 (six) hours as needed for wheezing or shortness of breath. 07/17/21  Yes Shellia Oh, MD  Budeson-Glycopyrrol-Formoterol  (BREZTRI  AEROSPHERE) 160-9-4.8 MCG/ACT AERO Inhale 2 puffs into the lungs in the morning and at bedtime. 03/26/23  Yes Sood, Vineet, MD  carvedilol  (COREG ) 3.125 MG tablet Take 1 tablet (3.125 mg total) by mouth 2 (two) times daily with a meal.  02/18/23  Yes Vann, Jessica U, DO  cetirizine (ZYRTEC) 10 MG tablet Take 10 mg by mouth daily. 08/06/24  Yes [provider]  ergocalciferol (VITAMIN D2) 1.25 MG (50000 UT) capsule Take 50,000 Units by mouth every Sunday. 03/07/23  Yes [provider]  gabapentin  (NEURONTIN ) 100 MG capsule Take 100 mg by mouth 3 (three) times daily as needed (for pain).  10/02/18  Yes [provider]   ipratropium-albuterol  (DUONEB) 0.5-2.5 (3) MG/3ML SOLN Take 3 mLs by nebulization in the morning, at noon, and at bedtime. Patient taking differently: Take 3 mLs by nebulization every 4 (four) hours. 11/04/22  Yes Shahmehdi, Seyed A, MD  OXYGEN  Inhale 3-4 L into the lungs continuous.   Yes [provider]  pravastatin (PRAVACHOL) 20 MG tablet Take 20 mg by mouth daily. 08/06/24  Yes [provider]    Physical Exam: BP (!) 127/51   Pulse 65   Temp 98.2 F (36.8 C)   Resp 15   Ht 5' 6 (1.676 m)   Wt 97 kg   SpO2 93%   BMI 34.52 kg/m   General: 63 y.o. year-old female well developed well nourished in no acute distress.  Alert and oriented x3. HEENT: NCAT, EOMI Neck: Supple, trachea medial Cardiovascular: Regular rate and rhythm with no rubs or gallops.  No thyromegaly or JVD noted.  No lower extremity edema. 2/4 pulses in all 4 extremities. Respiratory: Clear to auscultation with no wheezes or rales. Good inspiratory effort. Abdomen: Soft, tender to palpation of epigastric area.  Nondistended with normal bowel sounds x4 quadrants. Muskuloskeletal: No cyanosis, clubbing or edema noted bilaterally Neuro: CN II-XII intact, strength 5/5 x 4, sensation, reflexes intact Skin: No ulcerative lesions noted or rashes Psychiatry: Judgement and insight appear normal. Mood is appropriate for condition and setting          Labs on Admission:  Basic Metabolic Panel: Recent Labs  Lab 08/31/24 1640  NA 141  K 4.5  CL 99  CO2 30  GLUCOSE 115*  BUN 15  CREATININE 0.67  CALCIUM 10.0  MG 2.2   Liver Function Tests: Recent Labs  Lab 08/31/24 1640  AST 134*  ALT 59*  ALKPHOS 139*  BILITOT 0.7  PROT 6.6  ALBUMIN 4.0   Recent Labs  Lab 08/31/24 1640  LIPASE 23   No results for input(s): AMMONIA in the last 168 hours. CBC: Recent Labs  Lab 08/31/24 1640  WBC 51.5*  NEUTROABS 9.3*  HGB 12.6  HCT 40.4  MCV 100.0  PLT 188   Cardiac Enzymes: No results  for input(s): CKTOTAL, CKMB, CKMBINDEX, TROPONINI in the last 168 hours.  BNP (last 3 results) No results for input(s): BNP in the last 8760 hours.  ProBNP (last 3 results) No results for input(s): PROBNP in the last 8760 hours.  CBG: No results for input(s): GLUCAP in the last 168 hours.  Radiological Exams on Admission: CT ABDOMEN PELVIS W CONTRAST Result Date: 08/31/2024 EXAM: CT ABDOMEN AND PELVIS WITH CONTRAST 08/31/2024 06:57:22 PM TECHNIQUE: CT of the abdomen and pelvis was performed with the administration of intravenous contrast, 100mL (iohexol  (OMNIPAQUE ) 300 MG/ML solution). Multiplanar reformatted images are provided for review. Automated exposure control, iterative reconstruction, and/or weight-based adjustment of the mA/kV was utilized to reduce the radiation dose to as low as reasonably achievable. COMPARISON: None available. CLINICAL HISTORY: Abdominal pain, acute, nonlocalized. Pt c/o upper abd pain x 3 hours. States he took 3 tums with no relief. FINDINGS: LOWER CHEST:  No acute abnormality. LIVER: The liver is unremarkable. GALLBLADDER AND BILE DUCTS: The gallbladder is distended with multiple layering stones seen within the gallbladder neck at the origin of the cystic duct. There is superimposed pericholecystic edema, but no changes of acute on ruptured cholecystitis. SPLEEN: No acute abnormality. PANCREAS: No acute abnormality. 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 The left adrenal gland is unremarkable. KIDNEYS, URETERS AND BLADDER: No stones in the kidneys or ureters. No hydronephrosis. No perinephric or periureteral stranding. Circum aortic left renal vein. Urinary bladder is unremarkable. GI AND BOWEL: Stomach demonstrates no acute abnormality. There is no bowel obstruction. A small fat-containing umbilical hernia is noted. PERITONEUM AND RETROPERITONEUM: No ascites.  No free air. VASCULATURE: Mild aortoiliac atherosclerotic calcification is present. The abdominal vasculature is otherwise unremarkable. LYMPH NODES: No lymphadenopathy. REPRODUCTIVE ORGANS: No acute abnormality. BONES AND SOFT TISSUES: Osseous structures are age appropriate. No acute bone abnormality. No lytic or blastic bone lesion. IMPRESSION: 1. Acute cholecystitis. Electronically signed by: Dorethia Molt MD 08/31/2024 07:22 PM EDT RP Workstation: [id number]   DG Chest Portable 1 View Result Date: 08/31/2024 CLINICAL DATA:  Upper abdominal pain EXAM: PORTABLE CHEST - 1 VIEW COMPARISON:  February 26, 2023 FINDINGS: Chronic coarsening of the pulmonary interstitium without overt pulmonary edema. No focal airspace consolidation, pleural effusion, or pneumothorax. Mild cardiomegaly. Aortic atherosclerosis. No acute fracture or destructive lesions. Multilevel thoracic osteophytosis. IMPRESSION: No acute cardiopulmonary abnormality. Electronically Signed   By: Rogelia Myers M.D.   On: 08/31/2024 17:01    EKG: I independently viewed the EKG done and my findings are as followed: Normal sinus rhythm at a rate of 86 bpm  Assessment/Plan Present on Admission:  Acute cholecystitis  Transaminasemia  Leukocytosis  COPD (chronic obstructive pulmonary disease) (HCC)  Chronic diastolic CHF (congestive heart failure) (HCC)  Tobacco abuse  Principal Problem:   Acute cholecystitis Active Problems:   COPD (chronic obstructive pulmonary disease) (HCC)   Transaminasemia   Leukocytosis   Tobacco abuse   Chronic diastolic CHF (congestive heart failure) (HCC)   Chronic respiratory failure with hypoxia (HCC)   Mixed hyperlipidemia  Acute cholecystitis Continue IV hydration Patient was started on IV Zosyn , which she will continue continue with same at this time Continue IV Dilaudid 0.5 mg q.3h p.r.n. for moderate to severe pain Continue IV Zofran  p.r.n. Patient will be placed n.p.o. at midnight RUQ ultrasound  in the morning General Surgery Dr. Evonnie) was consulted and will follow-up with patient in the morning per EDP  Transaminasemia possibly due to above AST 134, ALT 59, ALP 159 Continue to monitor liver enzymes  Leukocytosis Patient presents with WBC of 51.5 and lymphocyte Abs of 41.7 Oncology team (NP Burns) was consulted and will follow-up with patient in the morning per EDP  COPD (not in acute exacerbation) Continue Breztri , albuterol  as needed  Chronic respiratory failure with hypoxia Continue supplemental oxygen  to maintain O2 sats > 92%  Mixed hyperlipidemia Continue statin  Chronic diastolic CHF Continue total input/output, daily weights and fluid restriction Continue Coreg , statin per home regimen Echocardiogram on 09/15/2020 showed LVEF of 60 to 65%.  No WMA.  Mild LVH.  G1 DD    Tobacco abuse Patient was counseled on tobacco abuse cessation   DVT prophylaxis: SCDs  Code Status: Full code  Family Communication: None at bedside  Consults: General Surgery, oncology  Severity of Illness: The appropriate patient status for this patient  is INPATIENT. Inpatient status is judged to be reasonable and necessary in order to provide the required intensity of service to ensure the patient's safety. The patient's presenting symptoms, physical exam findings, and initial radiographic and laboratory data in the context of their chronic comorbidities is felt to place them at high risk for further clinical deterioration. Furthermore, it is not anticipated that the patient will be medically stable for discharge from the hospital within 2 midnights of admission.   * I certify that at the point of admission it is my clinical judgment that the patient will require inpatient hospital care spanning beyond 2 midnights from the point of admission due to high intensity of service, high risk for further deterioration and high frequency of surveillance required.*  Author: Odean Fester,  DO 08/31/2024 10:17 PM  For on call review [email or web address].

## 2024-09-01 ENCOUNTER — Encounter (HOSPITAL_COMMUNITY): Payer: Self-pay | Admitting: Internal Medicine

## 2024-09-01 ENCOUNTER — Inpatient Hospital Stay (HOSPITAL_COMMUNITY)

## 2024-09-01 DIAGNOSIS — K819 Cholecystitis, unspecified: Secondary | ICD-10-CM

## 2024-09-01 DIAGNOSIS — R7401 Elevation of levels of liver transaminase levels: Secondary | ICD-10-CM | POA: Diagnosis not present

## 2024-09-01 DIAGNOSIS — D72820 Lymphocytosis (symptomatic): Secondary | ICD-10-CM | POA: Diagnosis not present

## 2024-09-01 DIAGNOSIS — E782 Mixed hyperlipidemia: Secondary | ICD-10-CM | POA: Diagnosis not present

## 2024-09-01 LAB — COMPREHENSIVE METABOLIC PANEL WITH GFR
ALT: 122 U/L — ABNORMAL HIGH (ref 0–44)
AST: 116 U/L — ABNORMAL HIGH (ref 15–41)
Albumin: 4 g/dL (ref 3.5–5.0)
Alkaline Phosphatase: 142 U/L — ABNORMAL HIGH (ref 38–126)
Anion gap: 11 (ref 5–15)
BUN: 12 mg/dL (ref 8–23)
CO2: 32 mmol/L (ref 22–32)
Calcium: 9.7 mg/dL (ref 8.9–10.3)
Chloride: 96 mmol/L — ABNORMAL LOW (ref 98–111)
Creatinine, Ser: 0.61 mg/dL (ref 0.44–1.00)
GFR, Estimated: >60 mL/min (ref >60)
Glucose, Bld: 97 mg/dL (ref 70–99)
Potassium: 4.1 mmol/L (ref 3.5–5.1)
Sodium: 139 mmol/L (ref 135–145)
Total Bilirubin: 0.9 mg/dL (ref 0.0–1.2)
Total Protein: 6.4 g/dL — ABNORMAL LOW (ref 6.5–8.1)

## 2024-09-01 LAB — PATHOLOGIST SMEAR REVIEW

## 2024-09-01 LAB — CBC
HCT: 38.1 % (ref 36.0–46.0)
Hemoglobin: 12 g/dL (ref 12.0–15.0)
MCH: 31 pg (ref 26.0–34.0)
MCHC: 31.5 g/dL (ref 30.0–36.0)
MCV: 98.4 fL (ref 80.0–100.0)
Platelets: 181 K/uL (ref 150–400)
RBC: 3.87 MIL/uL (ref 3.87–5.11)
RDW: 12.7 % (ref 11.5–15.5)
WBC: 43.5 K/uL — ABNORMAL HIGH (ref 4.0–10.5)
nRBC: 0 % (ref 0.0–0.2)

## 2024-09-01 LAB — HIV ANTIBODY (ROUTINE TESTING W REFLEX): HIV Screen 4th Generation wRfx: NONREACTIVE

## 2024-09-01 LAB — PHOSPHORUS: Phosphorus: 3.4 mg/dL (ref 2.5–4.6)

## 2024-09-01 LAB — C-REACTIVE PROTEIN: CRP: 0.5 mg/dL (ref <1.0)

## 2024-09-01 LAB — MAGNESIUM: Magnesium: 2.1 mg/dL (ref 1.7–2.4)

## 2024-09-01 MED ORDER — CARVEDILOL 3.125 MG PO TABS
3.1250 mg | ORAL_TABLET | Freq: Two times a day (BID) | ORAL | Status: DC
  Administered 2024-09-01 (×2): 3.125 mg via ORAL
  Filled 2024-09-01 (×3): qty 1

## 2024-09-01 MED ORDER — ALBUTEROL SULFATE (2.5 MG/3ML) 0.083% IN NEBU
2.5000 mg | INHALATION_SOLUTION | Freq: Four times a day (QID) | RESPIRATORY_TRACT | Status: DC | PRN
  Administered 2024-09-01 – 2024-09-02 (×3): 2.5 mg via RESPIRATORY_TRACT
  Filled 2024-09-01 (×3): qty 3

## 2024-09-01 MED ORDER — PRAVASTATIN SODIUM 40 MG PO TABS
20.0000 mg | ORAL_TABLET | Freq: Every day | ORAL | Status: DC
Start: 2024-09-01 — End: 2024-09-01
  Administered 2024-09-01: 20 mg via ORAL
  Filled 2024-09-01: qty 1

## 2024-09-01 MED ORDER — BUDESON-GLYCOPYRROL-FORMOTEROL 160-9-4.8 MCG/ACT IN AERO
2.0000 | INHALATION_SPRAY | Freq: Two times a day (BID) | RESPIRATORY_TRACT | Status: DC
  Administered 2024-09-01 – 2024-09-02 (×3): 2 via RESPIRATORY_TRACT
  Filled 2024-09-01 (×2): qty 5.9

## 2024-09-01 MED ORDER — ALBUTEROL SULFATE HFA 108 (90 BASE) MCG/ACT IN AERS: 1.0000 | INHALATION_SPRAY | Freq: Four times a day (QID) | RESPIRATORY_TRACT | Status: DC | PRN

## 2024-09-01 MED ORDER — INFLUENZA VIRUS VACC SPLIT PF (FLUZONE) 0.5 ML IM SUSY
0.5000 mL | PREFILLED_SYRINGE | INTRAMUSCULAR | Status: DC
  Filled 2024-09-01: qty 0.5

## 2024-09-01 NOTE — Plan of Care (Signed)

## 2024-09-01 NOTE — Assessment & Plan Note (Addendum)
 Significant elevation in white count greater than 50.  Peripheral smear shows smudge cells. Patient is actively being treated for cholecystitis with antibiotics. Discussed with ED physician adding flow cytometry, uric acid, LDH, ANA, rheumatoid factor, CRP and ESR. She denies any B symptoms. Previously patient has been seen for polycythemia and received phlebotomies. On chart review, it looks like she has had an elevated white count for at least a year. We are still awaiting additional labs including flow cytometry suspicion CLL. Discussed with patient outpatient follow-up with us  in our clinic in approximately 3 to 4 weeks.  Will get that scheduled for her.

## 2024-09-01 NOTE — Consult Note (Signed)
 CONSULT NOTE  Patient Care Team: [facility] as PCP - Diedre America, Ozell NOVAK, MD as Consulting Physician (Pulmonary Disease)  CHIEF COMPLAINTS/PURPOSE OF CONSULTATION:  Lymphocytosis  HISTORY OF PRESENTING ILLNESS:  Emily Fitzgerald 63 y.o. female who was admitted to [facility] for abdominal pain and found to have cholecystitis.   During workup, she was found to have an elevated white count of 51.5 with predominantly lymphocytes.  Peripheral smear showed smudge cells.  Patient was admitted for an ultrasound in the morning.   MEDICAL HISTORY:  Past Medical History:  Diagnosis Date   COPD (chronic obstructive pulmonary disease) (HCC)    Influenza B 12/2017   Polycythemia    Seasonal allergies    Vitamin D deficiency     SURGICAL HISTORY: Past Surgical History:  Procedure Laterality Date   CESAREAN SECTION      SOCIAL HISTORY: Social History   Socioeconomic History   Marital status: Single    Spouse name: Not on file   Number of children: Not on file   Years of education: Not on file   Highest education level: Not on file  Occupational History   Not on file  Tobacco Use   Smoking status: Former    Current packs/day: 0.00    Average packs/day: 0.5 packs/day for 37.0 years (18.5 ttl pk-yrs)    Types: Cigarettes    Start date: 11/19/1984    Quit date: 11/19/2021    Years since quitting: 2.7   Smokeless tobacco: Never   Tobacco comments:    Smokes 1/2 pack per day 07/17/21 [facility]  Vaping Use   Vaping status: Never Used  Substance and Sexual Activity   Alcohol use: Not Currently   Drug use: No   Sexual activity: Not on file  Other Topics Concern   Not on file  Social History Narrative   Not on file   Social Drivers of Health   Financial Resource Strain: Low Risk  (01/08/2023)   Received from [facility]   Overall Financial Resource Strain (CARDIA)    Difficulty of Paying Living Expenses: Not hard at all  Food Insecurity:  Food Insecurity Present (09/01/2024)   Hunger Vital Sign    Worried About Running Out of Food in the Last Year: Sometimes true    Ran Out of Food in the Last Year: Sometimes true  Transportation Needs: No Transportation Needs (09/01/2024)   PRAPARE - Administrator, Civil Service (Medical): No    Lack of Transportation (Non-Medical): No  Physical Activity: Inactive (01/08/2023)   Received from [facility]   Exercise Vital Sign    On average, how many days per week do you engage in moderate to strenuous exercise (like a brisk walk)?: 0 days    On average, how many minutes do you engage in exercise at this level?: 0 min  Stress: No Stress Concern Present (01/08/2023)   Received from [facility] - Occupational Stress Questionnaire    Feeling of Stress : Not at all  Social Connections: Socially Isolated (01/08/2023)   Received from [facility]   Social Connection and Isolation Panel    In a typical week, how many times do you talk on the phone with family, friends, or neighbors?: More than three times a week    How often do you get together with friends or relatives?: More than three times a week    How often  do you attend church or religious services?: Never    Do you belong to any clubs or organizations such as church groups, unions, fraternal or athletic groups, or school groups?: No    How often do you attend meetings of the clubs or organizations you belong to?: Never    Are you married, widowed, divorced, separated, never married, or living with a partner?: Divorced  Intimate Partner Violence: Not At Risk (09/01/2024)   Humiliation, Afraid, Rape, and Kick questionnaire    Fear of Current or Ex-Partner: No    Emotionally Abused: No    Physically Abused: No    Sexually Abused: No    FAMILY HISTORY: Family History  Problem Relation Age of Onset   Cancer Mother    Leukemia Father    CAD Brother     ALLERGIES:  has no  known allergies.  MEDICATIONS:  Current Facility-Administered Medications  Medication Dose Route Frequency Provider Last Rate Last Admin   albuterol  (PROVENTIL ) (2.5 MG/3ML) 0.083% nebulizer solution 2.5 mg  2.5 mg Nebulization Q6H PRN Adefeso, Oladapo, DO   2.5 mg at 09/01/24 1029   budesonide -glycopyrrolate-formoterol  (BREZTRI ) 160-9-4.8 MCG/ACT inhaler 2 puff  2 puff Inhalation BID Adefeso, Oladapo, DO   2 puff at 09/01/24 0928   carvedilol  (COREG ) tablet 3.125 mg  3.125 mg Oral BID WC Adefeso, Oladapo, DO   3.125 mg at 09/01/24 0932   HYDROmorphone (DILAUDID) injection 0.5 mg  0.5 mg Intravenous Q3H PRN Adefeso, Oladapo, DO   0.5 mg at 09/01/24 0316   [START ON 09/02/2024] influenza vac split trivalent PF (FLUZONE) injection 0.5 mL  0.5 mL Intramuscular Tomorrow-1000 Ricky Fines, MD       ondansetron  (ZOFRAN ) tablet 4 mg  4 mg Oral Q6H PRN Adefeso, Oladapo, DO       Or   ondansetron  (ZOFRAN ) injection 4 mg  4 mg Intravenous Q6H PRN Adefeso, Oladapo, DO       piperacillin -tazobactam (ZOSYN ) IVPB 3.375 g  3.375 g Intravenous Q8H Adefeso, Oladapo, DO 12.5 mL/hr at 09/01/24 0643 3.375 g at 09/01/24 0643   pravastatin (PRAVACHOL) tablet 20 mg  20 mg Oral Daily Adefeso, Oladapo, DO   20 mg at 09/01/24 0931    REVIEW OF SYSTEMS:   Review of Systems  Constitutional:  Positive for malaise/fatigue.  Gastrointestinal:  Positive for abdominal pain, nausea and vomiting.     PHYSICAL EXAMINATION: ECOG PERFORMANCE STATUS: 1 - Symptomatic but completely ambulatory  Vitals:   09/01/24 0932 09/01/24 1029  BP: (!) 155/71   Pulse: 94   Resp:    Temp:    SpO2:  95%   Filed Weights   08/31/24 1554  Weight: 213 lb 13.5 oz (97 kg)    GENERAL:alert, no distress and comfortable SKIN: skin color, texture, turgor are normal, no rashes or significant lesions EYES: normal, conjunctiva are pink and non-injected, sclera clear OROPHARYNX:no exudate, no erythema and lips, buccal mucosa, and tongue  normal  NECK: supple, thyroid normal size, non-tender, without nodularity LYMPH:  no palpable lymphadenopathy in the cervical, axillary or inguinal LUNGS: clear to auscultation and percussion with normal breathing effort HEART: regular rate & rhythm and no murmurs and no lower extremity edema ABDOMEN:abdomen soft, non-tender and normal bowel sounds Musculoskeletal:no cyanosis of digits and no clubbing  PSYCH: alert & oriented x 3 with fluent speech NEURO: no focal motor/sensory deficits  LABORATORY DATA:  I have reviewed the data as listed Recent Results (from the past 2160 hours)  Basic metabolic panel  Status: Abnormal   Collection Time: 08/31/24  4:40 PM  Result Value Ref Range   Sodium 141 135 - 145 mmol/L   Potassium 4.5 3.5 - 5.1 mmol/L    Comment: HEMOLYSIS AT THIS LEVEL MAY AFFECT RESULT   Chloride 99 98 - 111 mmol/L   CO2 30 22 - 32 mmol/L   Glucose, Bld 115 (H) 70 - 99 mg/dL    Comment: Glucose reference range applies only to samples taken after fasting for at least 8 hours.   BUN 15 8 - 23 mg/dL   Creatinine, Ser 9.32 0.44 - 1.00 mg/dL   Calcium 89.9 8.9 - 89.6 mg/dL   GFR, Estimated >39 >39 mL/min    Comment: (NOTE) Calculated using the CKD-EPI Creatinine Equation (2021)    Anion gap 11 5 - 15    Comment: Performed at [facility], [address], [place], [place] [address]  Troponin T, High Sensitivity     Status: None   Collection Time: 08/31/24  4:40 PM  Result Value Ref Range   Troponin T High Sensitivity <15 0 - 19 ng/L    Comment: (NOTE) Biotin concentrations > 1000 ng/mL falsely decrease TnT results.  Serial cardiac troponin measurements are suggested.  Refer to the Links section for chest pain algorithms and additional  guidance. Performed at [facility], [address] [place], [place] [address]   Lipase, blood     Status: None   Collection Time: 08/31/24  4:40 PM  Result Value Ref Range   Lipase 23 11 - 51 U/L    Comment: Performed at [facility], [address] [place], [place] [address]  Magnesium      Status: None   Collection Time: 08/31/24  4:40 PM  Result Value Ref Range   Magnesium  2.2 1.7 - 2.4 mg/dL    Comment: Performed at [facility], [address] [place], [place] [address]  Hepatic function panel     Status: Abnormal   Collection Time: 08/31/24  4:40 PM  Result Value Ref Range   Total Protein 6.6 6.5 - 8.1 g/dL   Albumin 4.0 3.5 - 5.0 g/dL   AST 865 (H) 15 - 41 U/L    Comment: HEMOLYSIS AT THIS LEVEL MAY AFFECT RESULT   ALT 59 (H) 0 - 44 U/L   Alkaline Phosphatase 139 (H) 38 - 126 U/L   Total Bilirubin 0.7 0.0 - 1.2 mg/dL   Bilirubin, Direct 0.2 0.0 - 0.2 mg/dL    Comment: HEMOLYSIS AT THIS LEVEL MAY AFFECT RESULT   Indirect Bilirubin 0.5 0.3 - 0.9 mg/dL    Comment: Performed at [facility], [address] [place], [place] [address]  CBC with Differential/Platelet     Status: Abnormal   Collection Time: 08/31/24  4:40 PM  Result Value Ref Range   WBC 51.5 (HH) 4.0 - 10.5 K/uL    Comment: REPEATED TO VERIFY This critical result has been called to A DANNER by Kerri Barrack on 08/31/2024 17:31:55, and has been read back.    RBC 4.04 3.87 - 5.11 MIL/uL   Hemoglobin 12.6 12.0 - 15.0 g/dL   HCT 59.5 63.9 - 53.9 %   MCV 100.0 80.0 - 100.0 fL   MCH 31.2 26.0 - 34.0 pg   MCHC 31.2 30.0 - 36.0 g/dL   RDW 87.3 88.4 - 84.4 %   Platelets 188 150 - 400 K/uL   nRBC 0.0 0.0 - 0.2 %   Neutrophils Relative % 18 %  Neutro Abs 9.3 (H) 1.7 - 7.7 K/uL   Lymphocytes Relative 81 %   Lymphs Abs 41.7 (H) 0.7 - 4.0 K/uL   Monocytes Relative 1 %   Monocytes Absolute 0.5 0.1 - 1.0 K/uL   Eosinophils Relative 0 %   Eosinophils Absolute 0.0 0.0 - 0.5 K/uL   Basophils Relative 0 %   Basophils Absolute 0.0 0.0 - 0.1 K/uL   WBC Morphology SMUDGE CELLS PRESENT     Comment: REACTIVE LYMPHOCYTES PRESENT   RBC Morphology MORPHOLOGY UNREMARKABLE    Smear Review Normal platelet morphology    Reactive, Benign Lymphocytes  PRESENT     Comment: Performed at [facility], [address] [place], [place] [address]  Troponin T, High Sensitivity     Status: None   Collection Time: 08/31/24  6:51 PM  Result Value Ref Range   Troponin T High Sensitivity <15 0 - 19 ng/L    Comment: (NOTE) Biotin concentrations > 1000 ng/mL falsely decrease TnT results.  Serial cardiac troponin measurements are suggested.  Refer to the Links section for chest pain algorithms and additional  guidance. Performed at [facility], [address] [place], [place] [address]   Lactate dehydrogenase     Status: Abnormal   Collection Time: 08/31/24  7:56 PM  Result Value Ref Range   LDH 264 (H) 98 - 192 U/L    Comment: Performed at [facility], [address], [place], [place] [address]  Sedimentation rate     Status: None   Collection Time: 08/31/24  7:56 PM  Result Value Ref Range   Sed Rate NEGATIVE 0 - 30 mm/hr    Comment: Performed at [facility], [address], [place], [place] [address]  C-reactive protein     Status: None   Collection Time: 08/31/24  7:56 PM  Result Value Ref Range   CRP 0.5 <1.0 mg/dL    Comment: Performed at [facility], [address], [place], [place] [address]  Uric acid     Status: None   Collection Time: 08/31/24  7:56 PM  Result Value Ref Range   Uric Acid, Serum 4.6 2.5 - 7.1 mg/dL    Comment: Performed at [facility], [address] [place], [place] [address]  HIV Antibody (routine testing w rflx)     Status: None   Collection Time: 09/01/24  3:34 AM  Result Value Ref Range   HIV Screen 4th Generation wRfx Non Reactive Non Reactive    Comment: Performed at [facility], [address], [place], [place] [address]  Comprehensive metabolic panel     Status: Abnormal   Collection Time: 09/01/24  3:34 AM  Result Value Ref Range   Sodium 139 135 - 145 mmol/L   Potassium 4.1 3.5 - 5.1 mmol/L   Chloride 96 (L) 98 - 111 mmol/L   CO2 32 22 - 32 mmol/L   Glucose, Bld 97 70 - 99  mg/dL    Comment: Glucose reference range applies only to samples taken after fasting for at least 8 hours.   BUN 12 8 - 23 mg/dL   Creatinine, Ser 9.38 0.44 - 1.00 mg/dL   Calcium 9.7 8.9 - 89.6 mg/dL   Total Protein 6.4 (L) 6.5 - 8.1 g/dL   Albumin 4.0 3.5 - 5.0 g/dL   AST 883 (H) 15 - 41 U/L   ALT 122 (H) 0 - 44 U/L   Alkaline Phosphatase 142 (H) 38 - 126 U/L  Total Bilirubin 0.9 0.0 - 1.2 mg/dL   GFR, Estimated >39 >39 mL/min    Comment: (NOTE) Calculated using the CKD-EPI Creatinine Equation (2021)    Anion gap 11 5 - 15    Comment: Performed at [facility], [address], [place], [place] [address]  CBC     Status: Abnormal   Collection Time: 09/01/24  3:34 AM  Result Value Ref Range   WBC 43.5 (H) 4.0 - 10.5 K/uL   RBC 3.87 3.87 - 5.11 MIL/uL   Hemoglobin 12.0 12.0 - 15.0 g/dL   HCT 61.8 63.9 - 53.9 %   MCV 98.4 80.0 - 100.0 fL   MCH 31.0 26.0 - 34.0 pg   MCHC 31.5 30.0 - 36.0 g/dL   RDW 87.2 88.4 - 84.4 %   Platelets 181 150 - 400 K/uL   nRBC 0.0 0.0 - 0.2 %    Comment: Performed at [facility], [address], [place], [place] [address]  Magnesium      Status: None   Collection Time: 09/01/24  3:34 AM  Result Value Ref Range   Magnesium  2.1 1.7 - 2.4 mg/dL    Comment: Performed at [facility], [address], [place], [place] [address]  Phosphorus     Status: None   Collection Time: 09/01/24  3:34 AM  Result Value Ref Range   Phosphorus 3.4 2.5 - 4.6 mg/dL    Comment: Performed at [facility], [address] [place], [place] [address]    RADIOGRAPHIC STUDIES: I have personally reviewed the radiological images as listed and agreed with the findings in the report. US  Abdomen Limited Result Date: 09/01/2024 CLINICAL DATA:  RIGHT upper quadrant pain EXAM: ULTRASOUND ABDOMEN LIMITED RIGHT UPPER QUADRANT COMPARISON:  CT abdomen pelvis 08/31/2024 FINDINGS: Gallbladder: Gallstones: Multiple gallstones are seen measuring up to 2.7 cm Sludge: None Gallbladder Wall:  Thickened measuring up to 6 mm Pericholecystic fluid: None Sonographic Murphy's Sign: Unable to assess due to pain medications. Common bile duct: Diameter: 6 mm Liver: Parenchymal echogenicity: Within normal limits Contours: Normal Lesions: None Portal vein: Patent.  Hepatopetal flow Other: None. IMPRESSION: Cholelithiasis with gallbladder wall thickening is suspicious for acute cholecystitis. Electronically Signed   By: Aliene Lloyd M.D.   On: 09/01/2024 10:30   CT ABDOMEN PELVIS W CONTRAST Result Date: 08/31/2024 EXAM: CT ABDOMEN AND PELVIS WITH CONTRAST 08/31/2024 06:57:22 PM TECHNIQUE: CT of the abdomen and pelvis was performed with the administration of intravenous contrast, (iohexol  (OMNIPAQUE ) 300 MG/ML solution). Multiplanar reformatted images are provided for review. Automated exposure control, iterative reconstruction, and/or weight-based adjustment of the mA/kV was utilized to reduce the radiation dose to as low as reasonably achievable. COMPARISON: None available. CLINICAL HISTORY: Abdominal pain, acute, nonlocalized. Pt c/o upper abd pain x 3 hours. States he took 3 tums with no relief. FINDINGS: LOWER CHEST: No acute abnormality. LIVER: The liver is unremarkable. GALLBLADDER AND BILE DUCTS: The gallbladder is distended with multiple layering stones seen within the gallbladder neck at the origin of the cystic duct. There is superimposed pericholecystic edema, but no changes of acute on ruptured cholecystitis. SPLEEN: No acute abnormality. PANCREAS: No acute abnormality. 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 The left adrenal gland is unremarkable. KIDNEYS, URETERS AND BLADDER: No stones in the kidneys or ureters. No hydronephrosis. No perinephric or periureteral stranding. Circum aortic left renal vein. Urinary bladder is unremarkable. GI AND BOWEL: Stomach  demonstrates no acute abnormality. There  is no bowel obstruction. A small fat-containing umbilical hernia is noted. PERITONEUM AND RETROPERITONEUM: No ascites. No free air. VASCULATURE: Mild aortoiliac atherosclerotic calcification is present. The abdominal vasculature is otherwise unremarkable. LYMPH NODES: No lymphadenopathy. REPRODUCTIVE ORGANS: No acute abnormality. BONES AND SOFT TISSUES: Osseous structures are age appropriate. No acute bone abnormality. No lytic or blastic bone lesion. IMPRESSION: 1. Acute cholecystitis. Electronically signed by: Dorethia Molt MD 08/31/2024 07:22 PM EDT RP Workstation: [id number]   DG Chest Portable 1 View Result Date: 08/31/2024 CLINICAL DATA:  Upper abdominal pain EXAM: PORTABLE CHEST - 1 VIEW COMPARISON:  February 26, 2023 FINDINGS: Chronic coarsening of the pulmonary interstitium without overt pulmonary edema. No focal airspace consolidation, pleural effusion, or pneumothorax. Mild cardiomegaly. Aortic atherosclerosis. No acute fracture or destructive lesions. Multilevel thoracic osteophytosis. IMPRESSION: No acute cardiopulmonary abnormality. Electronically Signed   By: Rogelia Myers M.D.   On: 08/31/2024 17:01    ASSESSMENT & PLAN:  Lymphocytosis Significant elevation in white count greater than 50.  Peripheral smear shows smudge cells. Patient is actively being treated for cholecystitis with antibiotics. Discussed with ED physician adding flow cytometry, uric acid, LDH, ANA, rheumatoid factor, CRP and ESR. She denies any B symptoms. Previously patient has been seen for polycythemia and received phlebotomies. On chart review, it looks like she has had an elevated white count for at least a year. We are still awaiting additional labs including flow cytometry suspicion CLL. Discussed with patient outpatient follow-up with us  in our clinic in approximately 3 to 4 weeks.  Will get that scheduled for her.    Cholecystitis Presented to [facility] yesterday for abdominal pain, nausea and  vomiting.  Imaging revealed cholecystitis.  She is currently admitted and receiving antibiotics.  Ultrasound this morning did show gallbladder thickening consistent with cholecystitis. Reports she is feeling much better today.  PLAN: Continue plan of care for cholecystitis. We will continue to follow but clinical suspicion for CLL. Will get patient set up to see Dr. Davonna in 3 to 4 weeks.     All questions were answered. The patient knows to call the clinic with any problems, questions or concerns. I spent 25 minutes counseling the patient face to face. The total time spent in the appointment was 15 minutes and more than 50% was on counseling.     Delon FORBES Hope, NP 09/01/24 1:00 PM

## 2024-09-01 NOTE — Progress Notes (Signed)
 Progress Note   Patient: Emily Fitzgerald FMW:[id number] DOB: Oct 01, 1961 DOA: 08/31/2024     1 DOS: the patient was seen and examined on 09/01/2024   Brief hospital admission narrative: As per H&P written by Dr. Manfred on 08/31/2024 Emily Fitzgerald is a 63 y.o. female with medical history significant of COPD, tobacco abuse, chronic respiratory failure on supplemental oxygen  at 3 LPM of oxygen  at baseline, hyperlipidemia who presents to the emergency department due to abdominal pain which started today around 2 PM.  Patient states that she had scrambled eggs for lunch and shortly after this, she developed epigastric pain which was severe and associated with burping without nausea or vomiting.  Pain was constant, sharp in nature and rated as 7-8/10 on pain severity.  3 Tums taken provided no relief.she decided to go to the ED for further evaluation and management.   ED Course: In the emergency department, BP was 149/71, O2 sats was 95% on supplemental oxygen  at 3 LPM.  Other vital signs are within normal range.  Workup in the ED showed normal CBC except for WBC of 51.5 with lymphocyte predominant.  BMP was normal except for blood glucose of 115.  AST 134, ALT 59, ALP 139.  Magnesium  2.2, troponin x 2 was normal.  Lipase 23 CT abdomen pelvis with contrast was suggestive of acute cholecystitis Chest x-ray showed no acute cardiopulmonary abnormality GI cocktail was provided, patient was treated with Pepcid and Dilaudid.  IV Zosyn  was given. General surgery (Dr. Evonnie) was consulted and recommended admitting patient here at [facility], antibiotics, n.p.o. at midnight, right upper quadrant ultrasound in the morning with plan for IR consultation for consideration of PCT after further imaging per EDP Oncology (NP Burns) was consulted regarding patient's leukocytosis with lymphocytosis and patient was seen in consult per EDP [facility] was asked to admit patient.  Assessment and plan 1-acute cholecystitis - Continue  conservative management and supportive care - Maintain adequate hydration - Continue as needed analgesics - Continue antiemetics - Will continue IV antibiotics -After discussing with general surgery no plan for surgical intervention with concern for underlying CLL; IR has been consulted for cholecystostomy tube placement. - HIDA scan requested.  2-transaminitis - Combination of provide #1 and concern for CLL - Continue treatment as mentioned above - Appreciate assistance and recommendation by hematology/oncology service.  3-leukocytosis - WBCs over 50000 range at time of admission - Looking into patient's records significant elevation in her WBCs at baseline appreciated in the low teens to almost 20,000 for over a year ago. - Questionable leukemoid reaction versus CLL - Oncology service planning to continue outpatient evaluation and further workup - Continue treatment for acute cholecystitis process as mentioned above. - Patient reports history of primary family member (patient's father) with leukemia.  4-mixed hyperlipidemia - Planning to resume statin once improvement in LFTs appreciated.  5-history of COPD - No acute exacerbation currently appreciated - Continue breztri  and as needed bronchodilators.  6-chronic respiratory failure with hypoxia - Continue oxygen  supplementation - Saturation appreciated on 2-3 L nasal cannula supplementation (patient baseline).  7-chronic diastolic heart failure - Stable and compensated - Continue to follow daily weights/strict I's and O's - Continue treatment of hypertension - Low-sodium diet discussed with patient.  8-hypertension - Continue current antihypertensive agents.  9-Class 1 obesity - Low-calorie diet and portion control discussed with patient. -Body mass index is 34.52 kg/m.    Subjective:  Reporting still intermittent right upper quadrant discomfort (significantly improved) no nausea, no vomiting, no fever.  Physical  Exam: Vitals:   09/01/24 0929 09/01/24 0932 09/01/24 1029 09/01/24 1330  BP:  (!) 155/71  131/70  Pulse:  94  76  Resp:    16  Temp:    98.2 F (36.8 C)  TempSrc:    Oral  SpO2: 94%  95% 95%  Weight:      Height:       General exam: Alert, awake, oriented x 3; in no major distress. Respiratory system: Clear to auscultation. Respiratory effort normal. Cardiovascular system:RRR. No murmurs, rubs, gallops. Gastrointestinal system: Abdomen is obese, nondistended, soft and with very mild discomfort on deep palpation in her upper quadrant.  Positive bowel sounds appreciated on exam.   Central nervous system: Alert and oriented. No focal neurological deficits. Extremities: No cyanosis or clubbing. Skin: No petechiae. Psychiatry: Judgement and insight appear normal. Mood & affect appropriate.    Data Reviewed: Comprehensive metabolic panel: Sodium 139, potassium 4.1, chloride 96, bicarb 32, BUN 12, creatinine 0.61, AST 116, ALT 122, alk phos 142, total bilirubin 0.9 and GFR >60 RAR:TARd 43.5, hemoglobin 12.0 and platelet count 181K  Family Communication: No family at bedside.  Disposition: Status is: Inpatient Remains inpatient appropriate because: Continue IV therapy.  Anticipating discharge back home once medically stable.  Time spent: 50 minutes  Author: Eric Nunnery, MD 09/01/2024 3:52 PM  For on call review [email or web address].

## 2024-09-01 NOTE — Progress Notes (Addendum)
 Patients purse presented with multiple bugs that appear to be bed bugs coming out of it. Purse placed in three patient belonging bags and tied up. Bed completley stripped and linens changed. Patient showered. Bed bug collected and placed in specimen cup to be given to EVS. Dr. Ricky made aware, contact precautions continued.

## 2024-09-01 NOTE — Assessment & Plan Note (Addendum)
 Presented to [facility], ED yesterday for abdominal pain, nausea and vomiting.  Imaging revealed cholecystitis.  She is currently admitted and receiving antibiotics.  Ultrasound this morning did show gallbladder thickening consistent with cholecystitis. Reports she is feeling much better today.  PLAN: Continue plan of care for cholecystitis. We will continue to follow but clinical suspicion for CLL. Will get patient set up to see Dr. Davonna in 3 to 4 weeks.

## 2024-09-01 NOTE — Progress Notes (Signed)
  Transition of Care [facility]) Screening Note   Patient Details  Name: Emily Fitzgerald Date of Birth: 09/24/1961   Transition of Care [facility]) CM/SW Contact:    Hoy DELENA Bigness, LCSW Phone Number: 09/01/2024, 9:08 AM    Transition of Care Department [facility]) has reviewed patient and no TOC needs have been identified at this time. We will continue to monitor patient advancement through interdisciplinary progression rounds. If new patient transition needs arise, please place a TOC consult.    09/01/24 0907  TOC Brief Assessment  Insurance and Status Reviewed  Patient has primary care physician Yes  Home environment has been reviewed Single family home  Prior level of function: Independent  Prior/Current Home Services No current home services  Social Drivers of Health Review SDOH reviewed no interventions necessary  Readmission risk has been reviewed Yes  Transition of care needs no transition of care needs at this time

## 2024-09-01 NOTE — Progress Notes (Signed)
 IR Procedure Request - Cholecystomy tube placement  63 y.o. female inpatient ([facility]) History of COPD ( on 22),  HTN, CHF, polycythemia,  and vitamin D deficiency. Presented to the ED at [facility] on 10.6.25 with a sudden onset of adbominal pain. Found to have concern for cholecystitis. US  abd from today reads Cholelithiasis with gallbladder wall thickening is suspicious for acute cholecystitis. CT Abd pelvis from 10.5 reads Acute cholecystitis. WBC 43.5, AST 116, ALT 122, alkaline phosphatase 142. No anticoagulation.  Case reviewed by IR Attending Dr. Frederic Specking who recommends a HIDA scan for further evaluation. This was communicated to the Team via EPIC chat.

## 2024-09-01 NOTE — Consult Note (Addendum)
 [facility] Consult  Reason for Consult: acute cholecystitis Referring Physician: Dr. Melvenia  Chief Complaint   Abdominal Pain     HPI: Emily Fitzgerald is a 63 y.o. female who presented to the hospital with a several hour history of epigastric abdominal pain with nausea.  She states that she ate scrambled eggs with butter for lunch and started having pain thereafter.  She denies any vomiting.  She took Tums without relief of her pain.  She has had some intermittent episodes of reflux in the past, but denies any significant postprandial abdominal pain.  Her past medical history significant for COPD on 3 L nasal cannula.  Her past surgical history significant for C-section.  She denies use of blood thinning medications.  She has a family history of leukemia in her father, but she is unsure of the type of leukemia.  In the emergency department, she was noted to have a significant leukocytosis of 51.5 which was noted to be a lymphocytosis.  She also has elevation in her alkaline phosphatase at 139, AST 134, and ALT 59.  T. bili is normal.  She underwent a CT of the abdomen and pelvis which demonstrated cholelithiasis with wall thickening and pericholecystic edema concerning for acute cholecystitis.  This morning, she states that she is feeling well.  She denies any abdominal pain.  She is asking for a diet.  Past Medical History:  Diagnosis Date   COPD (chronic obstructive pulmonary disease) (HCC)    Influenza B 12/2017   Polycythemia    Seasonal allergies    Vitamin D deficiency     Past Surgical History:  Procedure Laterality Date   CESAREAN SECTION      Family History  Problem Relation Age of Onset   Cancer Mother    Leukemia Father    CAD Brother     Social History   Tobacco Use   Smoking status: Former    Current packs/day: 0.00    Average packs/day: 0.5 packs/day for 37.0 years (18.5 ttl pk-yrs)    Types: Cigarettes    Start date: 11/19/1984    Quit date:  11/19/2021    Years since quitting: 2.7   Smokeless tobacco: Never   Tobacco comments:    Smokes 1/2 pack per day 07/17/21 [facility]  Vaping Use   Vaping status: Never Used  Substance Use Topics   Alcohol use: Not Currently   Drug use: No    Medications: I have reviewed the patient's current medications.  No Known Allergies   ROS:  Pertinent items are noted in HPI.  Blood pressure (!) 155/71, pulse 94, temperature 98 F (36.7 C), temperature source Oral, resp. rate 16, height 5' 6 (1.676 m), weight 97 kg, SpO2 95%. Physical Exam Vitals reviewed.  Constitutional:      Appearance: She is well-developed.  HENT:     Head: Normocephalic and atraumatic.  Eyes:     Extraocular Movements: Extraocular movements intact.     Pupils: Pupils are equal, round, and reactive to light.  Cardiovascular:     Rate and Rhythm: Normal rate.  Pulmonary:     Effort: Pulmonary effort is normal.  Abdominal:     Comments: Abdomen soft, nondistended, no percussion tenderness, nontender to palpation; no rigidity, guarding, rebound tenderness; negative Murphy sign  Skin:    General: Skin is warm and dry.  Neurological:     General: No focal deficit present.     Mental Status: She is alert and oriented to person, place,  and time.  Psychiatric:        Mood and Affect: Mood normal.        Behavior: Behavior normal.     Results: Results for orders placed or performed during the hospital encounter of 08/31/24 (from the past 48 hours)  Basic metabolic panel     Status: Abnormal   Collection Time: 08/31/24  4:40 PM  Result Value Ref Range   Sodium 141 135 - 145 mmol/L   Potassium 4.5 3.5 - 5.1 mmol/L    Comment: HEMOLYSIS AT THIS LEVEL MAY AFFECT RESULT   Chloride 99 98 - 111 mmol/L   CO2 30 22 - 32 mmol/L   Glucose, Bld 115 (H) 70 - 99 mg/dL    Comment: Glucose reference range applies only to samples taken after fasting for at least 8 hours.   BUN 15 8 - 23 mg/dL   Creatinine, Ser 9.32 0.44 - 1.00  mg/dL   Calcium 89.9 8.9 - 89.6 mg/dL   GFR, Estimated >39 >39 mL/min    Comment: (NOTE) Calculated using the CKD-EPI Creatinine Equation (2021)    Anion gap 11 5 - 15    Comment: Performed at [facility], [address] [place], [place] [address]  Troponin T, High Sensitivity     Status: None   Collection Time: 08/31/24  4:40 PM  Result Value Ref Range   Troponin T High Sensitivity <15 0 - 19 ng/L    Comment: (NOTE) Biotin concentrations > 1000 ng/mL falsely decrease TnT results.  Serial cardiac troponin measurements are suggested.  Refer to the Links section for chest pain algorithms and additional  guidance. Performed at [facility], [address], [place], [place] [address]   Lipase, blood     Status: None   Collection Time: 08/31/24  4:40 PM  Result Value Ref Range   Lipase 23 11 - 51 U/L    Comment: Performed at [facility], [address], [place], [place] [address]  Magnesium      Status: None   Collection Time: 08/31/24  4:40 PM  Result Value Ref Range   Magnesium  2.2 1.7 - 2.4 mg/dL    Comment: Performed at [facility], [address], [place], [place] [address]  Hepatic function panel     Status: Abnormal   Collection Time: 08/31/24  4:40 PM  Result Value Ref Range   Total Protein 6.6 6.5 - 8.1 g/dL   Albumin 4.0 3.5 - 5.0 g/dL   AST 865 (H) 15 - 41 U/L    Comment: HEMOLYSIS AT THIS LEVEL MAY AFFECT RESULT   ALT 59 (H) 0 - 44 U/L   Alkaline Phosphatase 139 (H) 38 - 126 U/L   Total Bilirubin 0.7 0.0 - 1.2 mg/dL   Bilirubin, Direct 0.2 0.0 - 0.2 mg/dL    Comment: HEMOLYSIS AT THIS LEVEL MAY AFFECT RESULT   Indirect Bilirubin 0.5 0.3 - 0.9 mg/dL    Comment: Performed at [facility], [address] [place], [place] [address]  CBC with Differential/Platelet     Status: Abnormal   Collection Time: 08/31/24  4:40 PM  Result Value Ref Range   WBC 51.5 (HH) 4.0 - 10.5 K/uL    Comment: REPEATED TO VERIFY This critical result has been called to A DANNER by Kerri Barrack on 08/31/2024 17:31:55, and has been read back.    RBC 4.04 3.87 - 5.11 MIL/uL   Hemoglobin 12.6 12.0 - 15.0 g/dL   HCT 59.5 63.9 - 53.9 %  MCV 100.0 80.0 - 100.0 fL   MCH 31.2 26.0 - 34.0 pg   MCHC 31.2 30.0 - 36.0 g/dL   RDW 87.3 88.4 - 84.4 %   Platelets 188 150 - 400 K/uL   nRBC 0.0 0.0 - 0.2 %   Neutrophils Relative % 18 %   Neutro Abs 9.3 (H) 1.7 - 7.7 K/uL   Lymphocytes Relative 81 %   Lymphs Abs 41.7 (H) 0.7 - 4.0 K/uL   Monocytes Relative 1 %   Monocytes Absolute 0.5 0.1 - 1.0 K/uL   Eosinophils Relative 0 %   Eosinophils Absolute 0.0 0.0 - 0.5 K/uL   Basophils Relative 0 %   Basophils Absolute 0.0 0.0 - 0.1 K/uL   WBC Morphology SMUDGE CELLS PRESENT     Comment: REACTIVE LYMPHOCYTES PRESENT   RBC Morphology MORPHOLOGY UNREMARKABLE    Smear Review Normal platelet morphology    Reactive, Benign Lymphocytes PRESENT     Comment: Performed at [facility] [address] [place], [place] [address]  Troponin T, High Sensitivity     Status: None   Collection Time: 08/31/24  6:51 PM  Result Value Ref Range   Troponin T High Sensitivity <15 0 - 19 ng/L    Comment: (NOTE) Biotin concentrations > 1000 ng/mL falsely decrease TnT results.  Serial cardiac troponin measurements are suggested.  Refer to the Links section for chest pain algorithms and additional  guidance. Performed at [facility], [address] [place], [place] [address]   Lactate dehydrogenase     Status: Abnormal   Collection Time: 08/31/24  7:56 PM  Result Value Ref Range   LDH 264 (H) 98 - 192 U/L    Comment: Performed at [facility], [address], [place], [place] [address]  Sedimentation rate     Status: None   Collection Time: 08/31/24  7:56 PM  Result Value Ref Range   Sed Rate NEGATIVE 0 - 30 mm/hr    Comment: Performed at [facility], [address] [place], [place] [address]  C-reactive protein     Status: None   Collection Time: 08/31/24  7:56 PM  Result Value Ref Range   CRP 0.5  <1.0 mg/dL    Comment: Performed at [facility], [address], [place], [place] [address]  Uric acid     Status: None   Collection Time: 08/31/24  7:56 PM  Result Value Ref Range   Uric Acid, Serum 4.6 2.5 - 7.1 mg/dL    Comment: Performed at [facility], [address] [place], [place] [address]  HIV Antibody (routine testing w rflx)     Status: None   Collection Time: 09/01/24  3:34 AM  Result Value Ref Range   HIV Screen 4th Generation wRfx Non Reactive Non Reactive    Comment: Performed at [facility], [address], [place], [place] [address]  Comprehensive metabolic panel     Status: Abnormal   Collection Time: 09/01/24  3:34 AM  Result Value Ref Range   Sodium 139 135 - 145 mmol/L   Potassium 4.1 3.5 - 5.1 mmol/L   Chloride 96 (L) 98 - 111 mmol/L   CO2 32 22 - 32 mmol/L   Glucose, Bld 97 70 - 99 mg/dL    Comment: Glucose reference range applies only to samples taken after fasting for at least 8 hours.   BUN 12 8 - 23 mg/dL   Creatinine, Ser 9.38 0.44 - 1.00 mg/dL  Calcium 9.7 8.9 - 10.3 mg/dL   Total Protein 6.4 (L) 6.5 - 8.1 g/dL   Albumin 4.0 3.5 - 5.0 g/dL   AST 883 (H) 15 - 41 U/L   ALT 122 (H) 0 - 44 U/L   Alkaline Phosphatase 142 (H) 38 - 126 U/L   Total Bilirubin 0.9 0.0 - 1.2 mg/dL   GFR, Estimated >39 >39 mL/min    Comment: (NOTE) Calculated using the CKD-EPI Creatinine Equation (2021)    Anion gap 11 5 - 15    Comment: Performed at [facility], [address], [place], [place] [address]  CBC     Status: Abnormal   Collection Time: 09/01/24  3:34 AM  Result Value Ref Range   WBC 43.5 (H) 4.0 - 10.5 K/uL   RBC 3.87 3.87 - 5.11 MIL/uL   Hemoglobin 12.0 12.0 - 15.0 g/dL   HCT 61.8 63.9 - 53.9 %   MCV 98.4 80.0 - 100.0 fL   MCH 31.0 26.0 - 34.0 pg   MCHC 31.5 30.0 - 36.0 g/dL   RDW 87.2 88.4 - 84.4 %   Platelets 181 150 - 400 K/uL   nRBC 0.0 0.0 - 0.2 %    Comment: Performed at [facility], [address] [place], [place]  [address]  Magnesium      Status: None   Collection Time: 09/01/24  3:34 AM  Result Value Ref Range   Magnesium  2.1 1.7 - 2.4 mg/dL    Comment: Performed at [facility], [address], [place], [place] [address]  Phosphorus     Status: None   Collection Time: 09/01/24  3:34 AM  Result Value Ref Range   Phosphorus 3.4 2.5 - 4.6 mg/dL    Comment: Performed at [facility], [address], [place], [place] [address]    US  Abdomen Limited Result Date: 09/01/2024 CLINICAL DATA:  RIGHT upper quadrant pain EXAM: ULTRASOUND ABDOMEN LIMITED RIGHT UPPER QUADRANT COMPARISON:  CT abdomen pelvis 08/31/2024 FINDINGS: Gallbladder: Gallstones: Multiple gallstones are seen measuring up to 2.7 cm Sludge: None Gallbladder Wall: Thickened measuring up to 6 mm Pericholecystic fluid: None Sonographic Murphy's Sign: Unable to assess due to pain medications. Common bile duct: Diameter: 6 mm Liver: Parenchymal echogenicity: Within normal limits Contours: Normal Lesions: None Portal vein: Patent.  Hepatopetal flow Other: None. IMPRESSION: Cholelithiasis with gallbladder wall thickening is suspicious for acute cholecystitis. Electronically Signed   By: Aliene Lloyd M.D.   On: 09/01/2024 10:30   CT ABDOMEN PELVIS W CONTRAST Result Date: 08/31/2024 EXAM: CT ABDOMEN AND PELVIS WITH CONTRAST 08/31/2024 06:57:22 PM TECHNIQUE: CT of the abdomen and pelvis was performed with the administration of intravenous contrast, (iohexol  (OMNIPAQUE ) 300 MG/ML solution). Multiplanar reformatted images are provided for review. Automated exposure control, iterative reconstruction, and/or weight-based adjustment of the mA/kV was utilized to reduce the radiation dose to as low as reasonably achievable. COMPARISON: None available. CLINICAL HISTORY: Abdominal pain, acute, nonlocalized. Pt c/o upper abd pain x 3 hours. States he took 3 tums with no relief. FINDINGS: LOWER CHEST: No acute abnormality. LIVER: The liver is unremarkable. GALLBLADDER AND  BILE DUCTS: The gallbladder is distended with multiple layering stones seen within the gallbladder neck at the origin of the cystic duct. There is superimposed pericholecystic edema, but no changes of acute on ruptured cholecystitis. SPLEEN: No acute abnormality. PANCREAS: No acute abnormality. 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 The  left adrenal gland is unremarkable. KIDNEYS, URETERS AND BLADDER: No stones in the kidneys or ureters. No hydronephrosis. No perinephric or periureteral stranding. Circum aortic left renal vein. Urinary bladder is unremarkable. GI AND BOWEL: Stomach demonstrates no acute abnormality. There is no bowel obstruction. A small fat-containing umbilical hernia is noted. PERITONEUM AND RETROPERITONEUM: No ascites. No free air. VASCULATURE: Mild aortoiliac atherosclerotic calcification is present. The abdominal vasculature is otherwise unremarkable. LYMPH NODES: No lymphadenopathy. REPRODUCTIVE ORGANS: No acute abnormality. BONES AND SOFT TISSUES: Osseous structures are age appropriate. No acute bone abnormality. No lytic or blastic bone lesion. IMPRESSION: 1. Acute cholecystitis. Electronically signed by: Dorethia Molt MD 08/31/2024 07:22 PM EDT RP Workstation: [id number]   DG Chest Portable 1 View Result Date: 08/31/2024 CLINICAL DATA:  Upper abdominal pain EXAM: PORTABLE CHEST - 1 VIEW COMPARISON:  February 26, 2023 FINDINGS: Chronic coarsening of the pulmonary interstitium without overt pulmonary edema. No focal airspace consolidation, pleural effusion, or pneumothorax. Mild cardiomegaly. Aortic atherosclerosis. No acute fracture or destructive lesions. Multilevel thoracic osteophytosis. IMPRESSION: No acute cardiopulmonary abnormality. Electronically Signed   By: Rogelia Myers M.D.   On: 08/31/2024 17:01     Assessment & Plan:  Emily Fitzgerald is a 63 y.o. female who was admitted with  concern for acute cholecystitis.  Imaging and blood work evaluated by myself.  -Discussed gallbladder pathophysiology with the patient, and her current imaging findings.  I explained that she underwent an abdominal ultrasound which also confirmed her CT findings for acute cholecystitis. -Given her new lymphocytosis with her baseline pulmonary status, I feel the patient is not a candidate for surgery currently.  She needs to be fully worked up for her lymphocytosis prior to surgical intervention.   -While the patient could have a leukemoid reaction resulting in the lymphocytosis, her WBC count has been persistently elevated even a year ago with elevation in her lymphocytes at that time -Recommend IR consultation for percutaneous cholecystostomy tube to control her biliary infection -Continue IV Zosyn  -Okay for diet today if no plans for IR intervention. NPO at midnight -Appreciate hospitalist recommendations  All questions were answered to the satisfaction of the patient.  Note: Portions of this report may have been transcribed using voice recognition software. Every effort has been made to ensure accuracy; however, inadvertent computerized transcription errors may still be present.   -- Dorothyann Brittle, DO [facility] [address] [place], [place] [address] [phone] (office)

## 2024-09-02 ENCOUNTER — Inpatient Hospital Stay (HOSPITAL_COMMUNITY)

## 2024-09-02 DIAGNOSIS — K819 Cholecystitis, unspecified: Secondary | ICD-10-CM | POA: Diagnosis not present

## 2024-09-02 DIAGNOSIS — J9611 Chronic respiratory failure with hypoxia: Secondary | ICD-10-CM | POA: Diagnosis not present

## 2024-09-02 DIAGNOSIS — I5032 Chronic diastolic (congestive) heart failure: Secondary | ICD-10-CM | POA: Diagnosis not present

## 2024-09-02 DIAGNOSIS — Z720 Tobacco use: Secondary | ICD-10-CM | POA: Diagnosis not present

## 2024-09-02 LAB — RHEUMATOID FACTOR: Rheumatoid fact SerPl-aCnc: 19.1 [IU]/mL — ABNORMAL HIGH (ref <14.0)

## 2024-09-02 LAB — SURGICAL PATHOLOGY

## 2024-09-02 MED ORDER — PANTOPRAZOLE SODIUM 40 MG PO TBEC
40.0000 mg | DELAYED_RELEASE_TABLET | Freq: Every day | ORAL | 1 refills | Status: AC
Start: 2024-09-02 — End: 2025-09-02

## 2024-09-02 MED ORDER — ALBUTEROL SULFATE (2.5 MG/3ML) 0.083% IN NEBU
2.5000 mg | INHALATION_SOLUTION | Freq: Two times a day (BID) | RESPIRATORY_TRACT | Status: DC
  Administered 2024-09-02: 2.5 mg via RESPIRATORY_TRACT
  Filled 2024-09-02: qty 3

## 2024-09-02 MED ORDER — TECHNETIUM TC 99M MEBROFENIN IV KIT
5.0000 | PACK | Freq: Once | INTRAVENOUS | Status: AC | PRN
  Administered 2024-09-02: 5 via INTRAVENOUS

## 2024-09-02 MED ORDER — AMOXICILLIN-POT CLAVULANATE 875-125 MG PO TABS: 1.0000 | ORAL_TABLET | Freq: Two times a day (BID) | ORAL | 0 refills | Status: AC

## 2024-09-02 MED ORDER — SODIUM CHLORIDE 0.9 % IV SOLN: INTRAVENOUS | Status: DC

## 2024-09-02 NOTE — Progress Notes (Signed)
 Patient off unit to radiology department for HYDA scan

## 2024-09-02 NOTE — Plan of Care (Signed)
   Problem: Education: Goal: Knowledge of General Education information will improve Description Including pain rating scale, medication(s)/side effects and non-pharmacologic comfort measures Outcome: Progressing   Problem: Health Behavior/Discharge Planning: Goal: Ability to manage health-related needs will improve Outcome: Progressing

## 2024-09-02 NOTE — Progress Notes (Signed)
 Interventional Radiology Brief Note:  Monya Kozakiewicz is a 63 year old female who presented with abdominal pain and leukocytosis.  US  and CT suggestive of acute cholecystitis, however HIDA requested and performed today shows prompt filling of the gall bladder with patent common bile and cystic ducts.  No plans for percutaneous cholecystostomy tube placement in IR at this time.   Ordering services made aware.   Please re-consult if IR can be of further assistance.   Doyle Kunath, MS RD PA-C

## 2024-09-02 NOTE — Discharge Summary (Signed)
 Physician Discharge Summary   Patient: Emily Fitzgerald MRN: [id number] DOB: Dec 10, 1960  Admit date:     08/31/2024  Discharge date: 09/02/24  Discharge Physician: Eric Nunnery   PCP: [facility]   Recommendations at discharge:  Repeat complete metabolic panel to follow electrolytes, LFTs and renal function Reassess blood pressure and adjust antihypertensive treatment as needed Please repeat CBC to follow WBCs trend/stability. Continue assisting patient with weight loss management.   Discharge Diagnoses: Principal Problem:   Cholecystitis Active Problems:   COPD (chronic obstructive pulmonary disease) (HCC)   Transaminasemia   Lymphocytosis   Tobacco abuse   Chronic diastolic CHF (congestive heart failure) (HCC)   Chronic respiratory failure with hypoxia (HCC)   Mixed hyperlipidemia  Brief hospital admission narrative: As per H&P written by Dr. Manfred on 08/31/2024 Emily Fitzgerald is a 63 y.o. female with medical history significant of COPD, tobacco abuse, chronic respiratory failure on supplemental oxygen  at 3 LPM of oxygen  at baseline, hyperlipidemia who presents to the emergency department due to abdominal pain which started today around 2 PM.  Patient states that she had scrambled eggs for lunch and shortly after this, she developed epigastric pain which was severe and associated with burping without nausea or vomiting.  Pain was constant, sharp in nature and rated as 7-8/10 on pain severity.  3 Tums taken provided no relief.she decided to go to the ED for further evaluation and management.   ED Course: In the emergency department, BP was 149/71, O2 sats was 95% on supplemental oxygen  at 3 LPM.  Other vital signs are within normal range.  Workup in the ED showed normal CBC except for WBC of 51.5 with lymphocyte predominant.  BMP was normal except for blood glucose of 115.  AST 134, ALT 59, ALP 139.  Magnesium  2.2, troponin x 2 was normal.  Lipase 23 CT abdomen  pelvis with contrast was suggestive of acute cholecystitis Chest x-ray showed no acute cardiopulmonary abnormality GI cocktail was provided, patient was treated with Pepcid and Dilaudid.  IV Zosyn  was given. General surgery (Dr. Evonnie) was consulted and recommended admitting patient here at [facility], antibiotics, n.p.o. at midnight, right upper quadrant ultrasound in the morning with plan for IR consultation for consideration of PCT after further imaging per EDP Oncology (NP Burns) was consulted regarding patient's leukocytosis with lymphocytosis and patient was seen in consult per EDP TRH was asked to admit patient.  Assessment and Plan: 1-acute cholecystitis - Continue conservative management and supportive care - Patient advised to maintain adequate hydration - Continue as needed analgesics - Will discharge on oral antibiotics and instructions for red flag return symptoms.  -After discussing with general surgery no plan for surgical intervention with concern for underlying CLL; patient was evaluated by IR and after reviewing results of HIDA scan demonstrating normal parameters and good EF, decision made for not need of percutaneous cholecystostomy tube at this time.   2-transaminitis - Combination of provide #1 and concern for CLL - Improving - Continue to follow LFTs and follow-up visit.   3-leukocytosis - WBCs over 50000 range at time of admission - Looking into patient's records significant elevation in her WBCs at baseline appreciated in the low teens to almost 20,000 for over a year ago. - Questionable leukemoid reaction versus CLL - Oncology service planning to continue outpatient evaluation and further workup after discharge. - Continue treatment for acute cholecystitis process as mentioned above. - Patient reports history of primary family member (patient's father) with leukemia.  4-mixed hyperlipidemia - Continue statin and current healthy diet.   5-history of COPD/chronic  respiratory failure with hypoxia. - No acute exacerbation currently appreciated - Continue breztri  and as needed bronchodilators. - Continue patient follow-up with pulmonology service. -Continue home oxygen  supplementation   6-chronic diastolic heart failure - Stable and compensated - Continue to follow daily weights/strict I's and O's - Continue treatment of hypertension - Low-sodium diet discussed with patient.   7-hypertension - Continue current antihypertensive agents.   8-Class 1 obesity - Low-calorie diet and portion control discussed with patient. -Body mass index is 34.52 kg/m.   9-GERD - Discharged on PPI.   Consultants: Oncology service, general surgery, interventional radiologist. Procedures performed: See below for x-ray reports. Disposition: Home Diet recommendation: Heart healthy/low calorie diet.  DISCHARGE MEDICATION: Allergies as of 09/02/2024   No Known Allergies      Medication List     TAKE these medications    albuterol  108 (90 Base) MCG/ACT inhaler Commonly known as: VENTOLIN  HFA Inhale 1-2 puffs into the lungs every 6 (six) hours as needed for wheezing or shortness of breath. What changed: how much to take   amoxicillin -clavulanate 875-125 MG tablet Commonly known as: AUGMENTIN  Take 1 tablet by mouth 2 (two) times daily for 7 days.   Breztri  Aerosphere 160-9-4.8 MCG/ACT Aero inhaler Generic drug: budesonide -glycopyrrolate-formoterol  Inhale 2 puffs into the lungs in the morning and at bedtime.   carvedilol  3.125 MG tablet Commonly known as: COREG  Take 1 tablet (3.125 mg total) by mouth 2 (two) times daily with a meal.   cetirizine 10 MG tablet Commonly known as: ZYRTEC Take 10 mg by mouth daily.   ergocalciferol 1.25 MG (50000 UT) capsule Commonly known as: VITAMIN D2 Take 50,000 Units by mouth every Sunday.   gabapentin  100 MG capsule Commonly known as: NEURONTIN  Take 100 mg by mouth 3 (three) times daily as needed (for pain).    ipratropium-albuterol  0.5-2.5 (3) MG/3ML Soln Commonly known as: DUONEB Take 3 mLs by nebulization in the morning, at noon, and at bedtime. What changed: when to take this   OXYGEN  Inhale 3-4 L into the lungs continuous.   pantoprazole  40 MG tablet Commonly known as: Protonix  Take 1 tablet (40 mg total) by mouth daily.   pravastatin 20 MG tablet Commonly known as: PRAVACHOL Take 20 mg by mouth daily.        Follow-up Information     [facility]. Schedule an appointment as soon as possible for a visit in 10 day(s).   Contact information: [address] [place] [address] [phone]         Davonna Siad, MD. Schedule an appointment as soon as possible for a visit in 4 week(s).   Specialty: Oncology Contact information: [address] [place] [address] [phone]                Discharge Exam: Filed Weights   08/31/24 1554  Weight: 97 kg   General exam: Alert, awake, oriented x 3; no fever, no abdominal pain, no nausea or vomiting.  Reports good tolerance of diet overnight.  Passing gas. Respiratory system: Good air movement bilaterally.  Good saturation on chronic supplementation.  No using accessory muscle. Cardiovascular system:RRR. No rubs or gallops. Gastrointestinal system: Abdomen is obese, nondistended, soft and nontender. No organomegaly or masses felt. Normal bowel sounds heard. Central nervous system: No focal neurological deficits. Extremities: No cyanosis or clubbing. Skin: No petechiae. Psychiatry: Judgement and insight appear normal. Mood &  affect appropriate.   Condition at discharge: Stable and improved.  The results of significant diagnostics from this hospitalization (including imaging, microbiology, ancillary and laboratory) are listed below for reference.   Imaging Studies: NM Hepatobiliary Liver Func Result Date: 09/02/2024 CLINICAL DATA:  Concern for cholecystitis by CT and ultrasound EXAM:  NUCLEAR MEDICINE HEPATOBILIARY IMAGING TECHNIQUE: Sequential images of the abdomen were obtained out to 60 minutes following intravenous administration of radiopharmaceutical. RADIOPHARMACEUTICALS:  5.0 mCi Tc-6m  Choletec IV COMPARISON:  08/31/2024, 09/01/2024 FINDINGS: Prompt uptake and biliary excretion of activity by the liver is seen. Gallbladder activity is visualized, consistent with patency of cystic duct. Biliary activity passes into small bowel, consistent with patent common bile duct. IMPRESSION: Normal hepatobiliary scan.  Patent common bile duct and cystic duct. Electronically Signed   By: CHRISTELLA.  Shick M.D.   On: 09/02/2024 14:12   US  Abdomen Limited Result Date: 09/01/2024 CLINICAL DATA:  RIGHT upper quadrant pain EXAM: ULTRASOUND ABDOMEN LIMITED RIGHT UPPER QUADRANT COMPARISON:  CT abdomen pelvis 08/31/2024 FINDINGS: Gallbladder: Gallstones: Multiple gallstones are seen measuring up to 2.7 cm Sludge: None Gallbladder Wall: Thickened measuring up to 6 mm Pericholecystic fluid: None Sonographic Murphy's Sign: Unable to assess due to pain medications. Common bile duct: Diameter: 6 mm Liver: Parenchymal echogenicity: Within normal limits Contours: Normal Lesions: None Portal vein: Patent.  Hepatopetal flow Other: None. IMPRESSION: Cholelithiasis with gallbladder wall thickening is suspicious for acute cholecystitis. Electronically Signed   By: Aliene Lloyd M.D.   On: 09/01/2024 10:30   CT ABDOMEN PELVIS W CONTRAST Result Date: 08/31/2024 EXAM: CT ABDOMEN AND PELVIS WITH CONTRAST 08/31/2024 06:57:22 PM TECHNIQUE: CT of the abdomen and pelvis was performed with the administration of intravenous contrast, 100mL (iohexol  (OMNIPAQUE ) 300 MG/ML solution). Multiplanar reformatted images are provided for review. Automated exposure control, iterative reconstruction, and/or weight-based adjustment of the mA/kV was utilized to reduce the radiation dose to as low as reasonably achievable. COMPARISON: None  available. CLINICAL HISTORY: Abdominal pain, acute, nonlocalized. Pt c/o upper abd pain x 3 hours. States he took 3 tums with no relief. FINDINGS: LOWER CHEST: No acute abnormality. LIVER: The liver is unremarkable. GALLBLADDER AND BILE DUCTS: The gallbladder is distended with multiple layering stones seen within the gallbladder neck at the origin of the cystic duct. There is superimposed pericholecystic edema, but no changes of acute on ruptured cholecystitis. SPLEEN: No acute abnormality. PANCREAS: No acute abnormality. 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 The left adrenal gland is unremarkable. KIDNEYS, URETERS AND BLADDER: No stones in the kidneys or ureters. No hydronephrosis. No perinephric or periureteral stranding. Circum aortic left renal vein. Urinary bladder is unremarkable. GI AND BOWEL: Stomach demonstrates no acute abnormality. There is no bowel obstruction. A small fat-containing umbilical hernia is noted. PERITONEUM AND RETROPERITONEUM: No ascites. No free air. VASCULATURE: Mild aortoiliac atherosclerotic calcification is present. The abdominal vasculature is otherwise unremarkable. LYMPH NODES: No lymphadenopathy. REPRODUCTIVE ORGANS: No acute abnormality. BONES AND SOFT TISSUES: Osseous structures are age appropriate. No acute bone abnormality. No lytic or blastic bone lesion. IMPRESSION: 1. Acute cholecystitis. Electronically signed by: Dorethia Molt MD 08/31/2024 07:22 PM EDT RP Workstation: [id number]   DG Chest Portable 1 View Result Date: 08/31/2024 CLINICAL DATA:  Upper abdominal pain EXAM: PORTABLE CHEST - 1 VIEW COMPARISON:  February 26, 2023 FINDINGS: Chronic coarsening of the pulmonary interstitium without overt pulmonary edema. No focal airspace consolidation, pleural effusion, or pneumothorax.  Mild cardiomegaly. Aortic atherosclerosis. No acute fracture or destructive lesions. Multilevel  thoracic osteophytosis. IMPRESSION: No acute cardiopulmonary abnormality. Electronically Signed   By: Rogelia Myers M.D.   On: 08/31/2024 17:01    Microbiology: Results for orders placed or performed during the hospital encounter of 02/26/23  SARS Coronavirus 2 by RT PCR (hospital order, performed in [facility] hospital lab) *cepheid single result test* Anterior Nasal Swab     Status: None   Collection Time: 02/26/23  8:34 PM   Specimen: Anterior Nasal Swab  Result Value Ref Range Status   SARS Coronavirus 2 by RT PCR NEGATIVE NEGATIVE Final    Comment: (NOTE) SARS-CoV-2 target nucleic acids are NOT DETECTED.  The SARS-CoV-2 RNA is generally detectable in upper and lower respiratory specimens during the acute phase of infection. The lowest concentration of SARS-CoV-2 viral copies this assay can detect is 250 copies / mL. A negative result does not preclude SARS-CoV-2 infection and should not be used as the sole basis for treatment or other patient management decisions.  A negative result may occur with improper specimen collection / handling, submission of specimen other than nasopharyngeal swab, presence of viral mutation(s) within the areas targeted by this assay, and inadequate number of viral copies (<250 copies / mL). A negative result must be combined with clinical observations, patient history, and epidemiological information.  Fact Sheet for Patients:   RoadLapTop.co.za  Fact Sheet for Healthcare Providers: http://kim-miller.com/  This test is not yet approved or  cleared by the [place]  FDA and has been authorized for detection and/or diagnosis of SARS-CoV-2 by FDA under an Emergency Use Authorization (EUA).  This EUA will remain in effect (meaning this test can be used) for the duration of the COVID-19 declaration under Section 564(b)(1) of the Act, 21 U.S.C. section 360bbb-3(b)(1), unless the authorization is  terminated or revoked sooner.  Performed at [facility], [address] [place], [place] [address]   Resp panel by RT-PCR (RSV, Flu A&B, Covid) Anterior Nasal Swab     Status: None   Collection Time: 02/26/23 11:02 PM   Specimen: Anterior Nasal Swab  Result Value Ref Range Status   SARS Coronavirus 2 by RT PCR NEGATIVE NEGATIVE Final    Comment: (NOTE) SARS-CoV-2 target nucleic acids are NOT DETECTED.  The SARS-CoV-2 RNA is generally detectable in upper respiratory specimens during the acute phase of infection. The lowest concentration of SARS-CoV-2 viral copies this assay can detect is 138 copies/mL. A negative result does not preclude SARS-Cov-2 infection and should not be used as the sole basis for treatment or other patient management decisions. A negative result may occur with  improper specimen collection/handling, submission of specimen other than nasopharyngeal swab, presence of viral mutation(s) within the areas targeted by this assay, and inadequate number of viral copies(<138 copies/mL). A negative result must be combined with clinical observations, patient history, and epidemiological information. The expected result is Negative.  Fact Sheet for Patients:  BloggerCourse.com  Fact Sheet for Healthcare Providers:  SeriousBroker.it  This test is no t yet approved or cleared by the [place]  FDA and  has been authorized for detection and/or diagnosis of SARS-CoV-2 by FDA under an Emergency Use Authorization (EUA). This EUA will remain  in effect (meaning this test can be used) for the duration of the COVID-19 declaration under Section 564(b)(1) of the Act, 21 U.S.C.section 360bbb-3(b)(1), unless the authorization is terminated  or revoked sooner.       Influenza A by PCR NEGATIVE  NEGATIVE Final   Influenza B by PCR NEGATIVE NEGATIVE Final    Comment: (NOTE) The Xpert Xpress SARS-CoV-2/FLU/RSV plus assay is  intended as an aid in the diagnosis of influenza from Nasopharyngeal swab specimens and should not be used as a sole basis for treatment. Nasal washings and aspirates are unacceptable for Xpert Xpress SARS-CoV-2/FLU/RSV testing.  Fact Sheet for Patients: BloggerCourse.com  Fact Sheet for Healthcare Providers: SeriousBroker.it  This test is not yet approved or cleared by the [place]  FDA and has been authorized for detection and/or diagnosis of SARS-CoV-2 by FDA under an Emergency Use Authorization (EUA). This EUA will remain in effect (meaning this test can be used) for the duration of the COVID-19 declaration under Section 564(b)(1) of the Act, 21 U.S.C. section 360bbb-3(b)(1), unless the authorization is terminated or revoked.     Resp Syncytial Virus by PCR NEGATIVE NEGATIVE Final    Comment: (NOTE) Fact Sheet for Patients: BloggerCourse.com  Fact Sheet for Healthcare Providers: SeriousBroker.it  This test is not yet approved or cleared by the [place]  FDA and has been authorized for detection and/or diagnosis of SARS-CoV-2 by FDA under an Emergency Use Authorization (EUA). This EUA will remain in effect (meaning this test can be used) for the duration of the COVID-19 declaration under Section 564(b)(1) of the Act, 21 U.S.C. section 360bbb-3(b)(1), unless the authorization is terminated or revoked.  Performed at [facility], [address] [place], [place] [address]     Labs: CBC: Recent Labs  Lab 08/31/24 1640 09/01/24 0334  WBC 51.5* 43.5*  NEUTROABS 9.3*  --   HGB 12.6 12.0  HCT 40.4 38.1  MCV 100.0 98.4  PLT 188 181   Basic Metabolic Panel: Recent Labs  Lab 08/31/24 1640 09/01/24 0334  NA 141 139  K 4.5 4.1  CL 99 96*  CO2 30 32  GLUCOSE 115* 97  BUN 15 12  CREATININE 0.67 0.61  CALCIUM 10.0 9.7  MG 2.2 2.1  PHOS  --  3.4   Liver  Function Tests: Recent Labs  Lab 08/31/24 1640 09/01/24 0334  AST 134* 116*  ALT 59* 122*  ALKPHOS 139* 142*  BILITOT 0.7 0.9  PROT 6.6 6.4*  ALBUMIN 4.0 4.0   CBG: No results for input(s): GLUCAP in the last 168 hours.  Discharge time spent:  35 minutes.  Signed: Eric Nunnery, MD [facility] 09/02/2024

## 2024-09-02 NOTE — Progress Notes (Signed)
 Progress Note   Patient: Emily Fitzgerald FMW:[id number] DOB: 04-20-1961 DOA: 08/31/2024     2 DOS: the patient was seen and examined on 09/02/2024   Brief hospital admission narrative: As per H&P written by Dr. Manfred on 08/31/2024 Emily Fitzgerald is a 63 y.o. female with medical history significant of COPD, tobacco abuse, chronic respiratory failure on supplemental oxygen  at 3 LPM of oxygen  at baseline, hyperlipidemia who presents to the emergency department due to abdominal pain which started today around 2 PM.  Patient states that she had scrambled eggs for lunch and shortly after this, she developed epigastric pain which was severe and associated with burping without nausea or vomiting.  Pain was constant, sharp in nature and rated as 7-8/10 on pain severity.  3 Tums taken provided no relief.she decided to go to the ED for further evaluation and management.   ED Course: In the emergency department, BP was 149/71, O2 sats was 95% on supplemental oxygen  at 3 LPM.  Other vital signs are within normal range.  Workup in the ED showed normal CBC except for WBC of 51.5 with lymphocyte predominant.  BMP was normal except for blood glucose of 115.  AST 134, ALT 59, ALP 139.  Magnesium  2.2, troponin x 2 was normal.  Lipase 23 CT abdomen pelvis with contrast was suggestive of acute cholecystitis Chest x-ray showed no acute cardiopulmonary abnormality GI cocktail was provided, patient was treated with Pepcid and Dilaudid.  IV Zosyn  was given. General surgery (Dr. Evonnie) was consulted and recommended admitting patient here at [facility], antibiotics, n.p.o. at midnight, right upper quadrant ultrasound in the morning with plan for IR consultation for consideration of PCT after further imaging per EDP Oncology (NP Burns) was consulted regarding patient's leukocytosis with lymphocytosis and patient was seen in consult per EDP TRH was asked to admit patient.  Assessment and plan 1-acute cholecystitis - Continue  conservative management and supportive care - Maintain adequate hydration - Continue as needed analgesics - Continue antiemetics - Will continue IV antibiotics and supportive care -After discussing with general surgery no plan for surgical intervention with concern for underlying CLL; IR has been consulted for cholecystostomy tube placement; will follow rec's. - HIDA scan requested, and will be done later today.  2-transaminitis - Combination of provide #1 and concern for CLL - Continue treatment as mentioned above - Appreciate assistance and recommendation by hematology/oncology service.  3-leukocytosis - WBCs over 50000 range at time of admission - Looking into patient's records significant elevation in her WBCs at baseline appreciated in the low teens to almost 20,000 for over a year ago. - Questionable leukemoid reaction versus CLL - Oncology service planning to continue outpatient evaluation and further workup - Continue treatment for acute cholecystitis process as mentioned above. - Patient reports history of primary family member (patient's father) with leukemia.  4-mixed hyperlipidemia - Planning to resume statin once improvement in LFTs appreciated.  5-history of COPD - No acute exacerbation currently appreciated - Continue breztri  and as needed bronchodilators.  6-chronic respiratory failure with hypoxia - Continue oxygen  supplementation - Saturation appreciated on 2-3 L nasal cannula supplementation (patient baseline).  7-chronic diastolic heart failure - Stable and compensated - Continue to follow daily weights/strict I's and O's - Continue treatment of hypertension - Low-sodium diet discussed with patient.  8-hypertension - Continue current antihypertensive agents.  9-Class 1 obesity - Low-calorie diet and portion control discussed with patient. -Body mass index is 34.52 kg/m.    Subjective:  Patient is afebrile, in  no acute distress and reporting no  abdominal pain, no nausea, no vomiting.  She tolerated diet well on 09/01/2024.  Expressed that she is passing gas and is in no acute distress.  Physical Exam: Vitals:   09/01/24 1826 09/01/24 2123 09/02/24 0506 09/02/24 0735  BP:  133/76 (!) 119/59   Pulse:  86 86   Resp:  18 18   Temp:  98 F (36.7 C) 98.6 F (37 C)   TempSrc:  Oral Oral   SpO2: 91% 92% 91% 91%  Weight:      Height:       General exam: Alert, awake, oriented x 3; no fever, no abdominal pain, no nausea or vomiting.  Reports good tolerance of diet overnight.  Passing gas. Respiratory system: Good air movement bilaterally.  Good saturation on chronic supplementation.  No using accessory muscle. Cardiovascular system:RRR. No rubs or gallops. Gastrointestinal system: Abdomen is obese, nondistended, soft and nontender. No organomegaly or masses felt. Normal bowel sounds heard. Central nervous system: No focal neurological deficits. Extremities: No cyanosis or clubbing. Skin: No petechiae. Psychiatry: Judgement and insight appear normal. Mood & affect appropriate.   Latest data Reviewed: Comprehensive metabolic panel: Sodium 139, potassium 4.1, chloride 96, bicarb 32, BUN 12, creatinine 0.61, AST 116, ALT 122, alk phos 142, total bilirubin 0.9 and GFR >60 RAR:TARd 43.5, hemoglobin 12.0 and platelet count 181K  Family Communication: No family at bedside.  Disposition: Status is: Inpatient Remains inpatient appropriate because: Continue IV therapy.  Anticipating discharge back home once medically stable.   Time spent: 50 minutes  Author: Eric Nunnery, MD 09/02/2024 12:25 PM  For on call review [email or web address].

## 2024-09-02 NOTE — Progress Notes (Signed)
 [facility] Progress Note     Subjective: Patient seen and examined.  She is about to undergo HIDA scan.  She denies any abdominal pain for over 24 hours.  She tolerated her diet yesterday without nausea and vomiting.  She is passing a large amount of gas.  Objective: Vital signs in last 24 hours: Temp:  [98 F (36.7 C)-98.6 F (37 C)] 98.6 F (37 C) (10/08 0506) Pulse Rate:  [76-86] 86 (10/08 0506) Resp:  [16-18] 18 (10/08 0506) BP: (119-137)/(59-76) 119/59 (10/08 0506) SpO2:  [90 %-95 %] 91 % (10/08 0735) Last BM Date : 08/31/24  Intake/Output from previous day: 10/07 0701 - 10/08 0700 In: 56.1 [IV Piggyback:56.1] Out: -  Intake/Output this shift: No intake/output data recorded.  General appearance: alert, cooperative, and no distress GI: Abdomen soft, nondistended, no percussion tenderness, nontender to palpation; no rigidity, guarding, rebound tenderness; negative Murphy sign  Lab Results:  Recent Labs    08/31/24 1640 09/01/24 0334  WBC 51.5* 43.5*  HGB 12.6 12.0  HCT 40.4 38.1  PLT 188 181   BMET Recent Labs    08/31/24 1640 09/01/24 0334  NA 141 139  K 4.5 4.1  CL 99 96*  CO2 30 32  GLUCOSE 115* 97  BUN 15 12  CREATININE 0.67 0.61  CALCIUM 10.0 9.7   PT/INR No results for input(s): LABPROT, INR in the last 72 hours.  Studies/Results: US  Abdomen Limited Result Date: 09/01/2024 CLINICAL DATA:  RIGHT upper quadrant pain EXAM: ULTRASOUND ABDOMEN LIMITED RIGHT UPPER QUADRANT COMPARISON:  CT abdomen pelvis 08/31/2024 FINDINGS: Gallbladder: Gallstones: Multiple gallstones are seen measuring up to 2.7 cm Sludge: None Gallbladder Wall: Thickened measuring up to 6 mm Pericholecystic fluid: None Sonographic Murphy's Sign: Unable to assess due to pain medications. Common bile duct: Diameter: 6 mm Liver: Parenchymal echogenicity: Within normal limits Contours: Normal Lesions: None Portal vein: Patent.  Hepatopetal flow Other: None. IMPRESSION:  Cholelithiasis with gallbladder wall thickening is suspicious for acute cholecystitis. Electronically Signed   By: Aliene Lloyd M.D.   On: 09/01/2024 10:30   CT ABDOMEN PELVIS W CONTRAST Result Date: 08/31/2024 EXAM: CT ABDOMEN AND PELVIS WITH CONTRAST 08/31/2024 06:57:22 PM TECHNIQUE: CT of the abdomen and pelvis was performed with the administration of intravenous contrast, (iohexol  (OMNIPAQUE ) 300 MG/ML solution). Multiplanar reformatted images are provided for review. Automated exposure control, iterative reconstruction, and/or weight-based adjustment of the mA/kV was utilized to reduce the radiation dose to as low as reasonably achievable. COMPARISON: None available. CLINICAL HISTORY: Abdominal pain, acute, nonlocalized. Pt c/o upper abd pain x 3 hours. States he took 3 tums with no relief. FINDINGS: LOWER CHEST: No acute abnormality. LIVER: The liver is unremarkable. GALLBLADDER AND BILE DUCTS: The gallbladder is distended with multiple layering stones seen within the gallbladder neck at the origin of the cystic duct. There is superimposed pericholecystic edema, but no changes of acute on ruptured cholecystitis. SPLEEN: No acute abnormality. PANCREAS: No acute abnormality. 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 The left adrenal gland is unremarkable. KIDNEYS, URETERS AND BLADDER: No stones in the kidneys or ureters. No hydronephrosis. No perinephric or periureteral stranding. Circum aortic left renal vein. Urinary bladder is unremarkable. GI AND BOWEL: Stomach demonstrates no acute abnormality. There is no bowel obstruction. A small fat-containing umbilical hernia is noted. PERITONEUM AND RETROPERITONEUM: No ascites. No free air. VASCULATURE: Mild aortoiliac atherosclerotic calcification is present. The  abdominal vasculature is otherwise unremarkable. LYMPH NODES: No lymphadenopathy. REPRODUCTIVE ORGANS:  No acute abnormality. BONES AND SOFT TISSUES: Osseous structures are age appropriate. No acute bone abnormality. No lytic or blastic bone lesion. IMPRESSION: 1. Acute cholecystitis. Electronically signed by: Dorethia Molt MD 08/31/2024 07:22 PM EDT RP Workstation: [id number]   DG Chest Portable 1 View Result Date: 08/31/2024 CLINICAL DATA:  Upper abdominal pain EXAM: PORTABLE CHEST - 1 VIEW COMPARISON:  February 26, 2023 FINDINGS: Chronic coarsening of the pulmonary interstitium without overt pulmonary edema. No focal airspace consolidation, pleural effusion, or pneumothorax. Mild cardiomegaly. Aortic atherosclerosis. No acute fracture or destructive lesions. Multilevel thoracic osteophytosis. IMPRESSION: No acute cardiopulmonary abnormality. Electronically Signed   By: Rogelia Myers M.D.   On: 08/31/2024 17:01    Anti-infectives: Anti-infectives (From admission, onward)    Start     Dose/Rate Route Frequency Ordered Stop   09/01/24 0600  piperacillin -tazobactam (ZOSYN ) IVPB 3.375 g        3.375 g 12.5 mL/hr over 240 Minutes Intravenous Every 8 hours 08/31/24 2158     08/31/24 2000  piperacillin -tazobactam (ZOSYN ) IVPB 3.375 g        3.375 g 12.5 mL/hr over 240 Minutes Intravenous  Once 08/31/24 1952 09/01/24 0309       Assessment/Plan:  Patient is a 63 year old female who was admitted with concern for acute cholecystitis.  -Patient not currently a candidate for surgery given her new lymphocytosis and concern for CLL with her poor baseline pulmonary status. -Requested IR evaluation for percutaneous cholecystostomy tube -IR has requested HIDA scan to evaluate for cystic duct obstruction -Will await results of HIDA scan -If no evidence of cystic duct obstruction, no need for percutaneous cholecystostomy tube or surgery at this time. -Continue IV Zosyn  for now -If no plans for IR intervention today, okay to reinstitute diet post HIDA scan -Patient will need to follow-up outpatient with  oncology for further workup of her lymphocytosis -Appreciate hospitalist recommendations   LOS: 2 days    Kennie Karapetian A Arra Connaughton 09/02/2024  Note: Portions of this report may have been transcribed using voice recognition software. Every effort has been made to ensure accuracy; however, inadvertent computerized transcription errors may still be present.

## 2024-09-03 LAB — FANA STAINING PATTERNS: Speckled Pattern: 24529

## 2024-09-03 LAB — FLOW CYTOMETRY

## 2024-09-03 LAB — ANTINUCLEAR ANTIBODIES, IFA: ANA Ab, IFA: POSITIVE — AB

## 2024-09-18 LAB — SEDIMENTATION RATE: Sed Rate: 16 mm/h (ref 0–22)

## 2024-10-04 NOTE — Progress Notes (Deleted)
 Hematology-Oncology Clinic Note  [facility]   Reason for Referral: CLL  Oncology History: I have reviewed her chart and materials related to her cancer extensively and collaborated history with the patient. Summary of oncologic history is as follows:  Diagnosis: CLL  -08/31/2024: Flow cytometry: Abnormal clonal B-cell population identified.  -The B  cells are positive for CD5 (partial), CD19, CD20, CD38, CD200 and  express kappa light chains.   -The findings are consistent with a mature  B-cell lymphoma such as chronic lymphocytic leukemia/small lymphocytic  lymphoma.    History of Presenting Illness: Discussed the use of AI scribe software for clinical note transcription with the patient, who gave verbal consent to proceed.  History of Present Illness      Medical History: Past Medical History:  Diagnosis Date   COPD (chronic obstructive pulmonary disease) (HCC)    Influenza B 12/2017   Polycythemia    Seasonal allergies    Vitamin D deficiency     Surgical history: Past Surgical History:  Procedure Laterality Date   CESAREAN SECTION       Allergies:  has no known allergies.  Medications:  Current Outpatient Medications  Medication Sig Dispense Refill   albuterol  (VENTOLIN  HFA) 108 (90 Base) MCG/ACT inhaler Inhale 1-2 puffs into the lungs every 6 (six) hours as needed for wheezing or shortness of breath. (Patient taking differently: Inhale 2 puffs into the lungs every 6 (six) hours as needed for wheezing or shortness of breath.) 8 g 5   Budeson-Glycopyrrol-Formoterol  (BREZTRI  AEROSPHERE) 160-9-4.8 MCG/ACT AERO Inhale 2 puffs into the lungs in the morning and at bedtime. 10.7 g 5   carvedilol  (COREG ) 3.125 MG tablet Take 1 tablet (3.125 mg total) by mouth 2 (two) times daily with a meal. 60 tablet 0   cetirizine (ZYRTEC) 10 MG tablet Take 10 mg by mouth daily.     ergocalciferol (VITAMIN D2) 1.25 MG (50000 UT) capsule Take 50,000  Units by mouth every Sunday.     gabapentin  (NEURONTIN ) 100 MG capsule Take 100 mg by mouth 3 (three) times daily as needed (for pain).      ipratropium-albuterol  (DUONEB) 0.5-2.5 (3) MG/3ML SOLN Take 3 mLs by nebulization in the morning, at noon, and at bedtime. (Patient taking differently: Take 3 mLs by nebulization every 4 (four) hours.) 360 mL 5   OXYGEN  Inhale 3-4 L into the lungs continuous.     pantoprazole  (PROTONIX ) 40 MG tablet Take 1 tablet (40 mg total) by mouth daily. 30 tablet 1   pravastatin (PRAVACHOL) 20 MG tablet Take 20 mg by mouth daily.     No current facility-administered medications for this visit.    Review of Systems: Constitutional: Denies fevers, chills or abnormal night sweats Eyes: Denies blurriness of vision, double vision or watery eyes Ears, nose, mouth, throat, and face: Denies mucositis or sore throat Respiratory: Denies cough, dyspnea or wheezes Cardiovascular: Denies palpitation, chest discomfort or lower extremity swelling Gastrointestinal:  Denies nausea, heartburn or change in bowel habits Skin: Denies abnormal skin rashes Lymphatics: Denies new lymphadenopathy or easy bruising Neurological:Denies numbness, tingling or new weaknesses Behavioral/Psych: Mood is stable, no new changes  All other systems were reviewed with the patient and are negative.  Physical Examination: ECOG PERFORMANCE STATUS: {CHL ONC ECOG PS:[id number]}  There were no vitals filed for this visit. There were no vitals filed for this visit.  GENERAL:alert, no distress and comfortable SKIN: skin color, texture, turgor are normal, no rashes or significant lesions EYES:  normal, conjunctiva are pink and non-injected, sclera clear OROPHARYNX:no exudate, no erythema and lips, buccal mucosa, and tongue normal  NECK: supple, thyroid normal size, non-tender, without nodularity LYMPH:  no palpable lymphadenopathy in the cervical, axillary or inguinal LUNGS: clear to auscultation and  percussion with normal breathing effort HEART: regular rate & rhythm and no murmurs and no lower extremity edema ABDOMEN:abdomen soft, non-tender and normal bowel sounds Musculoskeletal:no cyanosis of digits and no clubbing  PSYCH: alert & oriented x 3 with fluent speech NEURO: no focal motor/sensory deficits   Laboratory Data: I have reviewed the data as listed Lab Results  Component Value Date   WBC 43.5 (H) 09/01/2024   HGB 12.0 09/01/2024   HCT 38.1 09/01/2024   MCV 98.4 09/01/2024   PLT 181 09/01/2024   Recent Labs    08/31/24 1640 09/01/24 0334  NA 141 139  K 4.5 4.1  CL 99 96*  CO2 30 32  GLUCOSE 115* 97  BUN 15 12  CREATININE 0.67 0.61  CALCIUM 10.0 9.7  GFRNONAA >60 >60  PROT 6.6 6.4*  ALBUMIN 4.0 4.0  AST 134* 116*  ALT 59* 122*  ALKPHOS 139* 142*  BILITOT 0.7 0.9  BILIDIR 0.2  --   IBILI 0.5  --     Radiographic Studies: I have personally reviewed the radiological images as listed and agreed with the findings in the report.  NM Hepatobiliary Liver Func CLINICAL DATA:  Concern for cholecystitis by CT and ultrasound  EXAM: NUCLEAR MEDICINE HEPATOBILIARY IMAGING  TECHNIQUE: Sequential images of the abdomen were obtained out to 60 minutes following intravenous administration of radiopharmaceutical.  RADIOPHARMACEUTICALS:  5.0 mCi Tc-61m  Choletec IV  COMPARISON:  08/31/2024, 09/01/2024  FINDINGS: Prompt uptake and biliary excretion of activity by the liver is seen. Gallbladder activity is visualized, consistent with patency of cystic duct. Biliary activity passes into small bowel, consistent with patent common bile duct.  IMPRESSION: Normal hepatobiliary scan.  Patent common bile duct and cystic duct.  Electronically Signed   By: CHRISTELLA.  Shick M.D.   On: 09/02/2024 14:12    ASSESSMENT & PLAN:  Patient is a 63 y.o. female presenting for CLL  Assessment and Plan Assessment & Plan     No orders of the defined types were placed in this  encounter.   The total time spent in the appointment was {CHL ONC TIME VISIT - DTPQU:[id number]} encounter with patients including review of chart and various tests results, discussions about plan of care and coordination of care plan   All questions were answered. The patient knows to call the clinic with any problems, questions or concerns. No barriers to learning was detected.  Mickiel Dry, MD 11/9/20259:08 PM

## 2024-10-05 ENCOUNTER — Inpatient Hospital Stay

## 2024-10-05 ENCOUNTER — Inpatient Hospital Stay: Admitting: Oncology

## 2024-10-08 ENCOUNTER — Inpatient Hospital Stay

## 2024-10-08 ENCOUNTER — Inpatient Hospital Stay: Attending: Oncology | Admitting: Oncology

## 2024-10-08 VITALS — BP 129/70 | HR 95 | Temp 98.0°F | Resp 22 | Wt 223.8 lb

## 2024-10-08 DIAGNOSIS — C9110 Chronic lymphocytic leukemia of B-cell type not having achieved remission: Secondary | ICD-10-CM

## 2024-10-08 DIAGNOSIS — D751 Secondary polycythemia: Secondary | ICD-10-CM | POA: Diagnosis not present

## 2024-10-08 DIAGNOSIS — Z79899 Other long term (current) drug therapy: Secondary | ICD-10-CM | POA: Insufficient documentation

## 2024-10-08 DIAGNOSIS — Z87891 Personal history of nicotine dependence: Secondary | ICD-10-CM | POA: Insufficient documentation

## 2024-10-08 LAB — CBC WITH DIFFERENTIAL/PLATELET
Abs Immature Granulocytes: 0.12 K/uL — ABNORMAL HIGH (ref 0.00–0.07)
Basophils Absolute: 0.1 K/uL (ref 0.0–0.1)
Basophils Relative: 0 %
Eosinophils Absolute: 0.2 K/uL (ref 0.0–0.5)
Eosinophils Relative: 0 %
HCT: 40.4 % (ref 36.0–46.0)
Hemoglobin: 12.9 g/dL (ref 12.0–15.0)
Immature Granulocytes: 0 %
Lymphocytes Relative: 91 %
Lymphs Abs: 51.2 K/uL — ABNORMAL HIGH (ref 0.7–4.0)
MCH: 31.6 pg (ref 26.0–34.0)
MCHC: 31.9 g/dL (ref 30.0–36.0)
MCV: 99 fL (ref 80.0–100.0)
Monocytes Absolute: 0.6 K/uL (ref 0.1–1.0)
Monocytes Relative: 1 %
Neutro Abs: 4.4 K/uL (ref 1.7–7.7)
Neutrophils Relative %: 8 %
Platelets: 242 K/uL (ref 150–400)
RBC: 4.08 MIL/uL (ref 3.87–5.11)
RDW: 12 % (ref 11.5–15.5)
Smear Review: NORMAL
WBC: 56.6 K/uL (ref 4.0–10.5)
nRBC: 0 % (ref 0.0–0.2)

## 2024-10-08 LAB — COMPREHENSIVE METABOLIC PANEL WITH GFR
ALT: 20 U/L (ref 0–44)
AST: 21 U/L (ref 15–41)
Albumin: 4.4 g/dL (ref 3.5–5.0)
Alkaline Phosphatase: 103 U/L (ref 38–126)
Anion gap: 8 (ref 5–15)
BUN: 10 mg/dL (ref 8–23)
CO2: 34 mmol/L — ABNORMAL HIGH (ref 22–32)
Calcium: 9.6 mg/dL (ref 8.9–10.3)
Chloride: 100 mmol/L (ref 98–111)
Creatinine, Ser: 0.59 mg/dL (ref 0.44–1.00)
GFR, Estimated: >60 mL/min (ref >60)
Glucose, Bld: 85 mg/dL (ref 70–99)
Potassium: 4.6 mmol/L (ref 3.5–5.1)
Sodium: 142 mmol/L (ref 135–145)
Total Bilirubin: 0.5 mg/dL (ref 0.0–1.2)
Total Protein: 7 g/dL (ref 6.5–8.1)

## 2024-10-08 LAB — URIC ACID: Uric Acid, Serum: 6.1 mg/dL (ref 2.5–7.1)

## 2024-10-08 LAB — LACTATE DEHYDROGENASE: LDH: 171 U/L (ref 105–235)

## 2024-10-08 NOTE — Progress Notes (Signed)
 CRITICAL VALUE ALERT Critical value received:  WBC 56.6 Date of notification:  10-08-24 Time of notification: 1455 Critical value read back:  Yes.   Nurse who received alert:  C. Arrin Pintor RN MD notified time and response:  Dr. Davonna, no new orders

## 2024-10-08 NOTE — Progress Notes (Signed)
 Hematology-Oncology Clinic Note  [facility]   Reason for Referral: CLL  Oncology History: I have reviewed her chart and materials related to her cancer extensively and collaborated history with the patient. Summary of oncologic history is as follows:  Diagnosis: CLL  -08/31/2024: Flow cytometry: Abnormal clonal B-cell population identified.  -The B  cells are positive for CD5 (partial), CD19, CD20, CD38, CD200 and  express kappa light chains.   -The findings are consistent with a mature  B-cell lymphoma such as chronic lymphocytic leukemia/small lymphocytic  lymphoma.    History of Presenting Illness: Discussed the use of AI scribe software for clinical note transcription with the patient, who gave verbal consent to proceed.  History of Present Illness Emily Fitzgerald is a 63 year old female who presents today with leukocytosis and recent diagnosis of CLL.  She was previously following at [facility] for polycythemia.  Her white blood cell count has increased from 19,000 in March of last year to 43,000 recently. No fatigue, fever, chills, or significant weight loss, but she experiences night sweats.  She has a history of polycythemia. Her hemoglobin has decreased from 17 to 12 over time. She quit smoking a year ago and slight weight gain since cessation.  She recalls having cholecystitis during a hospital stay, which also contributed to an elevated white blood cell count. No current abdominal pain, attributing past discomfort to heartburn, which resolved with medication.  She is active in her daily life, performing household chores such as cooking and cleaning.  She is overall doing very well.   Medical History: Past Medical History:  Diagnosis Date   COPD (chronic obstructive pulmonary disease) (HCC)    Influenza B 12/2017   Polycythemia    Seasonal allergies    Vitamin D deficiency     Surgical history: Past Surgical History:  Procedure  Laterality Date   CESAREAN SECTION       Allergies:  has no known allergies.  Medications:  Current Outpatient Medications  Medication Sig Dispense Refill   albuterol  (VENTOLIN  HFA) 108 (90 Base) MCG/ACT inhaler Inhale 1-2 puffs into the lungs every 6 (six) hours as needed for wheezing or shortness of breath. (Patient taking differently: Inhale 2 puffs into the lungs every 6 (six) hours as needed for wheezing or shortness of breath.) 8 g 5   Budeson-Glycopyrrol-Formoterol  (BREZTRI  AEROSPHERE) 160-9-4.8 MCG/ACT AERO Inhale 2 puffs into the lungs in the morning and at bedtime. 10.7 g 5   carvedilol  (COREG ) 3.125 MG tablet Take 1 tablet (3.125 mg total) by mouth 2 (two) times daily with a meal. 60 tablet 0   cetirizine (ZYRTEC) 10 MG tablet Take 10 mg by mouth daily.     ergocalciferol (VITAMIN D2) 1.25 MG (50000 UT) capsule Take 50,000 Units by mouth every Sunday.     gabapentin  (NEURONTIN ) 100 MG capsule Take 100 mg by mouth 3 (three) times daily as needed (for pain).      ipratropium-albuterol  (DUONEB) 0.5-2.5 (3) MG/3ML SOLN Take 3 mLs by nebulization in the morning, at noon, and at bedtime. (Patient taking differently: Take 3 mLs by nebulization every 4 (four) hours.) 360 mL 5   levocetirizine (XYZAL) 5 MG tablet SMARTSIG:1 Tablet(s) By Mouth Every Evening     OXYGEN  Inhale 3-4 L into the lungs continuous.     pantoprazole  (PROTONIX ) 40 MG tablet Take 1 tablet (40 mg total) by mouth daily. 30 tablet 1   pravastatin  (PRAVACHOL ) 20 MG tablet Take 20 mg by mouth  daily.     No current facility-administered medications for this visit.    Review of Systems: Constitutional: Denies fevers, chills or abnormal night sweats Eyes: Denies blurriness of vision, double vision or watery eyes Ears, nose, mouth, throat, and face: Denies mucositis or sore throat Respiratory: Denies cough, dyspnea or wheezes Cardiovascular: Denies palpitation, chest discomfort or lower extremity  swelling Gastrointestinal:  Denies nausea, heartburn or change in bowel habits Skin: Denies abnormal skin rashes Lymphatics: Denies new lymphadenopathy or easy bruising Neurological:Denies numbness, tingling or new weaknesses Behavioral/Psych: Mood is stable, no new changes   All other systems were reviewed with the patient and are negative.  Physical Examination: ECOG PERFORMANCE STATUS: 0 - Asymptomatic  Vitals:   10/08/24 1343 10/08/24 1348  BP: (!) 86/66 129/70  Pulse: 95   Resp: (!) 22   Temp: 98 F (36.7 C)   SpO2: 95%    Filed Weights   10/08/24 1343  Weight: 223 lb 12.3 oz (101.5 kg)    GENERAL:alert, no distress and comfortable SKIN: skin color, texture, turgor are normal, no rashes or significant lesions LYMPH:  no palpable lymphadenopathy in the cervical, axillary or inguinal LUNGS: clear to auscultation and percussion with normal breathing effort HEART: regular rate & rhythm and no murmurs and no lower extremity edema ABDOMEN:abdomen soft, non-tender and normal bowel sounds Musculoskeletal:no cyanosis of digits and no clubbing  PSYCH: alert & oriented x 3 with fluent speech  Laboratory Data: I have reviewed the data as listed Lab Results  Component Value Date   WBC 56.6 (HH) 10/08/2024   HGB 12.9 10/08/2024   HCT 40.4 10/08/2024   MCV 99.0 10/08/2024   PLT 242 10/08/2024   Recent Labs    08/31/24 1640 09/01/24 0334 10/08/24 1411  NA 141 139 142  K 4.5 4.1 4.6  CL 99 96* 100  CO2 30 32 34*  GLUCOSE 115* 97 85  BUN 15 12 10   CREATININE 0.67 0.61 0.59  CALCIUM 10.0 9.7 9.6  GFRNONAA >60 >60 >60  PROT 6.6 6.4* 7.0  ALBUMIN 4.0 4.0 4.4  AST 134* 116* 21  ALT 59* 122* 20  ALKPHOS 139* 142* 103  BILITOT 0.7 0.9 0.5  BILIDIR 0.2  --   --   IBILI 0.5  --   --     Radiographic Studies: I have personally reviewed the radiological images as listed and agreed with the findings in the report.  NM Hepatobiliary Liver Func CLINICAL DATA:  Concern  for cholecystitis by CT and ultrasound  EXAM: NUCLEAR MEDICINE HEPATOBILIARY IMAGING  TECHNIQUE: Sequential images of the abdomen were obtained out to 60 minutes following intravenous administration of radiopharmaceutical.  RADIOPHARMACEUTICALS:  5.0 mCi Tc-32m  Choletec  IV  COMPARISON:  08/31/2024, 09/01/2024  FINDINGS: Prompt uptake and biliary excretion of activity by the liver is seen. Gallbladder activity is visualized, consistent with patency of cystic duct. Biliary activity passes into small bowel, consistent with patent common bile duct.  IMPRESSION: Normal hepatobiliary scan.  Patent common bile duct and cystic duct.  Electronically Signed   By: CHRISTELLA.  Shick M.D.   On: 09/02/2024 14:12    ASSESSMENT & PLAN:  Patient is a 63 y.o. female presenting for CLL  Assessment and Plan Assessment & Plan Chronic lymphocytic leukemia, B-cell type Chronic lymphocytic leukemia with elevated WBC count, increased from 19 to 43.  RAI Stage : 0, Asymptomatic with stable hemoglobin levels. Previous polycythemia likely related to COPD and smoking.  -Will repeat new blood work today  to obtain a baseline.  Previous significant elevation in WBC also likely contributed by cholecystitis. -No B symptoms, RAI stage 0, no anemia and thrombocytopenia.  Does not meet criteria for treatment at this time  Return to clinic in 2 months with labs  Secondary polycythemia COPD with previous polycythemia likely secondary to smoking.  Patient previously received phlebotomy.  Smoking cessation maintained for over a year.    Orders Placed This Encounter  Procedures   CBC with Differential/Platelet    Standing Status:   Future    Number of Occurrences:   1    Expected Date:   10/08/2024    Expiration Date:   01/06/2025   Comprehensive metabolic panel with GFR    Standing Status:   Future    Number of Occurrences:   1    Expected Date:   10/08/2024    Expiration Date:   01/06/2025   Lactate  dehydrogenase    Standing Status:   Future    Number of Occurrences:   1    Expected Date:   10/08/2024    Expiration Date:   01/06/2025   Uric acid    Standing Status:   Future    Number of Occurrences:   1    Expected Date:   10/08/2024    Expiration Date:   01/06/2025    The total time spent in the appointment was 40 minutes encounter with patients including review of chart and various tests results, discussions about plan of care and coordination of care plan   All questions were answered. The patient knows to call the clinic with any problems, questions or concerns. No barriers to learning was detected.  Mickiel Dry, MD 11/28/20259:55 AM

## 2024-12-09 ENCOUNTER — Inpatient Hospital Stay: Attending: Oncology

## 2024-12-09 DIAGNOSIS — C9110 Chronic lymphocytic leukemia of B-cell type not having achieved remission: Secondary | ICD-10-CM

## 2024-12-09 LAB — COMPREHENSIVE METABOLIC PANEL WITH GFR
ALT: 19 U/L (ref 0–44)
AST: 21 U/L (ref 15–41)
Albumin: 4.2 g/dL (ref 3.5–5.0)
Alkaline Phosphatase: 109 U/L (ref 38–126)
Anion gap: 12 (ref 5–15)
BUN: 10 mg/dL (ref 8–23)
CO2: 29 mmol/L (ref 22–32)
Calcium: 9.6 mg/dL (ref 8.9–10.3)
Chloride: 102 mmol/L (ref 98–111)
Creatinine, Ser: 0.6 mg/dL (ref 0.44–1.00)
GFR, Estimated: >60 mL/min
Glucose, Bld: 68 mg/dL — ABNORMAL LOW (ref 70–99)
Potassium: 4.3 mmol/L (ref 3.5–5.1)
Sodium: 143 mmol/L (ref 135–145)
Total Bilirubin: 0.4 mg/dL (ref 0.0–1.2)
Total Protein: 7.2 g/dL (ref 6.5–8.1)

## 2024-12-09 LAB — CBC WITH DIFFERENTIAL/PLATELET
Basophils Absolute: 0 K/uL (ref 0.0–0.1)
Basophils Relative: 0 %
Eosinophils Absolute: 0.6 K/uL — ABNORMAL HIGH (ref 0.0–0.5)
Eosinophils Relative: 1 %
HCT: 43.3 % (ref 36.0–46.0)
Hemoglobin: 13.4 g/dL (ref 12.0–15.0)
Lymphocytes Relative: 85 %
Lymphs Abs: 53.2 K/uL — ABNORMAL HIGH (ref 0.7–4.0)
MCH: 30.6 pg (ref 26.0–34.0)
MCHC: 30.9 g/dL (ref 30.0–36.0)
MCV: 98.9 fL (ref 80.0–100.0)
Monocytes Absolute: 0 K/uL — ABNORMAL LOW (ref 0.1–1.0)
Monocytes Relative: 0 %
Neutro Abs: 8.8 K/uL — ABNORMAL HIGH (ref 1.7–7.7)
Neutrophils Relative %: 14 %
Platelets: 208 K/uL (ref 150–400)
RBC: 4.38 MIL/uL (ref 3.87–5.11)
RDW: 11.4 % — ABNORMAL LOW (ref 11.5–15.5)
Smear Review: NORMAL
WBC: 62.6 K/uL (ref 4.0–10.5)
nRBC: 0 % (ref 0.0–0.2)

## 2024-12-09 LAB — URIC ACID: Uric Acid, Serum: 4.8 mg/dL (ref 2.5–7.1)

## 2024-12-09 LAB — LACTATE DEHYDROGENASE: LDH: 195 U/L (ref 105–235)

## 2024-12-09 NOTE — Progress Notes (Signed)
 CRITICAL VALUE ALERT Critical value received:  WBC 62.6 K. Date of notification:  12-09-2024 Time of notification: 15:15 pm Critical value read back:  Yes.   Nurse who received alert:  B.Taliah Porche RN.  MD notified time and response:  Dr. Davonna / A.Lenon PEAK.

## 2024-12-09 NOTE — Progress Notes (Unsigned)
 CRITICAL VALUE ALERT Critical value received:  WBC 62.6 K. Date of notification:  12-09-2024 Time of notification: 15:15 pm Critical value read back:  Yes.   Nurse who received alert:  B.Taliah Porche RN.  MD notified time and response:  Dr. Davonna / A.Lenon PEAK.

## 2024-12-16 ENCOUNTER — Inpatient Hospital Stay: Admitting: Oncology
